# Patient Record
Sex: Female | Born: 1955 | Race: Black or African American | Hispanic: No | Marital: Married | Smoking: Current every day smoker
Health system: Southern US, Community
[De-identification: ages and names within clinical notes are randomized; demographics above are authoritative.]

## PROBLEM LIST (undated history)

## (undated) DIAGNOSIS — R112 Nausea with vomiting, unspecified: Secondary | ICD-10-CM

## (undated) DIAGNOSIS — T7840XA Allergy, unspecified, initial encounter: Secondary | ICD-10-CM

## (undated) DIAGNOSIS — E785 Hyperlipidemia, unspecified: Secondary | ICD-10-CM

## (undated) DIAGNOSIS — Z9889 Other specified postprocedural states: Secondary | ICD-10-CM

## (undated) DIAGNOSIS — M1990 Unspecified osteoarthritis, unspecified site: Secondary | ICD-10-CM

## (undated) DIAGNOSIS — I10 Essential (primary) hypertension: Secondary | ICD-10-CM

## (undated) HISTORY — DX: Essential (primary) hypertension: I10

## (undated) HISTORY — PX: TUBAL LIGATION: SHX77

## (undated) HISTORY — PX: DILATION AND CURETTAGE OF UTERUS: SHX78

## (undated) HISTORY — PX: JOINT REPLACEMENT: SHX530

## (undated) HISTORY — PX: REPLACEMENT TOTAL KNEE: SUR1224

## (undated) HISTORY — DX: Allergy, unspecified, initial encounter: T78.40XA

## (undated) HISTORY — DX: Hyperlipidemia, unspecified: E78.5

---

## 1989-02-16 HISTORY — PX: TOTAL THYROIDECTOMY: SHX2547

## 1997-11-10 DIAGNOSIS — D269 Other benign neoplasm of uterus, unspecified: Secondary | ICD-10-CM

## 2002-05-28 DIAGNOSIS — E7800 Pure hypercholesterolemia, unspecified: Secondary | ICD-10-CM

## 2004-04-24 ENCOUNTER — Ambulatory Visit: Payer: Self-pay

## 2004-06-13 ENCOUNTER — Ambulatory Visit: Payer: Self-pay | Admitting: Gastroenterology

## 2008-02-17 DIAGNOSIS — E669 Obesity, unspecified: Secondary | ICD-10-CM

## 2008-02-17 DIAGNOSIS — Z720 Tobacco use: Secondary | ICD-10-CM

## 2009-05-09 ENCOUNTER — Ambulatory Visit: Payer: Self-pay | Admitting: Orthopedic Surgery

## 2009-05-19 ENCOUNTER — Inpatient Hospital Stay: Payer: Self-pay | Admitting: Orthopedic Surgery

## 2009-11-09 DIAGNOSIS — E559 Vitamin D deficiency, unspecified: Secondary | ICD-10-CM | POA: Insufficient documentation

## 2011-02-07 ENCOUNTER — Emergency Department: Payer: Self-pay | Admitting: *Deleted

## 2011-02-22 ENCOUNTER — Ambulatory Visit: Payer: Self-pay | Admitting: Family Medicine

## 2012-03-13 ENCOUNTER — Ambulatory Visit: Payer: Self-pay | Admitting: Family Medicine

## 2012-03-13 LAB — HM PAP SMEAR: HM PAP: NEGATIVE

## 2012-04-02 ENCOUNTER — Ambulatory Visit: Payer: Self-pay | Admitting: Orthopedic Surgery

## 2012-04-29 ENCOUNTER — Encounter (HOSPITAL_COMMUNITY): Payer: Self-pay | Admitting: Pharmacy Technician

## 2012-04-29 ENCOUNTER — Other Ambulatory Visit: Payer: Self-pay | Admitting: Orthopedic Surgery

## 2012-05-01 ENCOUNTER — Encounter (HOSPITAL_COMMUNITY): Payer: Self-pay

## 2012-05-01 ENCOUNTER — Encounter (HOSPITAL_COMMUNITY)
Admission: RE | Admit: 2012-05-01 | Discharge: 2012-05-01 | Disposition: A | Discharge status: Home / Self Care | Payer: BC Managed Care – PPO | Source: Ambulatory Visit | Attending: Orthopedic Surgery | Admitting: Orthopedic Surgery

## 2012-05-01 HISTORY — DX: Nausea with vomiting, unspecified: R11.2

## 2012-05-01 HISTORY — DX: Unspecified osteoarthritis, unspecified site: M19.90

## 2012-05-01 HISTORY — DX: Other specified postprocedural states: Z98.890

## 2012-05-01 LAB — CBC WITH DIFFERENTIAL/PLATELET
Basophils Relative: 0 % (ref 0–1)
Hemoglobin: 14.1 g/dL (ref 12.0–15.0)
Lymphocytes Relative: 42 % (ref 12–46)
Lymphs Abs: 1.9 10*3/uL (ref 0.7–4.0)
MCHC: 34.1 g/dL (ref 30.0–36.0)
Monocytes Relative: 12 % (ref 3–12)
Neutro Abs: 1.9 10*3/uL (ref 1.7–7.7)
Neutrophils Relative %: 43 % (ref 43–77)
RBC: 4.44 MIL/uL (ref 3.87–5.11)
WBC: 4.5 10*3/uL (ref 4.0–10.5)

## 2012-05-01 LAB — URINALYSIS, ROUTINE W REFLEX MICROSCOPIC
Glucose, UA: NEGATIVE mg/dL
Hgb urine dipstick: NEGATIVE
Ketones, ur: NEGATIVE mg/dL
Leukocytes, UA: NEGATIVE
Protein, ur: NEGATIVE mg/dL

## 2012-05-01 LAB — BASIC METABOLIC PANEL
GFR calc Af Amer: >90 mL/min (ref >90)
GFR calc non Af Amer: >90 mL/min (ref >90)
Potassium: 3.9 mEq/L (ref 3.5–5.1)
Sodium: 139 mEq/L (ref 135–145)

## 2012-05-01 LAB — TYPE AND SCREEN: ABO/RH(D): B POS

## 2012-05-01 LAB — PROTIME-INR: INR: 1.03 (ref 0.00–1.49)

## 2012-05-01 LAB — SURGICAL PCR SCREEN
MRSA, PCR: NEGATIVE
Staphylococcus aureus: NEGATIVE

## 2012-05-01 LAB — APTT: aPTT: 38 seconds — ABNORMAL HIGH (ref 24–37)

## 2012-05-01 NOTE — Pre-Procedure Instructions (Signed)
20 Cathy Townsend  05/01/2012   Your procedure is scheduled on:  Monday, November 18th  Report to [facility] at 0800 AM.  Call this number if you have problems the morning of surgery: [phone]   Remember:   Do not eat food or drink:After Midnight.   Take these medicines the morning of surgery with A SIP OF WATER: vicodin if needed   Do not wear jewelry, make-up or nail polish.  Do not wear lotions, powders, or perfumes.  Do not shave 48 hours prior to surgery.   Do not bring valuables to the hospital.  Contacts, dentures or bridgework may not be worn into surgery.  Leave suitcase in the car. After surgery it may be brought to your room.  For patients admitted to the hospital, checkout time is 11:00 AM the day of discharge.   Patients discharged the day of surgery will not be allowed to drive home.   Special Instructions: Shower using CHG 2 nights before surgery and the night before surgery.  If you shower the day of surgery use CHG.  Use special wash - you have one bottle of CHG for all showers.  You should use approximately 1/3 of the bottle for each shower.   Please read over the following fact sheets that you were given: Pain Booklet, Coughing and Deep Breathing, Blood Transfusion Information, MRSA Information and Surgical Site Infection Prevention

## 2012-05-01 NOTE — H&P (Signed)
TOTAL KNEE REVISION ADMISSION H&P  Patient is being admitted for right revision total knee arthroplasty.  Subjective:  Chief Complaint:right knee pain.  HPI: Cathy Townsend, 56 y.o. female, has a history of pain and functional disability in the right knee(s) due to failed previous arthroplasty and patient has failed non-surgical conservative treatments for greater than 12 weeks to include NSAID's and/or analgesics, use of assistive devices and activity modification. The indications for the revision of the total knee arthroplasty are loosening of one or more components. Onset of symptoms was gradual starting several months ago with gradually worsening course since that time.  Prior procedures on the right knee(s) include arthroplasty.  Patient currently rates pain in the right knee(s) at 7 out of 10 with activity. There is worsening of pain with activity and weight bearing.  Patient has evidence of joint subluxation and prosthetic loosening by imaging studies. This condition presents safety issues increasing the risk of falls.  There is no current active infection.  There are no active problems to display for this patient.  No past medical history on file.  No past surgical history on file.  No prescriptions prior to admission   No Known Allergies  History  Substance Use Topics  . Smoking status: Not on file  . Smokeless tobacco: Not on file  . Alcohol Use: Not on file    No family history on file.    Review of Systems  Constitutional: Negative.   HENT: Negative.   Eyes: Negative.   Respiratory: Negative.   Cardiovascular: Negative.   Gastrointestinal: Negative.   Musculoskeletal: Positive for joint pain.  Skin: Negative.   Neurological: Negative.   Endo/Heme/Allergies: Negative.   Psychiatric/Behavioral: Negative.      Objective:  Physical Exam  Constitutional: She is oriented to person, place, and time. She appears well-developed and well-nourished.  HENT:  Head:  Normocephalic.  Eyes: Pupils are equal, round, and reactive to light.  Cardiovascular: Normal heart sounds.   Respiratory: Breath sounds normal.  Musculoskeletal:       Right knee: She exhibits decreased range of motion, effusion and abnormal alignment.  Neurological: She is alert and oriented to person, place, and time.  Psychiatric: She has a normal mood and affect.    Vital signs in last 24 hours:    Labs:  There is no height or weight on file to calculate BMI.  Imaging Review Plain radiographs demonstrate the overall alignment of the right knee is mild varus.There is evidence of loosening of the tibial components. The bone quality appears to be adequate for age and reported activity level.  Assessment/Plan:  End stage arthritis, right knee(s) with failed previous arthroplasty.   The patient history, physical examination, clinical judgment of the provider and imaging studies are consistent with end stage degenerative joint disease of the  Right knee(s), previous total knee arthroplasty. Revision total knee arthroplasty is deemed medically necessary. The treatment options including medical management, injection therapy, arthroscopy and revision arthroplasty were discussed at length. The risks and benefits of revision total knee arthroplasty were presented and reviewed. The risks due to aseptic loosening, infection, stiffness, patella tracking problems, thromboembolic complications and other imponderables were discussed. The patient acknowledged the explanation, agreed to proceed with the plan and consent was signed. Patient is being admitted for inpatient treatment for surgery, pain control, PT, OT, prophylactic antibiotics, VTE prophylaxis, progressive ambulation and ADL's and discharge planning.The patient is planning to be discharged home with home health services

## 2012-05-01 NOTE — Progress Notes (Signed)
Primary Physician - Dr. Huston Foley [facility] Does not have a cardiologist - No prior cardiac testing

## 2012-05-02 ENCOUNTER — Encounter (HOSPITAL_COMMUNITY): Payer: Self-pay | Admitting: Vascular Surgery

## 2012-05-02 NOTE — Consult Note (Signed)
Anesthesia chart review: Patient is a 56 year old female scheduled for right total knee revision by Dr. Turner Daniels on 05/05/2012. History includes smoking, obesity, postoperative nausea/vomiting, prior right total knee replacement '10, arthritis, prior thyroid surgery (unspecified).  She does take HCTZ PRN for LE edema.  Her PCP is Liane Comber, FNP at [facility], last visit 03/13/12 for CPE.  Preoperative labs noted.  Chest x-ray on 05/01/2012 showed no active disease with degenerative changes of the thoracic spine.  EKG on 05/01/2012 showed normal sinus rhythm, possible left atrial enlargement, possible anterior infarct, age undetermined. Her R. wave is lower in V3 otherwise her EKG does not appear significantly changed from 03/13/2012 (PCP).  No CV symptoms were documented at her PAT visit.  She has no known history of HTN, CAD, or DM. She'll be evaluated by her anesthesiologist on the day of surgery, but if she remains asymptomatic from a CV standpoint then  anticipate she can proceed as planned.  Shonna Chock, PA-C

## 2012-05-04 MED ORDER — CHLORHEXIDINE GLUCONATE 4 % EX LIQD: 60.0000 mL | Freq: Once | CUTANEOUS | Status: DC

## 2012-05-04 MED ORDER — DEXTROSE-NACL 5-0.45 % IV SOLN: INTRAVENOUS | Status: DC

## 2012-05-04 MED ORDER — CEFAZOLIN SODIUM-DEXTROSE 2-3 GM-% IV SOLR
2.0000 g | INTRAVENOUS | Status: AC
  Administered 2012-05-05: 2 g via INTRAVENOUS

## 2012-05-05 ENCOUNTER — Encounter (HOSPITAL_COMMUNITY): Payer: Self-pay | Admitting: Vascular Surgery

## 2012-05-05 ENCOUNTER — Inpatient Hospital Stay (HOSPITAL_COMMUNITY): Payer: BC Managed Care – PPO

## 2012-05-05 ENCOUNTER — Inpatient Hospital Stay (HOSPITAL_COMMUNITY)
Admission: RE | Admit: 2012-05-05 | Discharge: 2012-05-07 | DRG: 471 | Disposition: A | Discharge status: Home Health | Payer: BC Managed Care – PPO | Source: Ambulatory Visit | Attending: Orthopedic Surgery | Admitting: Orthopedic Surgery

## 2012-05-05 ENCOUNTER — Inpatient Hospital Stay (HOSPITAL_COMMUNITY): Payer: BC Managed Care – PPO | Admitting: Vascular Surgery

## 2012-05-05 ENCOUNTER — Encounter (HOSPITAL_COMMUNITY)
Admission: RE | Disposition: A | Discharge status: Home / Self Care | Payer: Self-pay | Source: Ambulatory Visit | Attending: Orthopedic Surgery

## 2012-05-05 DIAGNOSIS — M129 Arthropathy, unspecified: Secondary | ICD-10-CM | POA: Diagnosis present

## 2012-05-05 DIAGNOSIS — F17200 Nicotine dependence, unspecified, uncomplicated: Secondary | ICD-10-CM | POA: Diagnosis present

## 2012-05-05 DIAGNOSIS — T84039A Mechanical loosening of unspecified internal prosthetic joint, initial encounter: Principal | ICD-10-CM | POA: Diagnosis present

## 2012-05-05 DIAGNOSIS — Z01812 Encounter for preprocedural laboratory examination: Secondary | ICD-10-CM

## 2012-05-05 DIAGNOSIS — Z6982 Encounter for mental health services for perpetrator of other abuse: Secondary | ICD-10-CM

## 2012-05-05 DIAGNOSIS — Z96659 Presence of unspecified artificial knee joint: Secondary | ICD-10-CM

## 2012-05-05 DIAGNOSIS — T84012A Broken internal right knee prosthesis, initial encounter: Secondary | ICD-10-CM

## 2012-05-05 DIAGNOSIS — Y831 Surgical operation with implant of artificial internal device as the cause of abnormal reaction of the patient, or of later complication, without mention of misadventure at the time of the procedure: Secondary | ICD-10-CM | POA: Diagnosis present

## 2012-05-05 HISTORY — PX: TOTAL KNEE REVISION: SHX996

## 2012-05-05 SURGERY — TOTAL KNEE REVISION
Anesthesia: General | Site: Knee | Laterality: Right | Wound class: Clean

## 2012-05-05 MED ORDER — MAGNESIUM HYDROXIDE 400 MG/5ML PO SUSP: 30.0000 mL | Freq: Every day | ORAL | Status: DC | PRN

## 2012-05-05 MED ORDER — METOCLOPRAMIDE HCL 10 MG PO TABS: 5.0000 mg | ORAL_TABLET | Freq: Three times a day (TID) | ORAL | Status: DC | PRN

## 2012-05-05 MED ORDER — ACETAMINOPHEN 10 MG/ML IV SOLN
1000.0000 mg | Freq: Four times a day (QID) | INTRAVENOUS | Status: AC
  Administered 2012-05-05 – 2012-05-06 (×4): 1000 mg via INTRAVENOUS
  Filled 2012-05-05 (×4): qty 100

## 2012-05-05 MED ORDER — ONDANSETRON HCL 4 MG/2ML IJ SOLN
INTRAMUSCULAR | Status: DC | PRN
  Administered 2012-05-05 (×2): 4 mg via INTRAVENOUS

## 2012-05-05 MED ORDER — MIDAZOLAM HCL 2 MG/2ML IJ SOLN
INTRAMUSCULAR | Status: AC
  Filled 2012-05-05: qty 2

## 2012-05-05 MED ORDER — ONDANSETRON HCL 4 MG PO TABS: 4.0000 mg | ORAL_TABLET | Freq: Four times a day (QID) | ORAL | Status: DC | PRN

## 2012-05-05 MED ORDER — HYDROMORPHONE HCL PF 1 MG/ML IJ SOLN
INTRAMUSCULAR | Status: AC
  Filled 2012-05-05: qty 1

## 2012-05-05 MED ORDER — MEPERIDINE HCL 25 MG/ML IJ SOLN: 6.2500 mg | INTRAMUSCULAR | Status: DC | PRN

## 2012-05-05 MED ORDER — LACTATED RINGERS IV SOLN
INTRAVENOUS | Status: DC
  Administered 2012-05-05: 09:00:00 via INTRAVENOUS

## 2012-05-05 MED ORDER — ACETAMINOPHEN 325 MG PO TABS: 650.0000 mg | ORAL_TABLET | Freq: Four times a day (QID) | ORAL | Status: DC | PRN

## 2012-05-05 MED ORDER — LACTATED RINGERS IV SOLN
INTRAVENOUS | Status: DC | PRN
  Administered 2012-05-05 (×3): via INTRAVENOUS

## 2012-05-05 MED ORDER — MIDAZOLAM HCL 2 MG/2ML IJ SOLN: 0.5000 mg | Freq: Once | INTRAMUSCULAR | Status: DC | PRN

## 2012-05-05 MED ORDER — BISACODYL 5 MG PO TBEC: 5.0000 mg | DELAYED_RELEASE_TABLET | Freq: Every day | ORAL | Status: DC | PRN

## 2012-05-05 MED ORDER — ZOLPIDEM TARTRATE 5 MG PO TABS: 5.0000 mg | ORAL_TABLET | Freq: Every evening | ORAL | Status: DC | PRN

## 2012-05-05 MED ORDER — ACETAMINOPHEN 650 MG RE SUPP: 650.0000 mg | Freq: Four times a day (QID) | RECTAL | Status: DC | PRN

## 2012-05-05 MED ORDER — CELECOXIB 200 MG PO CAPS
200.0000 mg | ORAL_CAPSULE | Freq: Two times a day (BID) | ORAL | Status: DC
  Administered 2012-05-05 – 2012-05-07 (×4): 200 mg via ORAL
  Filled 2012-05-05 (×6): qty 1

## 2012-05-05 MED ORDER — MIDAZOLAM HCL 2 MG/2ML IJ SOLN
1.0000 mg | INTRAMUSCULAR | Status: DC | PRN
  Administered 2012-05-05: 1 mg via INTRAVENOUS

## 2012-05-05 MED ORDER — CEFUROXIME SODIUM 1.5 G IJ SOLR
INTRAMUSCULAR | Status: DC | PRN
  Administered 2012-05-05: 1.5 g

## 2012-05-05 MED ORDER — PHENYLEPHRINE HCL 10 MG/ML IJ SOLN
INTRAMUSCULAR | Status: DC | PRN
  Administered 2012-05-05 (×2): 80 ug via INTRAVENOUS

## 2012-05-05 MED ORDER — NEOSTIGMINE METHYLSULFATE 1 MG/ML IJ SOLN
INTRAMUSCULAR | Status: DC | PRN
  Administered 2012-05-05: 4 mg via INTRAVENOUS

## 2012-05-05 MED ORDER — METOCLOPRAMIDE HCL 5 MG/ML IJ SOLN: 5.0000 mg | Freq: Three times a day (TID) | INTRAMUSCULAR | Status: DC | PRN

## 2012-05-05 MED ORDER — ROCURONIUM BROMIDE 100 MG/10ML IV SOLN
INTRAVENOUS | Status: DC | PRN
  Administered 2012-05-05: 50 mg via INTRAVENOUS

## 2012-05-05 MED ORDER — HYDROMORPHONE HCL PF 1 MG/ML IJ SOLN
0.2500 mg | INTRAMUSCULAR | Status: DC | PRN
  Administered 2012-05-05 (×4): 0.5 mg via INTRAVENOUS

## 2012-05-05 MED ORDER — MENTHOL 3 MG MT LOZG: 1.0000 | LOZENGE | OROMUCOSAL | Status: DC | PRN

## 2012-05-05 MED ORDER — FLEET ENEMA 7-19 GM/118ML RE ENEM: 1.0000 | ENEMA | Freq: Once | RECTAL | Status: AC | PRN

## 2012-05-05 MED ORDER — FENTANYL CITRATE 0.05 MG/ML IJ SOLN
INTRAMUSCULAR | Status: AC
  Filled 2012-05-05: qty 2

## 2012-05-05 MED ORDER — METHOCARBAMOL 100 MG/ML IJ SOLN
500.0000 mg | Freq: Four times a day (QID) | INTRAVENOUS | Status: DC | PRN
  Filled 2012-05-05: qty 5

## 2012-05-05 MED ORDER — ASPIRIN EC 325 MG PO TBEC
325.0000 mg | DELAYED_RELEASE_TABLET | Freq: Two times a day (BID) | ORAL | Status: DC
  Administered 2012-05-05 – 2012-05-07 (×4): 325 mg via ORAL
  Filled 2012-05-05 (×5): qty 1

## 2012-05-05 MED ORDER — GLYCOPYRROLATE 0.2 MG/ML IJ SOLN
INTRAMUSCULAR | Status: DC | PRN
  Administered 2012-05-05: 0.6 mg via INTRAVENOUS

## 2012-05-05 MED ORDER — BUPIVACAINE-EPINEPHRINE PF 0.5-1:200000 % IJ SOLN
INTRAMUSCULAR | Status: DC | PRN
  Administered 2012-05-05: 30 mL

## 2012-05-05 MED ORDER — DIPHENHYDRAMINE HCL 12.5 MG/5ML PO ELIX
12.5000 mg | ORAL_SOLUTION | ORAL | Status: DC | PRN
  Administered 2012-05-05: 25 mg via ORAL
  Administered 2012-05-06: 12.5 mg via ORAL
  Administered 2012-05-06 – 2012-05-07 (×3): 25 mg via ORAL
  Filled 2012-05-05 (×5): qty 5

## 2012-05-05 MED ORDER — OXYCODONE HCL 5 MG PO TABS
5.0000 mg | ORAL_TABLET | ORAL | Status: DC | PRN
  Administered 2012-05-05 – 2012-05-07 (×8): 10 mg via ORAL
  Filled 2012-05-05 (×8): qty 2

## 2012-05-05 MED ORDER — ARTIFICIAL TEARS OP OINT
TOPICAL_OINTMENT | OPHTHALMIC | Status: DC | PRN
  Administered 2012-05-05: 1 via OPHTHALMIC

## 2012-05-05 MED ORDER — OXYCODONE HCL 5 MG/5ML PO SOLN: 5.0000 mg | Freq: Once | ORAL | Status: DC | PRN

## 2012-05-05 MED ORDER — FENTANYL CITRATE 0.05 MG/ML IJ SOLN
50.0000 ug | INTRAMUSCULAR | Status: DC | PRN
  Administered 2012-05-05: 100 ug via INTRAVENOUS

## 2012-05-05 MED ORDER — PROPOFOL 10 MG/ML IV BOLUS
INTRAVENOUS | Status: DC | PRN
  Administered 2012-05-05: 200 mg via INTRAVENOUS

## 2012-05-05 MED ORDER — CEFUROXIME SODIUM 1.5 G IJ SOLR
INTRAMUSCULAR | Status: AC
  Filled 2012-05-05: qty 1.5

## 2012-05-05 MED ORDER — ALUM & MAG HYDROXIDE-SIMETH 200-200-20 MG/5ML PO SUSP: 30.0000 mL | ORAL | Status: DC | PRN

## 2012-05-05 MED ORDER — FENTANYL CITRATE 0.05 MG/ML IJ SOLN
INTRAMUSCULAR | Status: DC | PRN
  Administered 2012-05-05 (×3): 50 ug via INTRAVENOUS
  Administered 2012-05-05: 150 ug via INTRAVENOUS
  Administered 2012-05-05: 50 ug via INTRAVENOUS
  Administered 2012-05-05: 100 ug via INTRAVENOUS
  Administered 2012-05-05: 50 ug via INTRAVENOUS

## 2012-05-05 MED ORDER — LIDOCAINE HCL (CARDIAC) 20 MG/ML IV SOLN: INTRAVENOUS | Status: DC | PRN

## 2012-05-05 MED ORDER — ERGOCALCIFEROL 1.25 MG (50000 UT) PO CAPS: 50000.0000 [IU] | ORAL_CAPSULE | ORAL | Status: DC

## 2012-05-05 MED ORDER — VITAMIN D (ERGOCALCIFEROL) 1.25 MG (50000 UNIT) PO CAPS: 50000.0000 [IU] | ORAL_CAPSULE | ORAL | Status: DC

## 2012-05-05 MED ORDER — PROMETHAZINE HCL 25 MG/ML IJ SOLN: 6.2500 mg | INTRAMUSCULAR | Status: DC | PRN

## 2012-05-05 MED ORDER — ONDANSETRON HCL 4 MG/2ML IJ SOLN: 4.0000 mg | Freq: Four times a day (QID) | INTRAMUSCULAR | Status: DC | PRN

## 2012-05-05 MED ORDER — METHOCARBAMOL 500 MG PO TABS
500.0000 mg | ORAL_TABLET | Freq: Four times a day (QID) | ORAL | Status: DC | PRN
  Administered 2012-05-05 – 2012-05-07 (×5): 500 mg via ORAL
  Filled 2012-05-05 (×5): qty 1

## 2012-05-05 MED ORDER — HYDROMORPHONE HCL PF 1 MG/ML IJ SOLN
1.0000 mg | INTRAMUSCULAR | Status: DC | PRN
  Administered 2012-05-05 – 2012-05-06 (×3): 1 mg via INTRAVENOUS
  Filled 2012-05-05 (×3): qty 1

## 2012-05-05 MED ORDER — HYDROCHLOROTHIAZIDE 12.5 MG PO CAPS
12.5000 mg | ORAL_CAPSULE | Freq: Every day | ORAL | Status: DC | PRN
  Filled 2012-05-05: qty 1

## 2012-05-05 MED ORDER — KCL IN DEXTROSE-NACL 20-5-0.45 MEQ/L-%-% IV SOLN
INTRAVENOUS | Status: DC
  Administered 2012-05-06: 1000 mL via INTRAVENOUS
  Filled 2012-05-05 (×8): qty 1000

## 2012-05-05 MED ORDER — SODIUM CHLORIDE 0.9 % IR SOLN
Status: DC | PRN
  Administered 2012-05-05: 3000 mL

## 2012-05-05 MED ORDER — LIDOCAINE HCL (CARDIAC) 20 MG/ML IV SOLN
INTRAVENOUS | Status: DC | PRN
  Administered 2012-05-05: 30 mg via INTRAVENOUS

## 2012-05-05 MED ORDER — OXYCODONE HCL 5 MG PO TABS: 5.0000 mg | ORAL_TABLET | Freq: Once | ORAL | Status: DC | PRN

## 2012-05-05 MED ORDER — DEXAMETHASONE SODIUM PHOSPHATE 4 MG/ML IJ SOLN
INTRAMUSCULAR | Status: DC | PRN
  Administered 2012-05-05: 4 mg via INTRAVENOUS

## 2012-05-05 MED ORDER — PHENOL 1.4 % MT LIQD: 1.0000 | OROMUCOSAL | Status: DC | PRN

## 2012-05-05 SURGICAL SUPPLY — 62 items
BANDAGE ELASTIC 4 VELCRO ST LF (GAUZE/BANDAGES/DRESSINGS) ×2 IMPLANT
BANDAGE ELASTIC 6 VELCRO ST LF (GAUZE/BANDAGES/DRESSINGS) ×2 IMPLANT
BANDAGE ESMARK 6X9 LF (GAUZE/BANDAGES/DRESSINGS) ×1 IMPLANT
BLADE SAG 18X100X1.27 (BLADE) ×2 IMPLANT
BLADE SAW SAG 90X13X1.27 (BLADE) ×2 IMPLANT
BLADE SURG ROTATE 9660 (MISCELLANEOUS) IMPLANT
BNDG ESMARK 6X9 LF (GAUZE/BANDAGES/DRESSINGS) ×2
BOWL SMART MIX CTS (DISPOSABLE) ×2 IMPLANT
CEMENT HV SMART SET (Cement) ×2 IMPLANT
CLOTH BEACON ORANGE TIMEOUT ST (SAFETY) ×2 IMPLANT
COVER BACK TABLE 24X17X13 BIG (DRAPES) IMPLANT
COVER SURGICAL LIGHT HANDLE (MISCELLANEOUS) ×2 IMPLANT
CUFF TOURNIQUET SINGLE 34IN LL (TOURNIQUET CUFF) ×2 IMPLANT
CUFF TOURNIQUET SINGLE 44IN (TOURNIQUET CUFF) IMPLANT
DISC DIAMOND MED (BURR) IMPLANT
DRAPE EXTREMITY T 121X128X90 (DRAPE) ×2 IMPLANT
DRAPE U-SHAPE 47X51 STRL (DRAPES) ×2 IMPLANT
DURAPREP 26ML APPLICATOR (WOUND CARE) ×2 IMPLANT
ELECT REM PT RETURN 9FT ADLT (ELECTROSURGICAL) ×2
ELECTRODE REM PT RTRN 9FT ADLT (ELECTROSURGICAL) ×1 IMPLANT
EVACUATOR 1/8 PVC DRAIN (DRAIN) ×2 IMPLANT
GAUZE XEROFORM 1X8 LF (GAUZE/BANDAGES/DRESSINGS) ×2 IMPLANT
GLOVE BIO SURGEON STRL SZ7 (GLOVE) ×2 IMPLANT
GLOVE BIO SURGEON STRL SZ7.5 (GLOVE) ×2 IMPLANT
GLOVE BIOGEL PI IND STRL 7.0 (GLOVE) ×1 IMPLANT
GLOVE BIOGEL PI IND STRL 8 (GLOVE) ×1 IMPLANT
GLOVE BIOGEL PI INDICATOR 7.0 (GLOVE) ×1
GLOVE BIOGEL PI INDICATOR 8 (GLOVE) ×1
GOWN PREVENTION PLUS XLARGE (GOWN DISPOSABLE) ×4 IMPLANT
GOWN STRL NON-REIN LRG LVL3 (GOWN DISPOSABLE) ×2 IMPLANT
HANDPIECE INTERPULSE COAX TIP (DISPOSABLE) ×1
HOOD PEEL AWAY FACE SHEILD DIS (HOOD) ×6 IMPLANT
INSERT LCL COMP RP STD 17.5MM (Knees) ×2 IMPLANT
KIT BASIN OR (CUSTOM PROCEDURE TRAY) ×2 IMPLANT
KIT ROOM TURNOVER OR (KITS) ×2 IMPLANT
MANIFOLD NEPTUNE II (INSTRUMENTS) ×2 IMPLANT
NS IRRIG 1000ML POUR BTL (IV SOLUTION) ×2 IMPLANT
PACK TOTAL JOINT (CUSTOM PROCEDURE TRAY) ×2 IMPLANT
PAD ARMBOARD 7.5X6 YLW CONV (MISCELLANEOUS) ×4 IMPLANT
PAD CAST 4YDX4 CTTN HI CHSV (CAST SUPPLIES) ×1 IMPLANT
PADDING CAST COTTON 4X4 STRL (CAST SUPPLIES) ×1
PADDING CAST COTTON 6X4 STRL (CAST SUPPLIES) ×2 IMPLANT
PEG LCS COM 3 MB STD (Knees) ×2 IMPLANT
RASP HELIOCORDIAL MED (MISCELLANEOUS) IMPLANT
SET HNDPC FAN SPRY TIP SCT (DISPOSABLE) ×1 IMPLANT
SLEEVE MBT PROUS ML 29MM (Knees) ×2 IMPLANT
SPONGE GAUZE 4X4 12PLY (GAUZE/BANDAGES/DRESSINGS) ×2 IMPLANT
STAPLER VISISTAT 35W (STAPLE) ×2 IMPLANT
STEM UNIVERSAL 150X12MM (Stem) ×2 IMPLANT
SUCTION FRAZIER TIP 10 FR DISP (SUCTIONS) ×2 IMPLANT
SUT VIC AB 0 CT1 27 (SUTURE) ×1
SUT VIC AB 0 CT1 27XBRD ANBCTR (SUTURE) ×1 IMPLANT
SUT VIC AB 1 CTX 36 (SUTURE) ×2
SUT VIC AB 1 CTX36XBRD ANBCTR (SUTURE) ×2 IMPLANT
SUT VIC AB 2-0 CT1 27 (SUTURE) ×2
SUT VIC AB 2-0 CT1 TAPERPNT 27 (SUTURE) ×2 IMPLANT
TOWEL OR 17X24 6PK STRL BLUE (TOWEL DISPOSABLE) ×2 IMPLANT
TOWEL OR 17X26 10 PK STRL BLUE (TOWEL DISPOSABLE) ×2 IMPLANT
TRAY FOLEY CATH 14FR (SET/KITS/TRAYS/PACK) ×2 IMPLANT
TRAY REVISION SZ 3 (Knees) ×2 IMPLANT
TUBE ANAEROBIC SPECIMEN COL (MISCELLANEOUS) IMPLANT
WATER STERILE IRR 1000ML POUR (IV SOLUTION) ×6 IMPLANT

## 2012-05-05 NOTE — Transfer of Care (Signed)
Immediate Anesthesia Transfer of Care Note  [name]  Procedure(s) Performed: Procedure(s) (LRB) with comments: TOTAL KNEE REVISION (Right)  Patient Location: PACU  Anesthesia Type:General  Level of Consciousness: awake, alert , oriented and sedated  Airway & Oxygen Therapy: Patient Spontanous Breathing and Patient connected to nasal cannula oxygen  Post-op Assessment: Report given to PACU RN, Post -op Vital signs reviewed and stable and Patient moving all extremities  Post vital signs: Reviewed and stable  Complications: No apparent anesthesia complications

## 2012-05-05 NOTE — Interval H&P Note (Signed)
History and Physical Interval Note:  05/05/2012 9:36 AM  Cathy Townsend  has presented today for surgery, with the diagnosis of LOOSE RIGHT TKA  The various methods of treatment have been discussed with the patient and family. After consideration of risks, benefits and other options for treatment, the patient has consented to  Procedure(s) (LRB) with comments: TOTAL KNEE REVISION (Right) as a surgical intervention .  The patient's history has been reviewed, patient examined, no change in status, stable for surgery.  I have reviewed the patient's chart and labs.  Questions were answered to the patient's satisfaction.     Nestor Lewandowsky

## 2012-05-05 NOTE — Op Note (Signed)
PATIENT ID:      Cathy Townsend  MRN:     [id number] DOB/AGE:    1955/09/12 / 56 y.o.       OPERATIVE REPORT    DATE OF PROCEDURE:  05/05/2012       PREOPERATIVE DIAGNOSIS:   LOOSE TIBIAL IMPLANT RIGHT TKA      There is no height or weight on file to calculate BMI.                                                        POSTOPERATIVE DIAGNOSIS:   LOOSE TIBIAL IMPLANT AND WARNED PATELLAR COMPONENT RIGHT TKA                                                                      PROCEDURE:  Procedure(s): TOTAL KNEE REVISION Using Depuy Sigma RP implants #3 MBT Tibia, 29 mm proximal tibial sleeve, 12 x 1 50 stem, 17.5 mm LCS bearing, new #3 patella     SURGEON: WUJWJ,XBJYN J    ASSISTANT:   Shirl Harris PA-C   (Present and scrubbed throughout the case, critical for assistance with exposure, retraction, instrumentation, and closure.)         ANESTHESIA: GET with Femoral Nerve Block  DRAINS: foley, 2 medium hemovac in knee   TOURNIQUET TIME:   COMPLICATIONS:  None     SPECIMENS: None   INDICATIONS FOR PROCEDURE: The patient has  LOOSE TIBIAL COMPONENT RIGHT DEPUY LCS TKA placed by another surgeon 3 years ago in [facility], extreme valgus deformities, XR shows gross loosening and subsidence of the tibial component into valgus and posterior slope. Patient has severe unremitting pain and increasing instability of the right knee. To prevent collapse of the knee and diminished pain revision has been discussed with the patient..  Risks and benefits of surgery have been discussed, questions answered.   DESCRIPTION OF PROCEDURE: The patient identified by armband, received  right femoral nerve block and IV antibiotics, in the holding area at [facility] Patient taken to the operating room, appropriate anesthetic  monitors were attached General endotracheal anesthesia induced with  the patient in supine position, Foley catheter was inserted. Tourniquet  applied high  to the operative thigh. Lateral post and foot positioner  applied to the table, the lower extremity was then prepped and draped  in usual sterile fashion from the ankle to the tourniquet. Time-out procedure was performed. The limb was wrapped with an Esmarch bandage and the tourniquet inflated to 350 mmHg. We began the operation by making the anterior midline incision starting at handbreadth above the patella going over the patella 1 cm medial to and  4 cm distal to the tibial tubercle. We excised the previous scar as well. Small bleeders in the skin and the  subcutaneous tissue identified and cauterized. Transverse retinaculum was incised and reflected medially and a medial parapatellar arthrotomy was accomplished. the patella was everted and theprepatellar fat pad resected. The superficial medial collateral  ligament was then elevated from anterior to posterior along the proximal  flare  of the tibia. The knee was hyperflexed exposing the intact femoral component and grossly loose tibial component.. Peripheral and notch osteophytes were then resected. We continued to  work our way around posteriorly along the proximal tibia, and externally  rotated the tibia subluxing it out from underneath the femur. The tibial bearing was removed without difficulty and the tibial implant itself which had subsided into valgus and posterior slope was also removed without difficulty. He spent a good deal of time removing loose bits of cement and outlining the bony defect posteriorly and laterally that would require a proximal metaphyseal sleeve. A McHale  retractor was placed through the notch and a lateral Hohmann retractor  placed, and we then entered the canal of the tibia and reamed up to a 12 mm reamer by hand to full depth for 150 mm stem. With a 12 mm reamer in place we resected to 3 mm of bone anteriorly leaving a 15 mm defect posterior laterally. We then size or #3 MBT tray and through the trial tray which was  pinned into place placed the conical reamer. We then broached up to a 29 mm broach with a 150 x 12 mm trial stem and assembled a trial implant with a 3 MBT 29 sleeve and a 12 x 150 stem.the trial was hammered into place and we size with 12.5, 15 and 17.5 mm LCS bearing obtaining the best stability with the 17.5 millimeter bearing. The knee was through range of motion from 0-130 degrees. No thumb pressure was required for patellar  tracking. At this point, all trial components were removed, as well as was the worn patellar bearing. At the back table we assembled the tibial implant with a #3 MBT tray, a 29 mm sleeve, and a 150 mm x 12 mm stem. A double batch of DePuy HV cement with 1500 mg of Zinacef was mixed and applied to the metaphyseal sleeve, the tibial base plate and the proximal tibia. The tibial component was then hammered into place and excess cement removed. A new patellar button #3 was then snapped into place on the metal backing. While the cement cured the wound was irrigated out with normal saline solution pulse lavage, and medium Hemovac drains were placed from an anterolateral  approach. Ligament stability and patellar tracking were checked and found to be excellent. The parapatellar arthrotomy was closed with  running #1 Vicryl suture. The subcutaneous tissue with 0 and 2-0 undyed  Vicryl suture, and the skin with skin staples. A dressing of Xeroform,  4 x 4, dressing sponges, Webril, and Ace wrap applied. The patient  awakened, extubated, and taken to recovery room without difficulty.   Gean Birchwood J 05/05/2012, 12:54 PM

## 2012-05-05 NOTE — Evaluation (Signed)
Physical Therapy Evaluation Patient Details Name: Cathy Townsend MRN: [id number] DOB: 1955-09-22 Today's Date: 05/05/2012 Time: 3086-5784 PT Time Calculation (min): 26 min  PT Assessment / Plan / Recommendation Clinical Impression  Patient is a 56 yo female s/p Rt. TKA.  Did very well with mobility on first day.  Patient will benefit from acute PT to maximize independence prior to discharging to mother's home.    PT Assessment  Patient needs continued PT services    Follow Up Recommendations  Home health PT;Supervision/Assistance - 24 hour    Does the patient have the potential to tolerate intense rehabilitation      Barriers to Discharge None      Equipment Recommendations  None recommended by PT    Recommendations for Other Services     Frequency 7X/week    Precautions / Restrictions Precautions Precautions: Knee Precaution Booklet Issued: Yes (comment) Precaution Comments: Reviewed precautions with patient. Restrictions Weight Bearing Restrictions: Yes RLE Weight Bearing: Weight bearing as tolerated   Pertinent Vitals/Pain       Mobility  Bed Mobility Bed Mobility: Supine to Sit;Sit to Supine Supine to Sit: 4: Min assist;With rails;HOB elevated Sit to Supine: 4: Min assist;With rail;HOB elevated Details for Bed Mobility Assistance: Verbal cues for technique.  Assist to move RLE off of and onto bed. Transfers Transfers: Sit to Stand;Stand to Sit Sit to Stand: 4: Min assist;With upper extremity assist;From bed Stand to Sit: 4: Min guard;With upper extremity assist;To bed Details for Transfer Assistance: Verbal cues for hand placement.  Educated patient on WBAT status. Ambulation/Gait Ambulation/Gait Assistance: 4: Min assist Ambulation Distance (Feet): 22 Feet Assistive device: Rolling walker Ambulation/Gait Assistance Details: Verbal cues for safe use of RW, gait sequence, and to look forward during gait.  Verbal cues to keep RW out in front of her (stepping  too far into RW).   Gait Pattern: Step-to pattern;Decreased stance time - right;Decreased step length - left;Antalgic;Trunk flexed Gait velocity: Slow gait speed       Exercises Total Joint Exercises Ankle Circles/Pumps: AROM;Both;10 reps;Supine Quad Sets: AROM;Right;5 reps;Supine   PT Diagnosis: Difficulty walking;Abnormality of gait;Acute pain  PT Problem List: Decreased strength;Decreased range of motion;Decreased activity tolerance;Decreased mobility;Decreased knowledge of use of DME;Decreased knowledge of precautions;Pain PT Treatment Interventions: DME instruction;Gait training;Stair training;Functional mobility training;Therapeutic exercise;Patient/family education   PT Goals Acute Rehab PT Goals PT Goal Formulation: With patient/family Time For Goal Achievement: 05/12/12 Potential to Achieve Goals: Good Pt will go Supine/Side to Sit: Independently;with HOB 0 degrees PT Goal: Supine/Side to Sit - Progress: Goal set today Pt will go Sit to Supine/Side: Independently;with HOB 0 degrees PT Goal: Sit to Supine/Side - Progress: Goal set today Pt will go Sit to Stand: with supervision;with upper extremity assist PT Goal: Sit to Stand - Progress: Goal set today Pt will go Stand to Sit: with supervision;with upper extremity assist PT Goal: Stand to Sit - Progress: Goal set today Pt will Ambulate: 51 - 150 feet;with supervision;with rolling walker PT Goal: Ambulate - Progress: Goal set today Pt will Go Up / Down Stairs: 1-2 stairs;with min assist;with rail(s);with least restrictive assistive device PT Goal: Up/Down Stairs - Progress: Goal set today Pt will Perform Home Exercise Program: with supervision, verbal cues required/provided PT Goal: Perform Home Exercise Program - Progress: Goal set today  Visit Information  Last PT Received On: 05/05/12 Assistance Needed: +1    Subjective Data  Subjective: "I want to do whatever it takes to get home" Patient Stated Goal: To  walk and go  home.   Prior Functioning  Home Living Lives With: Spouse Available Help at Discharge: Family;Available 24 hours/day (Will stay with mother at discharge) Type of Home: House Home Access: Stairs to enter [employer] of Steps: 2 Entrance Stairs-Rails: Right;Left Home Layout: One level Bathroom Shower/Tub: Engineer, manufacturing systems: Standard Bathroom Accessibility: Yes How Accessible: Accessible via walker Home Adaptive Equipment: Bedside commode/3-in-1;Walker - rolling Prior Function Level of Independence: Independent Able to Take Stairs?: Yes Driving: Yes Vocation: Full time employment Communication Communication: No difficulties    Cognition  Overall Cognitive Status: Appears within functional limits for tasks assessed/performed Arousal/Alertness: Awake/alert Orientation Level: Oriented X4 / Intact Behavior During Session: [facility] for tasks performed    Extremity/Trunk Assessment Right Upper Extremity Assessment RUE ROM/Strength/Tone: WFL for tasks assessed RUE Sensation: WFL - Light Touch Left Upper Extremity Assessment LUE ROM/Strength/Tone: WFL for tasks assessed LUE Sensation: WFL - Light Touch Right Lower Extremity Assessment RLE ROM/Strength/Tone: Deficits;Unable to fully assess;Due to pain RLE ROM/Strength/Tone Deficits: Able to move RLE against gravity. Left Lower Extremity Assessment LLE ROM/Strength/Tone: WFL for tasks assessed LLE Sensation: WFL - Light Touch Trunk Assessment Trunk Assessment: Normal   Balance    End of Session PT - End of Session Equipment Utilized During Treatment: Gait belt Activity Tolerance: Patient tolerated treatment well;Patient limited by pain Patient left: in bed;with call bell/phone within reach;with family/visitor present Nurse Communication: Mobility status  GP     Vena Austria 05/05/2012, 7:45 PM Durenda Hurt. Renaldo Fiddler, [facility] Pager [phone]

## 2012-05-05 NOTE — Anesthesia Postprocedure Evaluation (Signed)
  Anesthesia Post-op Note  [name]  Procedure(s) Performed: Procedure(s) (LRB) with comments: TOTAL KNEE REVISION (Right)  Patient Location: PACU  Anesthesia Type:GA combined with regional for post-op pain  Level of Consciousness: awake, alert  and oriented  Airway and Oxygen Therapy: Patient Spontanous Breathing and Patient connected to nasal cannula oxygen  Post-op Pain: mild  Post-op Assessment: Post-op Vital signs reviewed, Patient's Cardiovascular Status Stable, Respiratory Function Stable, Patent Airway, No signs of Nausea or vomiting and Pain level controlled  Post-op Vital Signs: Reviewed and stable  Complications: No apparent anesthesia complications

## 2012-05-05 NOTE — Anesthesia Procedure Notes (Addendum)
Anesthesia Regional Block:  Femoral nerve block  Pre-Anesthetic Checklist: ,, timeout performed, Correct Patient, Correct Site, Correct Laterality, Correct Procedure, Correct Position, site marked, Risks and benefits discussed,  Surgical consent,  Pre-op evaluation,  At surgeon's request and post-op pain management  Laterality: Right  Prep: chloraprep       Needles:  Injection technique: Single-shot  Needle Type: Stimulator Needle - 40     Needle Length: 4cm  Needle Gauge: 22 and 22 G    Additional Needles:  Procedures: nerve stimulator Femoral nerve block  Nerve Stimulator or Paresthesia:  Response: patellar twitch, 0.45 mA, 0.1 ms,   Additional Responses:   Narrative:  Start time: 05/05/2012 9:48 AM End time: 05/05/2012 9:53 AM Injection made incrementally with aspirations every 5 mL.  Performed by: Personally  Anesthesiologist: Sandford Craze, MD  Additional Notes: Pt identified in Holding room.  Monitors applied. Working IV access confirmed. Sterile prep R groin.  #22ga PNS to patellar twitch at 0.50mA threshold.  30cc 0.5% Bupivacaine with 1:200k epi injected incrementally after negative test dose.  Patient asymptomatic, VSS, no heme aspirated, tolerated well.   Sandford Craze, MD  Femoral nerve block Procedure Name: Intubation Date/Time: 05/05/2012 10:50 AM Performed by: Fransisca Kaufmann Pre-anesthesia Checklist: Patient identified, Timeout performed, Emergency Drugs available, Suction available and Patient being monitored Patient Re-evaluated:Patient Re-evaluated prior to inductionOxygen Delivery Method: Circle system utilized Preoxygenation: Pre-oxygenation with 100% oxygen Intubation Type: IV induction Ventilation: Mask ventilation without difficulty Laryngoscope Size: Mac and 3 Grade View: Grade I Tube type: Oral Tube size: 7.5 mm Number of attempts: 1 Airway Equipment and Method: Stylet Placement Confirmation: positive ETCO2 and CO2 detector Secured at: 22  cm Tube secured with: Tape Dental Injury: Teeth and Oropharynx as per pre-operative assessment  Comments: Intubated by America Brown EMT student with Dr Edison Pace assistance.

## 2012-05-05 NOTE — Anesthesia Preprocedure Evaluation (Signed)
Anesthesia Evaluation  Patient identified by MRN, date of birth, ID band Patient awake    Reviewed: Allergy & Precautions, H&P , NPO status , Patient's Chart, lab work & pertinent test results  History of Anesthesia Complications (+) PONV  Airway Mallampati: II TM Distance: >3 FB Neck ROM: Full    Dental  (+) Dental Advisory Given, Partial Lower, Partial Upper and Missing   Pulmonary Current Smoker,  breath sounds clear to auscultation  Pulmonary exam normal       Cardiovascular negative cardio ROS  Rhythm:Regular Rate:Normal     Neuro/Psych negative neurological ROS  negative psych ROS   GI/Hepatic negative GI ROS, Neg liver ROS,   Endo/Other  Morbid obesity  Renal/GU negative Renal ROS     Musculoskeletal  (+) Arthritis -,   Abdominal (+) + obese,   Peds  Hematology   Anesthesia Other Findings   Reproductive/Obstetrics                           Anesthesia Physical Anesthesia Plan  ASA: II  Anesthesia Plan: General   Post-op Pain Management:    Induction: Intravenous  Airway Management Planned: Oral ETT  Additional Equipment:   Intra-op Plan:   Post-operative Plan:   Informed Consent: I have reviewed the patients History and Physical, chart, labs and discussed the procedure including the risks, benefits and alternatives for the proposed anesthesia with the patient or authorized representative who has indicated his/her understanding and acceptance.   Dental advisory given  Plan Discussed with: CRNA and Surgeon  Anesthesia Plan Comments: (Plan routine monitors, GETA with femoral nerve block for post op analgesia)        Anesthesia Quick Evaluation

## 2012-05-05 NOTE — Preoperative (Signed)
Beta Blockers   Reason not to administer Beta Blockers:Not Applicable 

## 2012-05-06 ENCOUNTER — Encounter (HOSPITAL_COMMUNITY): Payer: Self-pay | Admitting: Orthopedic Surgery

## 2012-05-06 LAB — CBC
HCT: 34.6 % — ABNORMAL LOW (ref 36.0–46.0)
MCV: 93.5 fL (ref 78.0–100.0)
RBC: 3.7 MIL/uL — ABNORMAL LOW (ref 3.87–5.11)
WBC: 6.8 10*3/uL (ref 4.0–10.5)

## 2012-05-06 LAB — BASIC METABOLIC PANEL
BUN: 8 mg/dL (ref 6–23)
CO2: 29 mEq/L (ref 19–32)
Chloride: 101 mEq/L (ref 96–112)
Creatinine, Ser: 0.39 mg/dL — ABNORMAL LOW (ref 0.50–1.10)
Potassium: 3.5 mEq/L (ref 3.5–5.1)

## 2012-05-06 NOTE — Progress Notes (Signed)
Patient ID: Marena Chancy, female   DOB: 1955-07-17, 56 y.o.   MRN: [id number] PATIENT ID: TEEGHAN INGMIRE  MRN: [id number]  DOB/AGE:  02/27/1956 / 56 y.o.  1 Day Post-Op Procedure(s) (LRB): TOTAL KNEE REVISION (Right)    PROGRESS NOTE Subjective: Patient is alert, oriented, no Nausea, no Vomiting, yes passing gas, no Bowel Movement. Taking PO well. Denies SOB, Chest or Calf Pain. Using Incentive Spirometer, PAS in place. Ambulate WBAT, CPM none complex revision Patient reports pain as 6 on 0-10 scale  .    Objective: Vital signs in last 24 hours: Filed Vitals:   05/05/12 1600 05/05/12 2000 05/05/12 2321 05/06/12 0317  BP:   134/65 128/62  Pulse:   82 78  Temp:   98.3 F (36.8 C) 98.7 F (37.1 C)  TempSrc:   Oral Oral  Resp: 13 18 16 16   SpO2: 99% 100% 99% 98%      Intake/Output from previous day: I/O last 3 completed shifts: In: 2700 [I.V.:2700] Out: 675 [Urine:585; Drains:90]   Intake/Output this shift:     LABORATORY DATA:  Basename 05/06/12 0455  WBC 6.8  HGB 11.3*  HCT 34.6*  PLT 312  NA 138  K 3.5  CL 101  CO2 29  BUN 8  CREATININE 0.39*  GLUCOSE 116*  GLUCAP --  INR --  CALCIUM 9.1    Examination: Neurologically intact ABD soft Neurovascular intact Sensation intact distally Intact pulses distally Dorsiflexion/Plantar flexion intact Incision: scant drainage No cellulitis present Compartment soft} Blood and plasma separated in drain indicating minimal recent drainage, drain pulled without difficulty.  Assessment:   1 Day Post-Op Procedure(s) (LRB): TOTAL KNEE REVISION (Right) ADDITIONAL DIAGNOSIS:    Plan: PT/OT WBAT, CPM 5/hrs day until ROM 0-90 degrees, then D/C CPM DVT Prophylaxis:  SCDx72hrs, ASA 325 mg BID x 2 weeks DISCHARGE PLAN: Home DISCHARGE NEEDS: HHPT, HHRN, CPM, Walker and 3-in-1 comode seat     Davin Muramoto J 05/06/2012, 7:15 AM

## 2012-05-06 NOTE — Progress Notes (Signed)
Physical Therapy Treatment Patient Details Name: Cathy Townsend MRN: [id number] DOB: 1955-09-08 Today's Date: 05/06/2012 Time: 4098-1191 PT Time Calculation (min): 27 min  PT Assessment / Plan / Recommendation Comments on Treatment Session  Patient progressing well. On target for discharge on POD #1 according to goals set by evaluating therapist. Patient able to tolerate increase ambulation and stair training without difficulty.     Follow Up Recommendations  Home health PT;Supervision/Assistance - 24 hour     Does the patient have the potential to tolerate intense rehabilitation     Barriers to Discharge        Equipment Recommendations  None recommended by PT    Recommendations for Other Services    Frequency 7X/week   Plan Discharge plan remains appropriate;Frequency remains appropriate    Precautions / Restrictions Restrictions RLE Weight Bearing: Weight bearing as tolerated   Pertinent Vitals/Pain     Mobility  Bed Mobility Bed Mobility: Not assessed Transfers Sit to Stand: 4: Min guard;With upper extremity assist;From chair/3-in-1 Stand to Sit: 4: Min guard;With upper extremity assist;To chair/3-in-1 Details for Transfer Assistance: Patient stood x3 with MinGuard for safety. No cues required Ambulation/Gait Ambulation/Gait Assistance: 4: Min guard Ambulation Distance (Feet): 250 Feet Assistive device: Rolling walker Ambulation/Gait Assistance Details: Cues for step length and posture.  Gait Pattern: Step-through pattern;Decreased stride length;Trunk flexed Stairs: Yes Stairs Assistance: 4: Min guard Stair Management Technique: Two rails;Step to pattern;Forwards Number of Stairs: 2     Exercises Total Joint Exercises Quad Sets: AROM;Right;15 reps Heel Slides: AAROM;Right;15 reps Hip ABduction/ADduction: AAROM;Right;15 reps Straight Leg Raises: AAROM;Right;10 reps   PT Diagnosis:    PT Problem List:   PT Treatment Interventions:     PT Goals Acute  Rehab PT Goals PT Goal: Sit to Stand - Progress: Progressing toward goal PT Goal: Stand to Sit - Progress: Progressing toward goal PT Goal: Ambulate - Progress: Progressing toward goal PT Goal: Up/Down Stairs - Progress: Met PT Goal: Perform Home Exercise Program - Progress: Progressing toward goal  Visit Information  Last PT Received On: 05/06/12 Assistance Needed: +1    Subjective Data      Cognition  Overall Cognitive Status: Appears within functional limits for tasks assessed/performed Arousal/Alertness: Awake/alert Orientation Level: Appears intact for tasks assessed Behavior During Session: [place] for tasks performed    Balance     End of Session PT - End of Session Equipment Utilized During Treatment: Gait belt Activity Tolerance: Patient tolerated treatment well Patient left: in chair;with call bell/phone within reach   GP     Fredrich Birks 05/06/2012, 8:29 AM  05/06/2012 Fredrich Birks PTA [phone] pager [phone] office

## 2012-05-06 NOTE — Progress Notes (Signed)
Physical Therapy Treatment Patient Details Name: Cathy Townsend MRN: [id number] DOB: [birth year] Today's Date: 05/06/2012 Time: 1308-6578 PT Time Calculation (min): 24 min  PT Assessment / Plan / Recommendation Comments on Treatment Session  Patient continuing to progress well. May need to practive steps prior to DC    Follow Up Recommendations  Home health PT;Supervision/Assistance - 24 hour     Does the patient have the potential to tolerate intense rehabilitation     Barriers to Discharge        Equipment Recommendations  None recommended by OT;None recommended by PT    Recommendations for Other Services    Frequency 7X/week   Plan Discharge plan remains appropriate;Frequency remains appropriate    Precautions / Restrictions Precautions Precautions: Knee   Pertinent Vitals/Pain     Mobility  Bed Mobility Supine to Sit: 6: Modified independent (Device/Increase time) Sit to Supine: 6: Modified independent (Device/Increase time) Transfers Sit to Stand: 6: Modified independent (Device/Increase time) Stand to Sit: 6: Modified independent (Device/Increase time) Ambulation/Gait Ambulation/Gait Assistance: 5: Supervision Ambulation Distance (Feet): 200 Feet Assistive device: Rolling walker Ambulation/Gait Assistance Details: Cues for posture Gait Pattern: Step-through pattern;Decreased stride length    Exercises Total Joint Exercises Ankle Circles/Pumps: AROM;Both;10 reps Quad Sets: AROM;Right;15 reps Heel Slides: AAROM;Right;15 reps Hip ABduction/ADduction: AAROM;Right;15 reps Straight Leg Raises: AAROM;Right;15 reps   PT Diagnosis:    PT Problem List:   PT Treatment Interventions:     PT Goals Acute Rehab PT Goals PT Goal: Sit to Stand - Progress: Progressing toward goal PT Goal: Stand to Sit - Progress: Progressing toward goal PT Goal: Ambulate - Progress: Progressing toward goal PT Goal: Perform Home Exercise Program - Progress: Progressing toward  goal  Visit Information  Last PT Received On: 05/06/12 Assistance Needed: +1    Subjective Data      Cognition  Overall Cognitive Status: Appears within functional limits for tasks assessed/performed Arousal/Alertness: Awake/alert Orientation Level: Appears intact for tasks assessed Behavior During Session: [facility] for tasks performed    Balance     End of Session PT - End of Session Equipment Utilized During Treatment: Gait belt Activity Tolerance: Patient tolerated treatment well Patient left: in chair;with call bell/phone within reach Nurse Communication: Mobility status   GP     Fredrich Birks 05/06/2012, 2:35 PM 05/06/2012 Fredrich Birks PTA [phone] pager [phone] office

## 2012-05-06 NOTE — Progress Notes (Signed)
CARE MANAGEMENT NOTE 05/06/2012  Patient:  Cathy Townsend, Cathy Townsend   Account Number:  [id number]  Date Initiated:  05/05/2012  Documentation initiated by:  Vance Peper  Subjective/Objective Assessment:   56 yr old female s/p right total knee revision.     Action/Plan:   CM spoke with patient concerning home health and DME needs at discharge. Choice offered. Patient has rolling walker and 3in1.   Anticipated DC Date:  05/07/2012   Anticipated DC Plan:  HOME W HOME HEALTH SERVICES      DC Planning Services  CM consult      [facility] Choice  HOME HEALTH   Choice offered to / List presented to:  C-1 Patient        HH arranged  HH-2 PT      [facility] agency  [facility]   Status of service:  Completed, signed off Medicare Important Message given?   (If response is "NO", the following Medicare IM given date fields will be blank) Date Medicare IM given:   Date Additional Medicare IM given:    Discharge Disposition:  HOME W HOME HEALTH SERVICES  Per UR Regulation:    If discussed at Long Length of Stay Meetings, dates discussed:    Comments:

## 2012-05-06 NOTE — Evaluation (Signed)
Occupational Therapy Evaluation Patient Details Name: Cathy Townsend MRN: [id number] DOB: [birth year] Today's Date: 05/06/2012 Time: 7829-5621 OT Time Calculation (min): 16 min  OT Assessment / Plan / Recommendation Clinical Impression  56 yo female s/p Rt TKA that does not require skilled OT. OT to sign off. ALl equipment needs met at this time    OT Assessment  Patient does not need any further OT services    Follow Up Recommendations  No OT follow up    Barriers to Discharge      Equipment Recommendations  None recommended by PT;None recommended by OT    Recommendations for Other Services    Frequency       Precautions / Restrictions Precautions Precautions: Knee Precaution Booklet Issued: Yes (comment) Restrictions RLE Weight Bearing: Weight bearing as tolerated   Pertinent Vitals/Pain Minimal pain reported Pt progressing well at this time    ADL  Toilet Transfer: Supervision/safety Toilet Transfer Method: Sit to Barista: Regular height toilet Tub/Shower Transfer: Supervision/safety;Minimal assistance Tub/Shower Transfer Method: Science writer: Counsellor Used: Rolling walker;Gait belt Transfers/Ambulation Related to ADLs: Pt ambulated to tub completed positioning with RW Supervision level ADL Comments: pt completed tub transfer sequence 100% correct. Pt required (A) to  lift Rt operated leg up into tub and back out of tub. pt could complete tub transfer at supervision level once able to do single leg lift with Rt LE. pt has mother to (A) with dressing. Pt with no further questions and previous knee operation. ALl education completed    OT Diagnosis:    OT Problem List:   OT Treatment Interventions:     OT Goals    Visit Information  Last OT Received On: 05/06/12 Assistance Needed: +1    Subjective Data  Subjective: "I had the other one done before"- pt with second knee surg Patient  Stated Goal: to go home    Prior Functioning     Home Living Lives With: Spouse Available Help at Discharge: Family;Available 24 hours/day Type of Home: House Home Access: Stairs to enter [employer] of Steps: 2 Entrance Stairs-Rails: Right;Left Home Layout: One level Bathroom Shower/Tub: Engineer, manufacturing systems: Standard Bathroom Accessibility: Yes How Accessible: Accessible via walker Home Adaptive Equipment: Bedside commode/3-in-1;Walker - rolling Additional Comments: pt is borrowing equipment from mothers house (3n1 and shower bench) Prior Function Level of Independence: Independent Able to Take Stairs?: Yes Driving: Yes Vocation: Full time employment Communication Communication: No difficulties Dominant Hand: Right         Vision/Perception     Cognition  Overall Cognitive Status: Appears within functional limits for tasks assessed/performed Arousal/Alertness: Awake/alert Orientation Level: Appears intact for tasks assessed Behavior During Session: [facility] for tasks performed    Extremity/Trunk Assessment Right Upper Extremity Assessment RUE ROM/Strength/Tone: [facility] for tasks assessed Left Upper Extremity Assessment LUE ROM/Strength/Tone: WFL for tasks assessed Trunk Assessment Trunk Assessment: Normal     Mobility Bed Mobility Bed Mobility: Not assessed Transfers Transfers: Sit to Stand;Stand to Sit Sit to Stand: 4: Min guard Stand to Sit: 4: Min guard Details for Transfer Assistance: good hand placement for transfers     Shoulder Instructions        Balance     End of Session OT - End of Session Activity Tolerance: Patient tolerated treatment well Patient left:  (with PTA for stairs) Nurse Communication: Mobility status  GO     Lucile Shutters 05/06/2012, 8:56 AM Pager: [phone]

## 2012-05-07 DIAGNOSIS — T84012A Broken internal right knee prosthesis, initial encounter: Secondary | ICD-10-CM

## 2012-05-07 LAB — CBC
MCHC: 32.9 g/dL (ref 30.0–36.0)
Platelets: 282 10*3/uL (ref 150–400)
RDW: 13.4 % (ref 11.5–15.5)

## 2012-05-07 MED ORDER — OXYCODONE-ACETAMINOPHEN 5-325 MG PO TABS: 1.0000 | ORAL_TABLET | ORAL | Status: DC | PRN

## 2012-05-07 MED ORDER — ASPIRIN 325 MG PO TBEC: 325.0000 mg | DELAYED_RELEASE_TABLET | Freq: Two times a day (BID) | ORAL | Status: DC

## 2012-05-07 MED ORDER — METHOCARBAMOL 500 MG PO TABS: 500.0000 mg | ORAL_TABLET | Freq: Four times a day (QID) | ORAL | Status: DC | PRN

## 2012-05-07 NOTE — Progress Notes (Signed)
PATIENT ID: Cathy Townsend  MRN: [id number]  DOB/AGE:  1955/10/02 / 56 y.o.  2 Days Post-Op Procedure(s) (LRB): TOTAL KNEE REVISION (Right)    PROGRESS NOTE Subjective: Patient is alert, oriented, no Nausea, no Vomiting, yes passing gas, no Bowel Movement. Taking PO well. Denies SOB, Chest or Calf Pain. Using Incentive Spirometer, PAS in place. Ambulating well with PT., Patient reports pain as moderate  .    Objective: Vital signs in last 24 hours: Filed Vitals:   05/06/12 0317 05/06/12 1400 05/06/12 2317 05/07/12 0651  BP: 128/62 123/56 123/60 124/71  Pulse: 78 75 80 85  Temp: 98.7 F (37.1 C) 99.5 F (37.5 C) 98.9 F (37.2 C) 98.6 F (37 C)  TempSrc: Oral     Resp: 16 16 16 16   SpO2: 98% 99% 100% 100%      Intake/Output from previous day: I/O last 3 completed shifts: In: 1440 [P.O.:1440] Out: 650 [Urine:600; Drains:50]   Intake/Output this shift:     LABORATORY DATA:  Basename 05/07/12 0505 05/06/12 0455  WBC 7.4 6.8  HGB 10.5* 11.3*  HCT 31.9* 34.6*  PLT 282 312  NA -- 138  K -- 3.5  CL -- 101  CO2 -- 29  BUN -- 8  CREATININE -- 0.39*  GLUCOSE -- 116*  GLUCAP -- --  INR -- --  CALCIUM -- 9.1    Examination: Neurologically intact ABD soft Neurovascular intact Sensation intact distally Intact pulses distally Dorsiflexion/Plantar flexion intact Incision: dressing C/D/I}  Assessment:   2 Days Post-Op Procedure(s) (LRB): TOTAL KNEE REVISION (Right) ADDITIONAL DIAGNOSIS:  none  Plan: PT/OT WBAT,  DVT Prophylaxis:  SCDx72hrs, ASA 325 mg BID x 2 weeks DISCHARGE PLAN: Home today DISCHARGE NEEDS: HHPT, HHRN, Walker and 3-in-1 comode seat (No CPM)     Lakhia Gengler M. 05/07/2012, 7:52 AM

## 2012-05-07 NOTE — Progress Notes (Signed)
PT Progress Note   05/07/12 0900  PT Visit Information  Last PT Received On 05/07/12  Assistance Needed +1  PT Time Calculation  PT Start Time 0829  PT Stop Time 0859  PT Time Calculation (min) 30 min  Subjective Data  Subjective "I'm ready to get home"  Patient Stated Goal To walk and go home.  Precautions  Precautions Knee  Precaution Comments Reviewed precautions with patient.  Restrictions  Weight Bearing Restrictions Yes  RLE Weight Bearing WBAT  Cognition  Overall Cognitive Status Appears within functional limits for tasks assessed/performed  Arousal/Alertness Awake/alert  Orientation Level Appears intact for tasks assessed  Behavior During Session [facility] for tasks performed  Bed Mobility  Bed Mobility Not assessed  Details for Bed Mobility Assistance Pt. stated she is not have any difficulty with getting in/OOB  Transfers  Transfers Sit to Stand;Stand to Sit  Sit to Stand 6: Modified independent (Device/Increase time);With upper extremity assist;With armrests;From chair/3-in-1  Stand to Sit 6: Modified independent (Device/Increase time);With upper extremity assist;With armrests;To chair/3-in-1  Details for Transfer Assistance Pt. Mod I with getting in/out of chair stepping to the RW. Pt.   Ambulation/Gait  Ambulation/Gait Assistance 5: Supervision  Ambulation Distance (Feet) 300 Feet  Assistive device Rolling walker  Ambulation/Gait Assistance Details Pt. given VC for keeping her hands on the RW. Pt. with steady ambulation.  Gait Pattern Step-through pattern;Decreased stride length  Gait velocity Slow gait speed  Stairs Yes  Stairs Assistance 5: Supervision  Stairs Assistance Details (indicate cue type and reason) Pt. supervision with ascent/descent of stairs. Pt. used both rails to mimic the set up at her home. Pt given VC for technique and recited "up with good/down with bad" sequence as she performed that stairs.  Stair Management Technique Two rails;Step to  pattern;Forwards  Number of Stairs 3  (2 trials)  Wheelchair Mobility  Wheelchair Mobility No  Exercises  Exercises Total Joint  Total Joint Exercises  Ankle Circles/Pumps AROM;Both;10 reps;Seated  Quad Sets AROM;Right;Other reps (comment);Seated (12)  Heel Slides AAROM;Right;5 reps;Seated  Hip ABduction/ADduction AAROM;Right;Other reps (comment) (12)  Straight Leg Raises AAROM;Right;Other reps (comment);Seated (12)  PT - End of Session  Equipment Utilized During Treatment Gait belt  Activity Tolerance Patient tolerated treatment well  Patient left in chair;with call bell/phone within reach  Nurse Communication Mobility status;Other (comment) (Pt. concern with tingling/numb sensation in her Rt. LE)  PT - Assessment/Plan  Comments on Treatment Session Pt presents to be moving well with ambulation and on stairs. Pt stated that she was anticipating discharge today and ready to get home. Pt. demonstrated her HEP and total joint exercises with no VC for technique.   PT Plan Discharge plan remains appropriate;Frequency remains appropriate  PT Frequency 7X/week  Follow Up Recommendations Home health PT;Supervision/Assistance - 24 hour  Equipment Recommended None recommended by OT;None recommended by PT  Acute Rehab PT Goals  PT Goal Formulation With patient/family  Time For Goal Achievement 05/12/12  Potential to Achieve Goals Good  Pt will go Supine/Side to Sit Independently;with HOB 0 degrees  Pt will go Sit to Supine/Side Independently;with HOB 0 degrees  Pt will go Sit to Stand with supervision;with upper extremity assist  PT Goal: Sit to Stand - Progress Met  Pt will go Stand to Sit with supervision;with upper extremity assist  PT Goal: Stand to Sit - Progress Met  Pt will Ambulate 51 - 150 feet;with supervision;with rolling walker  PT Goal: Ambulate - Progress Met  Pt will Go Up /  Down Stairs 1-2 stairs;with min assist;with rail(s);with least restrictive assistive device  PT  Goal: Up/Down Stairs - Progress Met  Pt will Perform Home Exercise Program with supervision, verbal cues required/provided  PT Goal: Perform Home Exercise Program - Progress Progressing toward goal    Mertie Clause, SPTA

## 2012-05-07 NOTE — Discharge Summary (Signed)
Patient ID: Cathy Townsend MRN: [id number] DOB/AGE: [birth year] 56 y.o.  Admit date: 05/05/2012 Discharge date: 05/07/2012  Admission Diagnoses:  Principal Problem:  *Failed total knee, right   Discharge Diagnoses:  Same  Past Medical History  Diagnosis Date  . PONV (postoperative nausea and vomiting)   . Arthritis     "think I have it in both knees" (05-30-12)    Surgeries: Procedure(s): TOTAL KNEE REVISION on 05/05/2012   Consultants:    Discharged Condition: Improved  Hospital Course: Cathy Townsend is an 56 y.o. female who was admitted 05/05/2012 for operative treatment ofFailed total knee, right. Patient has severe unremitting pain that affects sleep, daily activities, and work/hobbies. After pre-op clearance the patient was taken to the operating room on 05/05/2012 and underwent  Procedure(s): TOTAL KNEE REVISION.    Patient was given perioperative antibiotics: Anti-infectives     Start     Dose/Rate Route Frequency Ordered Stop   05/05/12 1141   cefUROXime (ZINACEF) injection  Status:  Discontinued          As needed 05/05/12 1142 05/05/12 1320   05/04/12 1520   ceFAZolin (ANCEF) IVPB 2 g/50 mL premix        2 g 100 mL/hr over 30 Minutes Intravenous 60 min pre-op 05/04/12 1520 05/05/12 1052           Patient was given sequential compression devices, early ambulation, and chemoprophylaxis to prevent DVT.  Patient benefited maximally from hospital stay and there were no complications.    Recent vital signs: Patient Vitals for the past 24 hrs:  BP Temp Pulse Resp SpO2  05/07/12 0651 124/71 mmHg 98.6 F (37 C) 85  16  100 %  05-30-2012 2317 123/60 mmHg 98.9 F (37.2 C) 80  16  100 %  2012-05-30 1400 123/56 mmHg 99.5 F (37.5 C) 75  16  99 %     Recent laboratory studies:  Basename 05/07/12 0505 05/30/12 0455  WBC 7.4 6.8  HGB 10.5* 11.3*  HCT 31.9* 34.6*  PLT 282 312  NA -- 138  K -- 3.5  CL -- 101  CO2 -- 29  BUN -- 8  CREATININE -- 0.39*    GLUCOSE -- 116*  INR -- --  CALCIUM -- 9.1     Discharge Medications:     Medication List     As of 05/07/2012  7:55 AM    STOP taking these medications         HYDROcodone-acetaminophen 5-325 MG per tablet   Commonly known as: NORCO/VICODIN      TAKE these medications         aspirin 325 MG EC tablet   Take 1 tablet (325 mg total) by mouth 2 (two) times daily.      ergocalciferol 50000 UNITS capsule   Commonly known as: VITAMIN D2   Take 50,000 Units by mouth once a week.      hydrochlorothiazide 12.5 MG capsule   Commonly known as: MICROZIDE   Take 12.5 mg by mouth daily as needed. For fluid retention      meloxicam 15 MG tablet   Commonly known as: MOBIC   Take 15 mg by mouth daily as needed. For pain      methocarbamol 500 MG tablet   Commonly known as: ROBAXIN   Take 1 tablet (500 mg total) by mouth every 6 (six) hours as needed.      oxyCODONE-acetaminophen 5-325 MG per tablet   Commonly known as:  PERCOCET/ROXICET   Take 1-2 tablets by mouth every 4 (four) hours as needed for pain.        Diagnostic Studies: Dg Chest 2 View  05/01/2012  *RADIOLOGY REPORT*  Clinical Data: Preop for right knee surgery  CHEST - 2 VIEW  Comparison: None.  Findings: Cardiomediastinal silhouette is unremarkable.  No acute infiltrate or pleural effusion.  No pulmonary edema.  Degenerative changes of thoracic spine are noted.  IMPRESSION: No active disease.  Degenerative changes thoracic spine.   Original Report Authenticated By: Natasha Mead, M.D.    Dg Knee Right Port  05/05/2012  *RADIOLOGY REPORT*  Clinical Data: Right knee replacement  PORTABLE RIGHT KNEE - 1-2 VIEW  Comparison: None.  Findings: Right knee arthroplasty has been performed.  Anterior staples noted.  Surgical drains in place.  Soft tissue swelling and subcutaneous air noted.  Normal alignment .  No osseous abnormality or visualized fracture.  IMPRESSION: Immediate postop right knee arthroplasty.  No acute finding by  plain radiography.   Original Report Authenticated By: Judie Petit. Miles Costain, M.D.     Disposition: Final discharge disposition not confirmed      Discharge Orders    Future Orders Please Complete By Expires   Increase activity slowly      Walker       May shower / Bathe      Change dressing (specify)      Comments:   Dressing change as needed.   Call MD for:  temperature >100.4      Call MD for:  severe uncontrolled pain      Call MD for:  redness, tenderness, or signs of infection (pain, swelling, redness, odor or green/yellow discharge around incision site)      Discharge instructions      Comments:   F/U with Dr. Turner Daniels as scheduled.   Driving Restrictions      Comments:   No driving for 2 weeks.         SignedHazle Nordmann. 05/07/2012, 7:55 AM

## 2012-05-08 NOTE — Progress Notes (Signed)
Dorene Bruni, PTA [phone] 05/08/2012  

## 2012-05-27 ENCOUNTER — Encounter: Payer: Self-pay | Admitting: Orthopedic Surgery

## 2012-06-18 ENCOUNTER — Encounter: Payer: Self-pay | Admitting: Orthopedic Surgery

## 2013-03-17 ENCOUNTER — Ambulatory Visit: Payer: Self-pay | Admitting: Family Medicine

## 2014-06-03 ENCOUNTER — Ambulatory Visit: Payer: Self-pay | Admitting: Family Medicine

## 2014-07-12 ENCOUNTER — Ambulatory Visit: Payer: Self-pay | Admitting: Family Medicine

## 2014-11-18 ENCOUNTER — Other Ambulatory Visit: Payer: Self-pay | Admitting: Orthopedic Surgery

## 2014-11-18 DIAGNOSIS — M1712 Unilateral primary osteoarthritis, left knee: Secondary | ICD-10-CM

## 2014-11-25 ENCOUNTER — Ambulatory Visit: Payer: Self-pay

## 2014-11-25 ENCOUNTER — Ambulatory Visit
Admission: RE | Admit: 2014-11-25 | Discharge: 2014-11-25 | Disposition: A | Discharge status: Home / Self Care | Payer: BLUE CROSS/BLUE SHIELD | Source: Ambulatory Visit | Attending: Orthopedic Surgery | Admitting: Orthopedic Surgery

## 2014-11-25 DIAGNOSIS — M1712 Unilateral primary osteoarthritis, left knee: Secondary | ICD-10-CM | POA: Diagnosis present

## 2014-11-25 DIAGNOSIS — Z01818 Encounter for other preprocedural examination: Secondary | ICD-10-CM | POA: Diagnosis not present

## 2015-01-10 ENCOUNTER — Other Ambulatory Visit: Payer: Self-pay | Admitting: Family Medicine

## 2015-01-11 NOTE — Telephone Encounter (Signed)
Last ov was on 10/07/2014

## 2015-01-24 ENCOUNTER — Other Ambulatory Visit: Payer: Self-pay

## 2015-01-24 DIAGNOSIS — R011 Cardiac murmur, unspecified: Secondary | ICD-10-CM | POA: Insufficient documentation

## 2015-01-27 ENCOUNTER — Encounter
Admission: RE | Admit: 2015-01-27 | Discharge: 2015-01-27 | Disposition: A | Discharge status: Home / Self Care | Payer: BLUE CROSS/BLUE SHIELD | Source: Ambulatory Visit | Attending: Orthopedic Surgery | Admitting: Orthopedic Surgery

## 2015-01-27 DIAGNOSIS — Z01810 Encounter for preprocedural cardiovascular examination: Secondary | ICD-10-CM | POA: Diagnosis present

## 2015-01-27 DIAGNOSIS — Z01812 Encounter for preprocedural laboratory examination: Secondary | ICD-10-CM | POA: Insufficient documentation

## 2015-01-27 LAB — BASIC METABOLIC PANEL
Anion gap: 9 (ref 5–15)
BUN: 13 mg/dL (ref 6–20)
CALCIUM: 9.4 mg/dL (ref 8.9–10.3)
CO2: 27 mmol/L (ref 22–32)
Chloride: 102 mmol/L (ref 101–111)
Creatinine, Ser: 0.53 mg/dL (ref 0.44–1.00)
GFR calc Af Amer: >60 mL/min (ref >60)
GLUCOSE: 96 mg/dL (ref 65–99)
Potassium: 3.1 mmol/L — ABNORMAL LOW (ref 3.5–5.1)
SODIUM: 138 mmol/L (ref 135–145)

## 2015-01-27 LAB — URINALYSIS COMPLETE WITH MICROSCOPIC (ARMC ONLY)
BACTERIA UA: NONE SEEN
BILIRUBIN URINE: NEGATIVE
Glucose, UA: NEGATIVE mg/dL
HGB URINE DIPSTICK: NEGATIVE
Ketones, ur: NEGATIVE mg/dL
LEUKOCYTES UA: NEGATIVE
NITRITE: NEGATIVE
Protein, ur: NEGATIVE mg/dL
Specific Gravity, Urine: 1.015 (ref 1.005–1.030)
pH: 6 (ref 5.0–8.0)

## 2015-01-27 LAB — CBC
HEMATOCRIT: 38.9 % (ref 35.0–47.0)
HEMOGLOBIN: 13.1 g/dL (ref 12.0–16.0)
MCH: 31.7 pg (ref 26.0–34.0)
MCHC: 33.7 g/dL (ref 32.0–36.0)
MCV: 94.2 fL (ref 80.0–100.0)
Platelets: 290 10*3/uL (ref 150–440)
RBC: 4.12 MIL/uL (ref 3.80–5.20)
RDW: 13.4 % (ref 11.5–14.5)
WBC: 4.3 10*3/uL (ref 3.6–11.0)

## 2015-01-27 LAB — ABO/RH: ABO/RH(D): B POS

## 2015-01-27 LAB — TYPE AND SCREEN
ABO/RH(D): B POS
Antibody Screen: NEGATIVE

## 2015-01-27 LAB — SURGICAL PCR SCREEN
MRSA, PCR: NEGATIVE
Staphylococcus aureus: NEGATIVE

## 2015-01-27 LAB — APTT: APTT: 31 s (ref 24–36)

## 2015-01-27 LAB — PROTIME-INR
INR: 0.95
PROTHROMBIN TIME: 12.9 s (ref 11.4–15.0)

## 2015-01-27 LAB — SEDIMENTATION RATE: SED RATE: 16 mm/h (ref 0–30)

## 2015-01-27 NOTE — Pre-Procedure Instructions (Signed)
K+ 3.1, results called to [facility], [facility], ortho)  and instructed that patient will need a potassium supplement prior to surgery.

## 2015-01-27 NOTE — Patient Instructions (Signed)
  Your procedure is scheduled on: February 10, 2015 (Thursday  Report to Day Surgery. To find out your arrival time please call [phone] between 1PM - 3PM on February 09, 2015 (Wednesday)  Remember: Instructions that are not followed completely may result in serious medical risk, up to and including death, or upon the discretion of your surgeon and anesthesiologist your surgery may need to be rescheduled.    x____ 1. Do not eat food or drink liquids after midnight. No gum chewing or hard candies.     x__ 2. No Alcohol for 24 hours before or after surgery.   ____ 3. Bring all medications with you on the day of surgery if instructed.    x___ 4. Notify your doctor if there is any change in your medical condition     (cold, fever, infections).     Do not wear jewelry, make-up, hairpins, clips or nail polish.  Do not wear lotions, powders, or perfumes. You may wear deodorant.  Do not shave 48 hours prior to surgery. Men may shave face and neck.  Do not bring valuables to the hospital.    [facility] is not responsible for any belongings or valuables.               Contacts, dentures or bridgework may not be worn into surgery.  Leave your suitcase in the car. After surgery it may be brought to your room.  For patients admitted to the hospital, discharge time is determined by your                treatment team.   Patients discharged the day of surgery will not be allowed to drive home.   Please read over the following fact sheets that you were given:   MRSA Information and Surgical Site Infection Prevention   ____ Take these medicines the morning of surgery with A SIP OF WATER:    1. Singulair  2.   3.   4.  5.  6.  ____ Fleet Enema (as directed)   _x___ Use CHG Soap as directed  ____ Use inhalers on the day of surgery  ____ Stop metformin 2 days prior to surgery    ____ Take 1/2 of usual insulin dose the night before surgery and none on the morning of surgery.   _x_ Stop  Coumadin/Plavix/aspirin on (STOP ASPIRIN SEVEN DAYS PRIOR TO SURGERY) _x___ Stop Anti-inflammatories on (STOP IBUPROFEN SEVEN DAYS PRIOR TO SURGERY)   ____ Stop supplements until after surgery.    ____ Bring C-Pap to the hospital.

## 2015-01-29 LAB — URINE CULTURE

## 2015-01-31 ENCOUNTER — Encounter: Payer: Self-pay | Admitting: Physician Assistant

## 2015-01-31 ENCOUNTER — Ambulatory Visit (INDEPENDENT_AMBULATORY_CARE_PROVIDER_SITE_OTHER): Payer: BLUE CROSS/BLUE SHIELD | Admitting: Physician Assistant

## 2015-01-31 VITALS — BP 118/66 | HR 70 | Temp 98.3°F | Resp 16 | Ht 62.0 in | Wt 221.0 lb

## 2015-01-31 DIAGNOSIS — R059 Cough, unspecified: Secondary | ICD-10-CM

## 2015-01-31 DIAGNOSIS — Z01818 Encounter for other preprocedural examination: Secondary | ICD-10-CM | POA: Diagnosis not present

## 2015-01-31 DIAGNOSIS — R05 Cough: Secondary | ICD-10-CM | POA: Diagnosis not present

## 2015-01-31 DIAGNOSIS — J40 Bronchitis, not specified as acute or chronic: Secondary | ICD-10-CM

## 2015-01-31 MED ORDER — HYDROCODONE-HOMATROPINE 5-1.5 MG/5ML PO SYRP: 5.0000 mL | ORAL_SOLUTION | Freq: Three times a day (TID) | ORAL | Status: DC | PRN

## 2015-01-31 MED ORDER — AZITHROMYCIN 250 MG PO TABS: ORAL_TABLET | ORAL | Status: DC

## 2015-01-31 NOTE — Progress Notes (Signed)
Patient ID: Cathy Townsend, female   DOB: 1955/08/18, 59 y.o.   MRN: [id number]       [name] Female    DOB: 04/01/1956   59 y.o.   MRN: [id number] Visit Date: 01/31/2015  Today's Provider: Mar Daring, PA-C   Chief Complaint  Patient presents with  . Pre-op Exam   Subjective:    HPI  Surgical Clearance:  The procedure scheduled is left knee replacement on 02/10/2015. The surgeon for the procedure will be Dr. Rudene Christians. The chief complaint is knee pain and swelling. Recent symptoms do not include fever, chest pain, or shortness of breath. The patient's last health maintenance visit was on 04/06/2014. Patient's medical history does not include prior anesthesia, diabetes, pulmonary disease, clotting disorder or bleeding disorder. Pertinent family history does not include anesthesia reaction, MI, stroke, or sudden death. Pertinent social history does  include aspirin use, tobacco use, and alcohol use. After surgery the patient plans to recover at home with her mother. Patient reports she had a EKG done at [facility] on 01/27/2015.  Patient reports she has cough for about one week. Patient reports that her cough is about the same during the day and night.   Allergies  Allergen Reactions  . Oxycodone-Acetaminophen Itching  . Meloxicam Rash    Patient Active Problem List   Diagnosis Date Noted  . Cardiac murmur 01/24/2015  . 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 Avitaminosis D 11/09/2009  . Adiposity 02/17/2008  . Tobacco use 02/17/2008  . Hypercholesterolemia without hypertriglyceridemia 05/28/2002  . 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 PONV (postoperative nausea and vomiting)   . Arthritis     "think I have it in both knees" (05/06/2012)  . Allergy   . Hypertension    Current Outpatient Prescriptions on File Prior to Visit  Medication Sig  . aspirin EC 325 MG EC tablet Take 1 tablet (325 mg total) by mouth 2 (two) times  daily. (Patient taking differently: Take 325 mg by mouth daily. )  . hydrochlorothiazide (MICROZIDE) 12.5 MG capsule Take 12.5 mg by mouth daily. For fluid retention  . ibuprofen (ADVIL,MOTRIN) 800 MG tablet TAKE 1 TABLET BY MOUTH EVERY EVENING  . loratadine (CLARITIN) 10 MG tablet Take 10 mg by mouth daily.  . montelukast (SINGULAIR) 10 MG tablet Take 10 mg by mouth daily.   No current facility-administered medications on file prior to visit.    Past Surgical History  Procedure Laterality Date  . Joint replacement      right knee  . Total thyroidectomy  1990's  . Total knee revision  05/05/2012    Procedure: TOTAL KNEE REVISION;  Surgeon: Kerin Salen, MD;  Location: [place];  Service: Orthopedics;  Laterality: Right;  . Replacement total knee  ~ 2008    right  . Dilation and curettage of uterus    . Tubal ligation     Social History   Social History  . Marital Status: Married    Spouse Name: N/A  . Number of Children: N/A  . Years of Education: N/A   Occupational History  . Not on file.   Social History Main Topics  . Smoking status: Current Every Day Smoker -- 0.50 packs/day for 40 years    Types: Cigarettes  . Smokeless tobacco: Never Used  . Alcohol Use: 7.2 oz/week    12 Cans of beer per week     Comment: 05/06/2012 "  3 cans of beer @ a time; more than that sometimes on weekends when I'm not working"  . Drug Use: No  . Sexual Activity: No   Other Topics Concern  . Not on file   Social History Narrative   Family History  Problem Relation Age of Onset  . CAD Mother   . Heart attack Brother      Review of Systems  Constitutional: Negative.   HENT: Negative.   Eyes: Negative.   Respiratory: Positive for cough.   Cardiovascular: Negative.   Gastrointestinal: Negative.   Endocrine: Negative.   Genitourinary: Negative.   Musculoskeletal: Negative.   Skin: Negative.   Allergic/Immunologic: Negative.   Neurological: Negative.   Hematological: Negative.     Psychiatric/Behavioral: Negative.     Social History  Substance Use Topics  . Smoking status: Current Every Day Smoker -- 0.50 packs/day for 40 years    Types: Cigarettes  . Smokeless tobacco: Never Used  . Alcohol Use: 7.2 oz/week    12 Cans of beer per week     Comment: 05/06/2012 "3 cans of beer @ a time; more than that sometimes on weekends when I'm not working"   Objective:   BP 118/66 mmHg  Pulse 70  Temp(Src) 98.3 F (36.8 C) (Oral)  Resp 16  Ht 5\' 2"  (1.575 m)  Wt 221 lb (100.245 kg)  BMI 40.41 kg/m2  SpO2 98%  Physical Exam  Constitutional: She is oriented to person, place, and time. She appears well-developed and well-nourished. No distress.  HENT:  Head: Normocephalic and atraumatic.  Right Ear: External ear normal.  Left Ear: External ear normal.  Nose: Nose normal.  Mouth/Throat: Oropharynx is clear and moist. No oropharyngeal exudate.  Eyes: Conjunctivae and EOM are normal. Pupils are equal, round, and reactive to light. Right eye exhibits no discharge. Left eye exhibits no discharge. No scleral icterus.  Neck: Normal range of motion. Neck supple. No JVD present. No tracheal deviation present. No thyromegaly present.  Cardiovascular: Normal rate, regular rhythm and intact distal pulses.  Exam reveals no gallop and no friction rub.   Murmur heard.  Systolic murmur is present with a grade of 2/6  Pulmonary/Chest: Effort normal and breath sounds normal. No respiratory distress. She has no wheezes. She has no rales. She exhibits no tenderness.  Abdominal: Soft. Bowel sounds are normal. She exhibits no distension and no mass. There is no tenderness. There is no rebound and no guarding.  Musculoskeletal: Normal range of motion. She exhibits no edema or tenderness.  Lymphadenopathy:    She has no cervical adenopathy.  Neurological: She is alert and oriented to person, place, and time.  Skin: Skin is warm and dry. No rash noted. She is not diaphoretic.  Psychiatric:  She has a normal mood and affect. Her behavior is normal. Judgment and thought content normal.  Vitals reviewed.       Assessment & Plan:     1. Preoperative evaluation to rule out surgical contraindication EKG from 01/27/15 reviewed.  She has no complaints of chest pain or SOB.  She has no history of CAD or COPD.  She does complain of a cough today in the office so I will treat with a z-pak preemptively so that she does not get worse prior to her scheduled L TKR on 02/10/15.  She is to call the office if the cough worsens prior to surgery.  I feel she carries the standard intermediate risk for surgery.  2. Cough Will treat  as below preemptively to prevent worsening as she has upcoming surgery scheduled for 02/10/15.  She is to call the office if symptoms worsen or persist. - HYDROcodone-homatropine (HYCODAN) 5-1.5 MG/5ML syrup; Take 5 mLs by mouth every 8 (eight) hours as needed for cough.  Dispense: 120 mL; Refill: 0 - azithromycin (ZITHROMAX) 250 MG tablet; Take 2 tablets PO on day one, and one tablet PO daily thereafter until completed.  Dispense: 6 tablet; Refill: 0  3. Bronchitis See above. - azithromycin (ZITHROMAX) 250 MG tablet; Take 2 tablets PO on day one, and one tablet PO daily thereafter until completed.  Dispense: 6 tablet; Refill: 0       Mar Daring, PA-C  [facility]

## 2015-01-31 NOTE — Patient Instructions (Signed)
Cough, Adult  A cough is a reflex that helps clear your throat and airways. It can help heal the body or may be a reaction to an irritated airway. A cough may only last 2 or 3 weeks (acute) or may last more than 8 weeks (chronic).  CAUSES Acute cough:  Viral or bacterial infections. Chronic cough:  Infections.  Allergies.  Asthma.  Post-nasal drip.  Smoking.  Heartburn or acid reflux.  Some medicines.  Chronic lung problems (COPD).  Cancer. SYMPTOMS   Cough.  Fever.  Chest pain.  Increased breathing rate.  High-pitched whistling sound when breathing (wheezing).  Colored mucus that you cough up (sputum). TREATMENT   A bacterial cough may be treated with antibiotic medicine.  A viral cough must run its course and will not respond to antibiotics.  Your caregiver may recommend other treatments if you have a chronic cough. HOME CARE INSTRUCTIONS   Only take over-the-counter or prescription medicines for pain, discomfort, or fever as directed by your caregiver. Use cough suppressants only as directed by your caregiver.  Use a cold steam vaporizer or humidifier in your bedroom or home to help loosen secretions.  Sleep in a semi-upright position if your cough is worse at night.  Rest as needed.  Stop smoking if you smoke. SEEK IMMEDIATE MEDICAL CARE IF:   You have pus in your sputum.  Your cough starts to worsen.  You cannot control your cough with suppressants and are losing sleep.  You begin coughing up blood.  You have difficulty breathing.  You develop pain which is getting worse or is uncontrolled with medicine.  You have a fever. MAKE SURE YOU:   Understand these instructions.  Will watch your condition.  Will get help right away if you are not doing well or get worse. Document Released: 12/01/2010 Document Revised: 08/27/2011 Document Reviewed: 12/01/2010 ExitCare Patient Information 2015 [employer]. This information is not intended  to replace advice given to you by your health care provider. Make sure you discuss any questions you have with your health care provider.  

## 2015-02-07 ENCOUNTER — Other Ambulatory Visit: Payer: Self-pay | Admitting: Family Medicine

## 2015-02-08 ENCOUNTER — Other Ambulatory Visit: Payer: Self-pay

## 2015-02-08 DIAGNOSIS — J309 Allergic rhinitis, unspecified: Secondary | ICD-10-CM | POA: Insufficient documentation

## 2015-02-08 DIAGNOSIS — R609 Edema, unspecified: Secondary | ICD-10-CM | POA: Insufficient documentation

## 2015-02-08 DIAGNOSIS — I10 Essential (primary) hypertension: Secondary | ICD-10-CM

## 2015-02-08 MED ORDER — MONTELUKAST SODIUM 10 MG PO TABS: 10.0000 mg | ORAL_TABLET | Freq: Every day | ORAL | Status: DC

## 2015-02-08 MED ORDER — HYDROCHLOROTHIAZIDE 12.5 MG PO CAPS: ORAL_CAPSULE | ORAL | Status: DC

## 2015-02-10 ENCOUNTER — Inpatient Hospital Stay: Payer: BLUE CROSS/BLUE SHIELD | Admitting: Anesthesiology

## 2015-02-10 ENCOUNTER — Inpatient Hospital Stay: Payer: BLUE CROSS/BLUE SHIELD

## 2015-02-10 ENCOUNTER — Inpatient Hospital Stay
Admission: RE | Admit: 2015-02-10 | Discharge: 2015-02-13 | DRG: 470 | Disposition: A | Discharge status: Home Health | Payer: BLUE CROSS/BLUE SHIELD | Source: Ambulatory Visit | Attending: Orthopedic Surgery | Admitting: Orthopedic Surgery

## 2015-02-10 ENCOUNTER — Encounter
Admission: RE | Disposition: A | Discharge status: Home Health | Payer: Self-pay | Source: Ambulatory Visit | Attending: Orthopedic Surgery

## 2015-02-10 ENCOUNTER — Encounter: Payer: Self-pay | Admitting: Anesthesiology

## 2015-02-10 DIAGNOSIS — N898 Other specified noninflammatory disorders of vagina: Secondary | ICD-10-CM | POA: Diagnosis present

## 2015-02-10 DIAGNOSIS — I252 Old myocardial infarction: Secondary | ICD-10-CM | POA: Diagnosis not present

## 2015-02-10 DIAGNOSIS — Z96651 Presence of right artificial knee joint: Secondary | ICD-10-CM | POA: Diagnosis present

## 2015-02-10 DIAGNOSIS — Z6841 Body Mass Index (BMI) 40.0 and over, adult: Secondary | ICD-10-CM

## 2015-02-10 DIAGNOSIS — Z7982 Long term (current) use of aspirin: Secondary | ICD-10-CM | POA: Diagnosis not present

## 2015-02-10 DIAGNOSIS — Z951 Presence of aortocoronary bypass graft: Secondary | ICD-10-CM

## 2015-02-10 DIAGNOSIS — F17210 Nicotine dependence, cigarettes, uncomplicated: Secondary | ICD-10-CM | POA: Diagnosis present

## 2015-02-10 DIAGNOSIS — M1710 Unilateral primary osteoarthritis, unspecified knee: Secondary | ICD-10-CM | POA: Diagnosis present

## 2015-02-10 DIAGNOSIS — G8918 Other acute postprocedural pain: Secondary | ICD-10-CM

## 2015-02-10 DIAGNOSIS — I10 Essential (primary) hypertension: Secondary | ICD-10-CM | POA: Diagnosis present

## 2015-02-10 DIAGNOSIS — Z79899 Other long term (current) drug therapy: Secondary | ICD-10-CM | POA: Diagnosis not present

## 2015-02-10 DIAGNOSIS — M1712 Unilateral primary osteoarthritis, left knee: Secondary | ICD-10-CM | POA: Diagnosis present

## 2015-02-10 DIAGNOSIS — Z9861 Coronary angioplasty status: Secondary | ICD-10-CM

## 2015-02-10 HISTORY — PX: TOTAL KNEE ARTHROPLASTY: SHX125

## 2015-02-10 LAB — CREATININE, SERUM
CREATININE: 0.57 mg/dL (ref 0.44–1.00)
GFR calc Af Amer: >60 mL/min (ref >60)
GFR calc non Af Amer: >60 mL/min (ref >60)

## 2015-02-10 LAB — CBC
HCT: 36 % (ref 35.0–47.0)
Hemoglobin: 12.1 g/dL (ref 12.0–16.0)
MCH: 31.6 pg (ref 26.0–34.0)
MCHC: 33.6 g/dL (ref 32.0–36.0)
MCV: 94 fL (ref 80.0–100.0)
PLATELETS: 260 10*3/uL (ref 150–440)
RBC: 3.83 MIL/uL (ref 3.80–5.20)
RDW: 13.3 % (ref 11.5–14.5)
WBC: 13 10*3/uL — ABNORMAL HIGH (ref 3.6–11.0)

## 2015-02-10 LAB — I-STAT 4 (NA, K, GLUC, HGB, HCT): Potassium: 3.9 mmol/L

## 2015-02-10 LAB — POTASSIUM: Potassium: 5 mmol/L (ref 3.5–5.1)

## 2015-02-10 SURGERY — ARTHROPLASTY, KNEE, TOTAL
Anesthesia: General | Laterality: Left

## 2015-02-10 MED ORDER — TAPENTADOL HCL 50 MG PO TABS
100.0000 mg | ORAL_TABLET | ORAL | Status: DC | PRN
  Administered 2015-02-10 – 2015-02-13 (×9): 100 mg via ORAL
  Filled 2015-02-10 (×9): qty 2

## 2015-02-10 MED ORDER — BISACODYL 10 MG RE SUPP: 10.0000 mg | Freq: Every day | RECTAL | Status: DC | PRN

## 2015-02-10 MED ORDER — MORPHINE SULFATE (PF) 4 MG/ML IV SOLN
INTRAVENOUS | Status: DC | PRN
  Administered 2015-02-10: 10 mg via SUBCUTANEOUS

## 2015-02-10 MED ORDER — BUPIVACAINE HCL (PF) 0.5 % IJ SOLN
INTRAMUSCULAR | Status: DC | PRN
  Administered 2015-02-10: 3 mL

## 2015-02-10 MED ORDER — ACETAMINOPHEN 325 MG PO TABS: 650.0000 mg | ORAL_TABLET | Freq: Four times a day (QID) | ORAL | Status: DC | PRN

## 2015-02-10 MED ORDER — ONDANSETRON HCL 4 MG PO TABS: 4.0000 mg | ORAL_TABLET | Freq: Four times a day (QID) | ORAL | Status: DC | PRN

## 2015-02-10 MED ORDER — ACETAMINOPHEN 650 MG RE SUPP: 650.0000 mg | Freq: Four times a day (QID) | RECTAL | Status: DC | PRN

## 2015-02-10 MED ORDER — KETAMINE HCL 50 MG/ML IJ SOLN
INTRAMUSCULAR | Status: DC | PRN
Start: 2015-02-10 — End: 2015-02-10
  Administered 2015-02-10: 50 mg via INTRAMUSCULAR
  Administered 2015-02-10: 25 mg via INTRAMUSCULAR

## 2015-02-10 MED ORDER — ONDANSETRON HCL 4 MG/2ML IJ SOLN: 4.0000 mg | Freq: Four times a day (QID) | INTRAMUSCULAR | Status: DC | PRN

## 2015-02-10 MED ORDER — EPHEDRINE SULFATE 50 MG/ML IJ SOLN
INTRAMUSCULAR | Status: DC | PRN
Start: 2015-02-10 — End: 2015-02-10
  Administered 2015-02-10: 5 mg via INTRAVENOUS

## 2015-02-10 MED ORDER — CEFAZOLIN SODIUM-DEXTROSE 2-3 GM-% IV SOLR
2.0000 g | Freq: Four times a day (QID) | INTRAVENOUS | Status: AC
  Administered 2015-02-10 (×2): 2 g via INTRAVENOUS
  Filled 2015-02-10 (×2): qty 50

## 2015-02-10 MED ORDER — HYDROCHLOROTHIAZIDE 12.5 MG PO CAPS
12.5000 mg | ORAL_CAPSULE | Freq: Every day | ORAL | Status: DC
  Administered 2015-02-11 – 2015-02-13 (×3): 12.5 mg via ORAL
  Filled 2015-02-10 (×3): qty 1

## 2015-02-10 MED ORDER — LACTATED RINGERS IV SOLN
INTRAVENOUS | Status: DC
  Administered 2015-02-10 (×3): via INTRAVENOUS

## 2015-02-10 MED ORDER — MIDAZOLAM HCL 5 MG/5ML IJ SOLN
INTRAMUSCULAR | Status: DC | PRN
  Administered 2015-02-10: 2 mg via INTRAVENOUS
  Administered 2015-02-10: 1 mg via INTRAVENOUS

## 2015-02-10 MED ORDER — PHENYLEPHRINE HCL 10 MG/ML IJ SOLN
INTRAMUSCULAR | Status: DC | PRN
  Administered 2015-02-10 (×3): 100 ug via INTRAVENOUS

## 2015-02-10 MED ORDER — ENOXAPARIN SODIUM 30 MG/0.3ML ~~LOC~~ SOLN: 30.0000 mg | Freq: Two times a day (BID) | SUBCUTANEOUS | Status: DC

## 2015-02-10 MED ORDER — MONTELUKAST SODIUM 10 MG PO TABS
10.0000 mg | ORAL_TABLET | Freq: Every day | ORAL | Status: DC
Start: 2015-02-10 — End: 2015-02-13
  Administered 2015-02-10 – 2015-02-13 (×4): 10 mg via ORAL
  Filled 2015-02-10 (×4): qty 1

## 2015-02-10 MED ORDER — PHENOL 1.4 % MT LIQD: 1.0000 | OROMUCOSAL | Status: DC | PRN

## 2015-02-10 MED ORDER — TRANEXAMIC ACID 1000 MG/10ML IV SOLN
1000.0000 mg | INTRAVENOUS | Status: AC
  Administered 2015-02-10: 1000 mg via INTRAVENOUS
  Filled 2015-02-10: qty 10

## 2015-02-10 MED ORDER — ONDANSETRON HCL 4 MG/2ML IJ SOLN
INTRAMUSCULAR | Status: DC | PRN
  Administered 2015-02-10: 4 mg via INTRAVENOUS

## 2015-02-10 MED ORDER — METOCLOPRAMIDE HCL 5 MG PO TABS: 5.0000 mg | ORAL_TABLET | Freq: Three times a day (TID) | ORAL | Status: DC | PRN

## 2015-02-10 MED ORDER — MAGNESIUM HYDROXIDE 400 MG/5ML PO SUSP
30.0000 mL | Freq: Every day | ORAL | Status: DC | PRN
  Administered 2015-02-11: 30 mL via ORAL
  Filled 2015-02-10: qty 30

## 2015-02-10 MED ORDER — MORPHINE SULFATE (PF) 10 MG/ML IV SOLN
INTRAVENOUS | Status: AC
  Filled 2015-02-10: qty 1

## 2015-02-10 MED ORDER — BUPIVACAINE-EPINEPHRINE 0.25% -1:200000 IJ SOLN
INTRAMUSCULAR | Status: DC | PRN
  Administered 2015-02-10: 30 mL

## 2015-02-10 MED ORDER — MORPHINE SULFATE (PF) 2 MG/ML IV SOLN
2.0000 mg | INTRAVENOUS | Status: DC | PRN
  Administered 2015-02-10 – 2015-02-11 (×6): 2 mg via INTRAVENOUS
  Filled 2015-02-10 (×6): qty 1

## 2015-02-10 MED ORDER — MAGNESIUM CITRATE PO SOLN: 1.0000 | Freq: Once | ORAL | Status: DC | PRN

## 2015-02-10 MED ORDER — PROPOFOL INFUSION 10 MG/ML OPTIME
INTRAVENOUS | Status: DC | PRN
  Administered 2015-02-10: 50 ug/kg/min via INTRAVENOUS

## 2015-02-10 MED ORDER — ASPIRIN EC 325 MG PO TBEC
325.0000 mg | DELAYED_RELEASE_TABLET | Freq: Every day | ORAL | Status: DC
  Administered 2015-02-10 – 2015-02-13 (×4): 325 mg via ORAL
  Filled 2015-02-10 (×4): qty 1

## 2015-02-10 MED ORDER — SODIUM CHLORIDE 0.9 % IV SOLN
INTRAVENOUS | Status: DC | PRN
  Administered 2015-02-10: 60 mL

## 2015-02-10 MED ORDER — CEFAZOLIN SODIUM-DEXTROSE 2-3 GM-% IV SOLR
2.0000 g | Freq: Once | INTRAVENOUS | Status: AC
  Administered 2015-02-10: 2 g via INTRAVENOUS

## 2015-02-10 MED ORDER — METOCLOPRAMIDE HCL 5 MG/ML IJ SOLN: 5.0000 mg | Freq: Three times a day (TID) | INTRAMUSCULAR | Status: DC | PRN

## 2015-02-10 MED ORDER — FAMOTIDINE 20 MG PO TABS
20.0000 mg | ORAL_TABLET | Freq: Once | ORAL | Status: AC
  Administered 2015-02-10: 20 mg via ORAL

## 2015-02-10 MED ORDER — SODIUM CHLORIDE 0.9 % IV SOLN
INTRAVENOUS | Status: DC
  Administered 2015-02-10 – 2015-02-11 (×3): via INTRAVENOUS

## 2015-02-10 MED ORDER — LORATADINE 10 MG PO TABS
10.0000 mg | ORAL_TABLET | Freq: Every day | ORAL | Status: DC
  Administered 2015-02-10 – 2015-02-13 (×4): 10 mg via ORAL
  Filled 2015-02-10 (×4): qty 1

## 2015-02-10 MED ORDER — ENOXAPARIN SODIUM 40 MG/0.4ML ~~LOC~~ SOLN
40.0000 mg | Freq: Two times a day (BID) | SUBCUTANEOUS | Status: DC
  Administered 2015-02-11 – 2015-02-13 (×5): 40 mg via SUBCUTANEOUS
  Filled 2015-02-10 (×5): qty 0.4

## 2015-02-10 MED ORDER — MENTHOL 3 MG MT LOZG
1.0000 | LOZENGE | OROMUCOSAL | Status: DC | PRN
  Filled 2015-02-10: qty 9

## 2015-02-10 MED ORDER — DOCUSATE SODIUM 100 MG PO CAPS
100.0000 mg | ORAL_CAPSULE | Freq: Two times a day (BID) | ORAL | Status: DC
Start: 2015-02-10 — End: 2015-02-13
  Administered 2015-02-10 – 2015-02-13 (×7): 100 mg via ORAL
  Filled 2015-02-10 (×7): qty 1

## 2015-02-10 MED ORDER — PROMETHAZINE HCL 25 MG/ML IJ SOLN: 6.2500 mg | INTRAMUSCULAR | Status: DC | PRN

## 2015-02-10 MED ORDER — FENTANYL CITRATE (PF) 100 MCG/2ML IJ SOLN: 25.0000 ug | INTRAMUSCULAR | Status: DC | PRN

## 2015-02-10 MED ORDER — DIPHENHYDRAMINE HCL 25 MG PO CAPS
50.0000 mg | ORAL_CAPSULE | Freq: Four times a day (QID) | ORAL | Status: DC | PRN
  Administered 2015-02-10 – 2015-02-13 (×4): 50 mg via ORAL
  Filled 2015-02-10 (×4): qty 2

## 2015-02-10 MED ORDER — GLYCOPYRROLATE 0.2 MG/ML IJ SOLN
INTRAMUSCULAR | Status: DC | PRN
  Administered 2015-02-10: 0.2 mg via INTRAVENOUS

## 2015-02-10 SURGICAL SUPPLY — 51 items
BANDAGE ELASTIC 6 CLIP ST LF (GAUZE/BANDAGES/DRESSINGS) ×3 IMPLANT
BLADE SAW 1 (BLADE) ×3 IMPLANT
BLOCK CUTTING FEMUR 2 LT MED (MISCELLANEOUS) ×3 IMPLANT
BLOCK CUTTING TIBIAL 3 LT (MISCELLANEOUS) ×3 IMPLANT
CANISTER SUCT 1200ML W/VALVE (MISCELLANEOUS) ×3 IMPLANT
CANISTER SUCT 3000ML (MISCELLANEOUS) ×6 IMPLANT
CAPT KNEE TOTAL 3 ×3 IMPLANT
CATH FOL LEG HOLDER (MISCELLANEOUS) ×3 IMPLANT
CATH TRAY METER 16FR LF (MISCELLANEOUS) ×3 IMPLANT
CEMENT HV SMART SET (Cement) ×6 IMPLANT
CHLORAPREP W/TINT 26ML (MISCELLANEOUS) ×6 IMPLANT
COOLER POLAR GLACIER W/PUMP (MISCELLANEOUS) ×3 IMPLANT
DRAPE INCISE IOBAN 66X45 STRL (DRAPES) ×6 IMPLANT
DRAPE SHEET LG 3/4 BI-LAMINATE (DRAPES) ×6 IMPLANT
ELECT CAUTERY BLADE 6.4 (BLADE) ×3 IMPLANT
GAUZE PETRO XEROFOAM 1X8 (MISCELLANEOUS) ×3 IMPLANT
GAUZE SPONGE 4X4 12PLY STRL (GAUZE/BANDAGES/DRESSINGS) ×3 IMPLANT
GLOVE BIOGEL PI IND STRL 9 (GLOVE) ×3 IMPLANT
GLOVE BIOGEL PI INDICATOR 9 (GLOVE) ×6
GLOVE SURG ORTHO 9.0 STRL STRW (GLOVE) ×9 IMPLANT
GOWN SPECIALTY ULTRA XL (MISCELLANEOUS) ×3 IMPLANT
GOWN STRL REUS W/ TWL LRG LVL3 (GOWN DISPOSABLE) ×2 IMPLANT
GOWN STRL REUS W/TWL LRG LVL3 (GOWN DISPOSABLE) ×4
HANDPIECE SUCTION TUBG SURGILV (MISCELLANEOUS) ×3 IMPLANT
HOOD PEEL AWAY FACE SHEILD DIS (HOOD) ×6 IMPLANT
IMMBOLIZER KNEE 19 BLUE UNIV (SOFTGOODS) ×3 IMPLANT
IV SET EXTENSION 6 LL TADAPT (SET/KITS/TRAYS/PACK) ×3 IMPLANT
KNEE MEDACTA TIBIAL/FEMORAL BL (Knees) ×3 IMPLANT
KNIFE SCULPS 14X20 (INSTRUMENTS) ×3 IMPLANT
NDL SAFETY 18GX1.5 (NEEDLE) ×3 IMPLANT
NEEDLE SPNL 18GX3.5 QUINCKE PK (NEEDLE) ×3 IMPLANT
NEEDLE SPNL 20GX3.5 QUINCKE YW (NEEDLE) ×3 IMPLANT
NS IRRIG 1000ML POUR BTL (IV SOLUTION) ×3 IMPLANT
PACK TOTAL KNEE (MISCELLANEOUS) ×3 IMPLANT
PAD GROUND ADULT SPLIT (MISCELLANEOUS) ×3 IMPLANT
PAD WRAPON POLAR KNEE (MISCELLANEOUS) ×1 IMPLANT
SOL .9 NS 3000ML IRR  AL (IV SOLUTION) ×2
SOL .9 NS 3000ML IRR UROMATIC (IV SOLUTION) ×1 IMPLANT
STAPLER SKIN PROX 35W (STAPLE) ×3 IMPLANT
STEM EXTENSION 11MMX30MM (Stem) ×3 IMPLANT
STRAP SAFETY BODY (MISCELLANEOUS) ×3 IMPLANT
SUCTION FRAZIER TIP 10 FR DISP (SUCTIONS) ×3 IMPLANT
SUT DVC 2 QUILL PDO  T11 36X36 (SUTURE) ×2
SUT DVC 2 QUILL PDO T11 36X36 (SUTURE) ×1 IMPLANT
SUT DVC QUILL MONODERM 30X30 (SUTURE) ×3 IMPLANT
SUT ETHIBOND NAB CT1 #1 30IN (SUTURE) ×3 IMPLANT
SYR 20CC LL (SYRINGE) ×3 IMPLANT
SYR 50ML LL SCALE MARK (SYRINGE) ×3 IMPLANT
TOWER CARTRIDGE SMART MIX (DISPOSABLE) ×3 IMPLANT
WATER STERILE IRR 1000ML POUR (IV SOLUTION) IMPLANT
WRAPON POLAR PAD KNEE (MISCELLANEOUS) ×3

## 2015-02-10 NOTE — H&P (Signed)
Reviewed paper H+P, will be scanned into chart. No changes noted.  

## 2015-02-10 NOTE — Anesthesia Procedure Notes (Signed)
Spinal Patient location during procedure: OR Start time: 02/10/2015 10:05 AM End time: 02/10/2015 10:25 AM Staffing Anesthesiologist: Martha Clan Resident/CRNA: Nelda Marseille Performed by: anesthesiologist and resident/CRNA  Preanesthetic Checklist Completed: patient identified, site marked, surgical consent, pre-op evaluation, timeout performed, IV checked, risks and benefits discussed and monitors and equipment checked Spinal Block Patient position: sitting Prep: Betadine Patient monitoring: heart rate, continuous pulse ox, blood pressure and cardiac monitor Approach: midline Location: L4-5 Injection technique: single-shot Needle Needle type: Whitacre and Introducer  Needle gauge: 25 G Needle length: 15 cm Additional Notes Negative paresthesia. Negative blood return. Positive free-flowing CSF. Expiration date of kit checked and confirmed. Patient tolerated procedure well, without complications.

## 2015-02-10 NOTE — Progress Notes (Signed)
Enoxaparin Dosing for patient with BMI >32  Per policy, will change Enoxaparin from 30 mg SQ BID to 40 mg SQ BID per policy in this patient with a BMI of 40 or greater.

## 2015-02-10 NOTE — Progress Notes (Signed)
oob to chair  At the change  Of shift tolerated well neuro checks wnl ./ pain relief with present regime

## 2015-02-10 NOTE — Progress Notes (Signed)
Pt having some itching, spoke with Dr. Rudene Christians. Benadryl 50mg  po q6h prn for itching.

## 2015-02-10 NOTE — Anesthesia Postprocedure Evaluation (Signed)
  Anesthesia Post-op Note  [name]  Procedure(s) Performed: Procedure(s): TOTAL KNEE ARTHROPLASTY (Left)  Anesthesia type:General  Patient location: PACU  Post pain: Pain level controlled  Post assessment: Post-op Vital signs reviewed, Patient's Cardiovascular Status Stable, Respiratory Function Stable, Patent Airway and No signs of Nausea or vomiting  Post vital signs: Reviewed and stable  Last Vitals:  Filed Vitals:   02/10/15 1536  BP: 117/64  Pulse: 67  Temp: 35.8 C  Resp: 17    Level of consciousness: awake, alert  and patient cooperative  Complications: No apparent anesthesia complications

## 2015-02-10 NOTE — Anesthesia Preprocedure Evaluation (Addendum)
Anesthesia Evaluation  Patient identified by MRN, date of birth, ID band Patient awake    Reviewed: Allergy & Precautions, H&P , NPO status , Patient's Chart, lab work & pertinent test results, reviewed documented beta blocker date and time   History of Anesthesia Complications (+) PONV and history of anesthetic complications  Airway Mallampati: III  TM Distance: >3 FB Neck ROM: full    Dental no notable dental hx. (+) Poor Dentition, Missing   Pulmonary neg shortness of breath, neg sleep apnea, neg COPDneg recent URI, Current Smoker,  breath sounds clear to auscultation  Pulmonary exam normal       Cardiovascular Exercise Tolerance: Good hypertension, - angina- CAD, - Past MI, - Cardiac Stents and - CABG Normal cardiovascular exam- dysrhythmias - Valvular Problems/MurmursRhythm:regular Rate:Normal     Neuro/Psych negative neurological ROS  negative psych ROS   GI/Hepatic negative GI ROS, Neg liver ROS,   Endo/Other  neg diabetesMorbid obesity  Renal/GU negative Renal ROS  negative genitourinary   Musculoskeletal   Abdominal   Peds  Hematology negative hematology ROS (+)   Anesthesia Other Findings Past Medical History:   PONV (postoperative nausea and vomiting)                     Arthritis                                                      Comment:"think I have it in both knees" (05/06/2012)   Allergy                                                      Hypertension                                                 Reproductive/Obstetrics negative OB ROS                            Anesthesia Physical Anesthesia Plan  ASA: III  Anesthesia Plan: Spinal and MAC   Post-op Pain Management:    Induction:   Airway Management Planned:   Additional Equipment:   Intra-op Plan:   Post-operative Plan:   Informed Consent: I have reviewed the patients History and Physical, chart, labs  and discussed the procedure including the risks, benefits and alternatives for the proposed anesthesia with the patient or authorized representative who has indicated his/her understanding and acceptance.   Dental Advisory Given  Plan Discussed with: Anesthesiologist, CRNA and Surgeon  Anesthesia Plan Comments:        Anesthesia Quick Evaluation

## 2015-02-10 NOTE — Transfer of Care (Signed)
Immediate Anesthesia Transfer of Care Note  [name]  Procedure(s) Performed: Procedure(s): TOTAL KNEE ARTHROPLASTY (Left)  Patient Location: PACU  Anesthesia Type:Spinal  Level of Consciousness: awake, oriented and sedated  Airway & Oxygen Therapy: Patient Spontanous Breathing and Patient connected to face mask oxygen  Post-op Assessment: Report given to RN and Post -op Vital signs reviewed and stable  Post vital signs: Reviewed and stable  Last Vitals:  Filed Vitals:   02/10/15 0809  BP: 112/98  Pulse: 67  Temp: 36.4 C  Resp: 16    Complications: No apparent anesthesia complications

## 2015-02-10 NOTE — Progress Notes (Signed)
PT Hold Note  Patient Details Name: Cathy Townsend MRN: [id number] DOB: June 12, 1956   Cancelled Treatment:    Reason Eval/Treat Not Completed: Medical issues which prohibited therapy. Order received. RN consulted who reports that pt arrived to the floor late and has not regained full sensation/motor function from spinal. Per RN not currently appropriate for POD#0 evaluation. Will perform evaluation on POD#1.  Lyndel Safe Lakresha Stifter PT, DPT   Tarra Pence 02/10/2015, 3:20 PM

## 2015-02-10 NOTE — Op Note (Signed)
Clinton Quant  PRE-OPERATIVE DIAGNOSIS: OA  POST-OPERATIVE DIAGNOSIS: osteoarthritis  PROCEDURE: Procedure(s): TOTAL KNEE ARTHROPLASTY (Left)  SURGEON: Laurene Footman, MD  ASSISTANTS: Rachelle Hora [facility]  ANESTHESIA: spinal  EBL: Total I/O In: 1100 [I.V.:1100] Out: 550 [Urine:500; Blood:50]  BLOOD ADMINISTERED:none  DRAINS: none   LOCAL MEDICATIONS USED: MARCAINE and OTHER Toradol morphine and Exparel  SPECIMEN: Source of Specimen: Cut ends of bone left knee  DISPOSITION OF SPECIMEN: PATHOLOGY  COUNTS: YES  TOURNIQUET:  93 minutes at 300 mmHg  IMPLANTS: Medacta GMK sphere left 2 femur, 3 left tibia with 02 mm insert and 2 patella all components cemented  DICTATION: .Dragon Dictation patient was brought to the operating room and after adequate anesthesia was obtained left leg was prepped draped in sterile fashion with a tourniquet by the upper thigh. After patient identification and timeout procedures were completed, tourniquet was raised to 300 mmHg. Midline skin incision was made followed by medial parapatellar arthrotomy. There is extensive wear of the tibial condyles bilaterally but extremely eburnated bone in the medial compartment extensive wear of the medial femoral condyle as well and patellofemoral joint. The anterior cruciate ligament and fat pad were excised and the Medacta cutting blocks were applied after removing some residual cartilage. Proximal tibia and distal femur cuts were made and adequate extension gap was present. The 4-in-1 cutting guide was applied the distal femur anterior posterior chamfer cuts made followed by the trochlear groove cut of the proximal tibia cut was completed and the residual menisci excised at this point. The tibia template was applied and proximal drill hole made and trials placed 12 mm insert gave good stability. Next the trials were removed and and the patella was cut using the patellar cutting guide drill holes made and it  sized to a size 2. This point local asthenic with half percent Sensorcaine and morphine were infiltrated around the joint along with a dilute Exparel. The third bony surfaces were thoroughly irrigated and dried. The components were then cemented into place to the tibia first after all implants were placed and excess cement and removed. Further cement set. Next the knee was thoroughly irrigated joint was checked for loose cement and after thorough irrigation the patella was noted to track very well with no touch technique and lateral release not required. The medial arthrotomy was then repaired using a heavy Quill, 2-0Quill substantially and skin staples patient center comes stable condition after application of sterile dressings of Xeroform 4 x 4 ABDs and web roll Polar Care and Ace wrap  PLAN OF CARE: Admit to inpatient   PATIENT DISPOSITION: PACU - hemodynamically stable.

## 2015-02-11 LAB — CBC
HEMATOCRIT: 35.9 % (ref 35.0–47.0)
Hemoglobin: 12.1 g/dL (ref 12.0–16.0)
MCH: 31.9 pg (ref 26.0–34.0)
MCHC: 33.5 g/dL (ref 32.0–36.0)
MCV: 95.2 fL (ref 80.0–100.0)
PLATELETS: 261 10*3/uL (ref 150–440)
RBC: 3.77 MIL/uL — AB (ref 3.80–5.20)
RDW: 13.2 % (ref 11.5–14.5)
WBC: 6 10*3/uL (ref 3.6–11.0)

## 2015-02-11 LAB — BASIC METABOLIC PANEL
ANION GAP: 8 (ref 5–15)
BUN: 9 mg/dL (ref 6–20)
CALCIUM: 9 mg/dL (ref 8.9–10.3)
CO2: 26 mmol/L (ref 22–32)
Chloride: 105 mmol/L (ref 101–111)
Creatinine, Ser: 0.51 mg/dL (ref 0.44–1.00)
Glucose, Bld: 113 mg/dL — ABNORMAL HIGH (ref 65–99)
POTASSIUM: 3.7 mmol/L (ref 3.5–5.1)
Sodium: 139 mmol/L (ref 135–145)

## 2015-02-11 NOTE — Care Management Note (Addendum)
Case Management Note  Patient Details  Name: Cathy Townsend MRN: [id number] Date of Birth: 12-26-1955  Subjective/Objective:                  Met with patient's husband. Patient was receiving patient care during my rounds. I met patient yesterday when she was in room 133 and she stated she wants to go home at discharge. Her husband said she uses [facility] [phone] for Rx. She has a bedside commode and front-wheeled walker per her husband.   Action/Plan: List of home health agencies left with her husband. Lovenox 30m #14 called in to [facility]. RNCM to continue to follow.   Expected Discharge Date:                  Expected Discharge Plan:     In-House Referral:     Discharge planning Services  CM Consult  Post Acute Care Choice:  Home Health Choice offered to:  Patient, Spouse  DME Arranged:    DME Agency:     HH Arranged:    HH Agency:     Status of Service:  In process, will continue to follow  Medicare Important Message Given:    Date Medicare IM Given:    Medicare IM give by:    Date Additional Medicare IM Given:    Additional Medicare Important Message give by:     If discussed at [place] Meetings, dates discussed:    Additional Comments: Followed up with patient and her husband- they would like to use [facility] I have notified Barb RN with [facility] referral.  Lovenox $15.  02/11/2015, 11:02 AM

## 2015-02-11 NOTE — Evaluation (Signed)
Occupational Therapy Evaluation Patient Details Name: Cathy Townsend MRN: [id number] DOB: 09/01/1955 Today's Date: 02/11/2015    History of Present Illness This patient is a 59 year old female who came to [facility] for a Left TKR.    Clinical Impression   This patient is a 59 year old female who came to [facility] for a L total knee replacement.  Patient lives in a 1 story home with 1 step to enter.  She had been independent with ADL and functional mobility and working full time. She now requires  assistance and would benefit from Occupational Therapy for ADL/functioal mobility training.      Follow Up Recommendations       Equipment Recommendations       Recommendations for Other Services       Precautions / Restrictions Precautions Precautions: Knee;Fall Restrictions Weight Bearing Restrictions: Yes LLE Weight Bearing: Weight bearing as tolerated      Mobility Bed Mobility                  Transfers                      Balance                                            ADL                                         General ADL Comments: Patient had been independent with ADL and mobility and works full time, She now needs assist and practiced techniques for lower body dressing using hip kit. She required hand over hand assist secondary to sleepiness with vocal cord for technique.      Vision     Perception     Praxis      Pertinent Vitals/Pain Pain Assessment: 0-10 Pain Score: 4  Pain Location: Left knee     Hand Dominance Right   Extremity/Trunk Assessment Upper Extremity Assessment Upper Extremity Assessment: Overall WFL for tasks assessed   Lower Extremity Assessment Lower Extremity Assessment: Defer to PT evaluation       Communication Communication Communication: No difficulties   Cognition Arousal/Alertness:  (sleepy, knodded off a few times) Behavior During Therapy:  WFL for tasks assessed/performed Overall Cognitive Status: Within Functional Limits for tasks assessed                     General Comments       Exercises       Shoulder Instructions      Home Living Family/patient expects to be discharged to:: Private residence Living Arrangements: Spouse/significant other Available Help at Discharge: Family Type of Home: House Home Access: Stairs to enter Technical brewer of Steps: 2 Entrance Stairs-Rails: Right;Left;Can reach both Home Layout: One level     Bathroom Shower/Tub: Teacher, early years/pre: Standard Bathroom Accessibility: Yes   Home Equipment: Bedside commode;Walker - 2 wheels          Prior Functioning/Environment Level of Independence: Independent        Comments: works full time    OT Diagnosis: Acute pain   OT Problem List:     OT Treatment/Interventions: Self-care/ADL training    OT  Goals(Current goals can be found in the care plan section) Acute Rehab OT Goals Time For Goal Achievement: 02/25/15 Potential to Achieve Goals: Good  OT Frequency: Min 1X/week   Barriers to D/C:            Co-evaluation              End of Session Equipment Utilized During Treatment:  (Hip kit)  Activity Tolerance: Patient tolerated treatment well (with some knodding off ) Patient left: in chair;with call bell/phone within reach;with chair alarm set   Time: 2707-8675 OT Time Calculation (min): 22 min Charges:  OT General Charges $OT Visit: 1 Procedure OT Evaluation $Initial OT Evaluation Tier I: 1 Procedure OT Treatments $Self Care/Home Management : 8-22 mins G-Codes:    Myrene Galas, MS/OTR/L  02/11/2015, 10:50 AM

## 2015-02-11 NOTE — Progress Notes (Signed)
  Subjective: 1 Day Post-Op Procedure(s) (LRB): TOTAL KNEE ARTHROPLASTY (Left) Patient reports pain as mild.   Patient seen in rounds with Dr. Rudene Christians. Patient is well, and has had no acute complaints or problems. The patient is up on the bedside commode this morning, comfortably. Plan is to go Home versus rehabilitation. after hospital stay. He will determine this based on physical therapy and care management. Negative for chest pain and shortness of breath Fever: no Gastrointestinal: Negative for nausea and vomiting  Objective: Vital signs in last 24 hours: Temp:  [96 F (35.6 C)-98.2 F (36.8 C)] 98.2 F (36.8 C) (08/26 0521) Pulse Rate:  [38-81] 81 (08/26 0521) Resp:  [16-22] 20 (08/26 0521) BP: (96-140)/(44-98) 140/60 mmHg (08/26 0521) SpO2:  [94 %-100 %] 94 % (08/26 0521) Weight:  [100.245 kg (221 lb)] 100.245 kg (221 lb) (08/25 0809)  Intake/Output from previous day:  Intake/Output Summary (Last 24 hours) at 02/11/15 0625 Last data filed at 02/11/15 0500  Gross per 24 hour  Intake   1580 ml  Output   1500 ml  Net     80 ml    Intake/Output this shift: Total I/O In: 1340 [I.V.:1340] Out: 1050 [Urine:1050]  Labs:  Recent Labs  02/10/15 1706  HGB 12.1    Recent Labs  02/10/15 1706  WBC 13.0*  RBC 3.83  HCT 36.0  PLT 260    Recent Labs  02/10/15 0850 02/10/15 1506 02/10/15 1706  K 3.9 5.0  --   CREATININE  --   --  0.57   No results for input(s): LABPT, INR in the last 72 hours.   EXAM General - Patient is Alert and Oriented Extremity - Neurovascular intact Dorsiflexion/Plantar flexion intact Compartment soft Dressing/Incision - clean, dry, no drainage Motor Function - intact, moving foot and toes well on exam. The patient is able to do a straight leg raise.  Past Medical History  Diagnosis Date  . PONV (postoperative nausea and vomiting)   . Arthritis     "think I have it in both knees" (05/06/2012)  . Allergy   . Hypertension      Assessment/Plan: 1 Day Post-Op Procedure(s) (LRB): TOTAL KNEE ARTHROPLASTY (Left) Active Problems:   Primary osteoarthritis of knee  Estimated body mass index is 40.41 kg/(m^2) as calculated from the following:   Height as of this encounter: 5\' 2"  (1.575 m).   Weight as of this encounter: 100.245 kg (221 lb). Advance diet Up with therapy  DVT Prophylaxis - Lovenox, Foot Pumps and TED hose Weight-Bearing as tolerated to left leg  Reche Dixon, PA-C Orthopaedic Surgery 02/11/2015, 6:25 AM

## 2015-02-11 NOTE — Progress Notes (Signed)
Pt is alert and oriented. Neuro checks wdl. Not able to tolerate bone foam. Medicated for pain during the night. Iv infusing without difficulty and foley patent and draining urine.

## 2015-02-11 NOTE — Plan of Care (Signed)
Problem: Phase I Progression Outcomes Goal: CMS/Neurovascular status WDL Outcome: Completed/Met Date Met:  02/11/15 Neuro checks wdl. Ambulated to chair at shift change. Goal: Dangle or out of bed evening of surgery Outcome: Completed/Met Date Met:  02/11/15 Dangled out of bed.

## 2015-02-11 NOTE — Evaluation (Signed)
Physical Therapy Evaluation Patient Details Name: Cathy Townsend MRN: [id number] DOB: January 06, 1956 Today's Date: 02/11/2015   History of Present Illness  R TKA 2 years ago, now here with L   Clinical Impression  Pt already up in recliner on arrival and generally was feeling well with minimal pain.  She shows ability to do 5 SLR w/o knee lag and walks well without KI.  She shows good effort with 10 minutes of LE exercises apart from PT exam.    Follow Up Recommendations Home health PT    Equipment Recommendations       Recommendations for Other Services       Precautions / Restrictions Precautions Precautions: Fall Restrictions Weight Bearing Restrictions: Yes LLE Weight Bearing: Weight bearing as tolerated      Mobility  Bed Mobility                  Transfers Overall transfer level: Needs assistance Equipment used: Rolling walker (2 wheeled) Transfers: Sit to/from Stand Sit to Stand: Min assist            Ambulation/Gait Ambulation/Gait assistance: Min assist Ambulation Distance (Feet): 35 Feet Assistive device: Rolling walker (2 wheeled)          Stairs            Wheelchair Mobility    Modified Rankin (Stroke Patients Only)       Balance                                             Pertinent Vitals/Pain Pain Assessment:  (moderate pain, increases with activity) Pain Score: 4  Pain Location: Left knee    Home Living Family/patient expects to be discharged to:: Private residence Living Arrangements: Spouse/significant other Available Help at Discharge: Family Type of Home: House Home Access: Stairs to enter Entrance Stairs-Rails: Right;Left;Can reach both Technical brewer of Steps: 2 Home Layout: One level Home Equipment: Bedside commode;Walker - 2 wheels      Prior Function Level of Independence: Independent         Comments: works full time (standing most of the time)     Hand Dominance    Dominant Hand: Right    Extremity/Trunk Assessment   Upper Extremity Assessment: Overall WFL for tasks assessed           Lower Extremity Assessment: LLE deficits/detail   LLE Deficits / Details: expected post op weakness, she was able to do SLR and showed good effort and control     Communication   Communication: No difficulties  Cognition Arousal/Alertness: Awake/alert Behavior During Therapy: WFL for tasks assessed/performed Overall Cognitive Status: Within Functional Limits for tasks assessed                      General Comments      Exercises Total Joint Exercises Ankle Circles/Pumps: AROM;10 reps Quad Sets: AROM;Strengthening;10 reps Heel Slides: AROM;10 reps Hip ABduction/ADduction: AROM;Strengthening;10 reps Straight Leg Raises: AAROM;AROM;10 reps Goniometric ROM: 4-62      Assessment/Plan    PT Assessment Patient needs continued PT services  PT Diagnosis Difficulty walking;Generalized weakness   PT Problem List Decreased strength;Decreased range of motion;Decreased activity tolerance;Decreased mobility;Decreased balance;Decreased safety awareness  PT Treatment Interventions Gait training;Stair training;Therapeutic activities;Functional mobility training;Therapeutic exercise;Neuromuscular re-education;Balance training;Patient/family education   PT Goals (Current goals can be found in the Care Plan  section) Acute Rehab PT Goals Patient Stated Goal: to get back home PT Goal Formulation: With patient Time For Goal Achievement: 02/25/15 Potential to Achieve Goals: Good    Frequency BID   Barriers to discharge   `    Co-evaluation               End of Session                 Time: 7972-8206 PT Time Calculation (min) (ACUTE ONLY): 36 min   Charges:   PT Evaluation $Initial PT Evaluation Tier I: 1 Procedure PT Treatments $Therapeutic Exercise: 8-22 mins   PT G Codes:       Wayne Both, PT, DPT [phone]  [name] 02/11/2015, 11:31 AM

## 2015-02-11 NOTE — Progress Notes (Signed)
Clinical Social Worker (CSW) received SNF consult. PT is recommending home health. RN Case Manager aware of above. Please reconsult if future social work needs arise. CSW signing off.   Tekisha Darcey Morgan, LCSWA [phone] 

## 2015-02-11 NOTE — Progress Notes (Signed)
Physical Therapy Treatment Patient Details Name: Cathy Townsend MRN: [id number] DOB: 1956/01/23 Today's Date: 02/11/2015    History of Present Illness R TKA 2 years ago, now here with L     PT Comments    Pt is able to ambulate well with walker this afternoon and actually is able to take many steps while maintaining slight forward momentum with wheels showing good confidence and strength no the L LE (no KI).  She has increased pain with exercises/ROM acts, but is not limited by this and generally shows great effort.   Follow Up Recommendations  Home health PT     Equipment Recommendations       Recommendations for Other Services       Precautions / Restrictions Restrictions Weight Bearing Restrictions: Yes LLE Weight Bearing: Weight bearing as tolerated    Mobility  Bed Mobility Overal bed mobility: Modified Independent             General bed mobility comments: Pt able to get L leg both off of and back onto bed with good effort and no direct physical assit  Transfers Overall transfer level: Modified independent Equipment used: Rolling walker (2 wheeled) Transfers: Sit to/from Stand Sit to Stand: Min guard            Ambulation/Gait Ambulation/Gait assistance: Min assist;Min guard Ambulation Distance (Feet): 60 Feet Assistive device: Rolling walker (2 wheeled)           Stairs            Wheelchair Mobility    Modified Rankin (Stroke Patients Only)       Balance                                    Cognition Arousal/Alertness: Awake/alert Behavior During Therapy: WFL for tasks assessed/performed Overall Cognitive Status: Within Functional Limits for tasks assessed                      Exercises Total Joint Exercises Ankle Circles/Pumps: AROM;10 reps Quad Sets: AROM;Strengthening;10 reps Short Arc Quad: AROM;AAROM;10 reps Heel Slides: AROM;10 reps Hip ABduction/ADduction: AROM;Strengthening;10 reps Straight  Leg Raises: AROM;10 reps Goniometric ROM: 73    General Comments        Pertinent Vitals/Pain Pain Assessment: 0-10 Pain Score: 6     Home Living                      Prior Function            PT Goals (current goals can now be found in the care plan section) Progress towards PT goals: Progressing toward goals    Frequency  BID    PT Plan Current plan remains appropriate    Co-evaluation             End of Session Equipment Utilized During Treatment: Gait belt Activity Tolerance: Patient tolerated treatment well Patient left: with bed alarm set     Time: 1400-1426 PT Time Calculation (min) (ACUTE ONLY): 26 min  Charges:  $Gait Training: 8-22 mins $Therapeutic Exercise: 8-22 mins                    G Codes:     Wayne Both, PT, DPT [phone]  [name] 02/11/2015, 3:21 PM

## 2015-02-12 MED ORDER — FLUCONAZOLE 100 MG PO TABS
200.0000 mg | ORAL_TABLET | Freq: Once | ORAL | Status: AC
  Administered 2015-02-12: 200 mg via ORAL
  Filled 2015-02-12: qty 2

## 2015-02-12 MED ORDER — FLUCONAZOLE 100 MG PO TABS
100.0000 mg | ORAL_TABLET | Freq: Once | ORAL | Status: AC
  Administered 2015-02-13: 100 mg via ORAL
  Filled 2015-02-12: qty 1

## 2015-02-12 MED ORDER — TAPENTADOL HCL 50 MG PO TABS: 100.0000 mg | ORAL_TABLET | ORAL | Status: DC | PRN

## 2015-02-12 MED ORDER — ENOXAPARIN SODIUM 40 MG/0.4ML ~~LOC~~ SOLN: 40.0000 mg | Freq: Two times a day (BID) | SUBCUTANEOUS | Status: DC

## 2015-02-12 NOTE — Progress Notes (Signed)
Physical Therapy Treatment Patient Details Name: Cathy Townsend MRN: [id number] DOB: 01-Aug-1955 Today's Date: 02/12/2015    History of Present Illness R TKA 2 years ago, now here with L     PT Comments    Pt continues to make consistent gains, shows improving confidence and is able to engage the quad quite well now.  She has expected pain, but it does not interfere with her function.  Pt does well with steps and has no safety issues t/o session.   Follow Up Recommendations        Equipment Recommendations       Recommendations for Other Services       Precautions / Restrictions Precautions Precautions: Fall Restrictions LLE Weight Bearing: Weight bearing as tolerated    Mobility  Bed Mobility Overal bed mobility: Modified Independent (pt in recliner on arrival)                Transfers Overall transfer level: Modified independent Equipment used: Rolling walker (2 wheeled) Transfers: Sit to/from Stand Sit to Stand: Supervision         General transfer comment: Pt rises with less reliance on UEs and shows good confidence  Ambulation/Gait Ambulation/Gait assistance: Supervision Ambulation Distance (Feet): 150 Feet Assistive device: Rolling walker (2 wheeled)       General Gait Details: Pt with even smoother and more consistent cadence with little issues with fatigue, increased pain or other issues.     Stairs   Stairs assistance: Min guard Stair Management: Two rails Number of Stairs: 4 General stair comments: PT does better with stairs this afternoon and essentially needs no cuing for proper strategy  Wheelchair Mobility    Modified Rankin (Stroke Patients Only)       Balance                                    Cognition Arousal/Alertness: Awake/alert Behavior During Therapy: WFL for tasks assessed/performed Overall Cognitive Status: Within Functional Limits for tasks assessed                      Exercises Total  Joint Exercises Ankle Circles/Pumps: AROM;10 reps Quad Sets: AROM;Strengthening;10 reps Heel Slides: AROM;10 reps Hip ABduction/ADduction: AROM;Strengthening;10 reps Long Arc Quad: AROM;10 reps Knee Flexion: PROM;5 reps    General Comments        Pertinent Vitals/Pain Pain Assessment: 0-10 Pain Score: 5     Home Living                      Prior Function            PT Goals (current goals can now be found in the care plan section) Progress towards PT goals: Progressing toward goals    Frequency  BID    PT Plan Current plan remains appropriate    Co-evaluation             End of Session Equipment Utilized During Treatment: Gait belt Activity Tolerance: Patient tolerated treatment well Patient left: with bed alarm set     Time: 0932-3557 PT Time Calculation (min) (ACUTE ONLY): 24 min  Charges:  $Gait Training: 8-22 mins $Therapeutic Exercise: 8-22 mins                    G Codes:     Wayne Both, PT, DPT [phone]  [name] 02/12/2015, 4:26  PM   

## 2015-02-12 NOTE — Progress Notes (Signed)
  Subjective: 2 Days Post-Op Procedure(s) (LRB): TOTAL KNEE ARTHROPLASTY (Left) Patient reports pain as moderate.   Patient seen in rounds with Dr. Rudene Christians. Patient is well, and has had no acute complaints or problems Plan is to go Home after hospital stay. Negative for chest pain and shortness of breath Fever: no Gastrointestinal: Negative for nausea and vomiting  Objective: Vital signs in last 24 hours: Temp:  [98.4 F (36.9 C)-99.5 F (37.5 C)] 99.5 F (37.5 C) (08/27 0515) Pulse Rate:  [79-87] 86 (08/27 0515) Resp:  [18] 18 (08/27 0515) BP: (128-157)/(62-72) 128/62 mmHg (08/27 0515) SpO2:  [99 %-100 %] 99 % (08/27 0515)  Intake/Output from previous day:  Intake/Output Summary (Last 24 hours) at 02/12/15 0627 Last data filed at 02/12/15 0043  Gross per 24 hour  Intake   2386 ml  Output   1900 ml  Net    486 ml    Intake/Output this shift: Total I/O In: 2146 [I.V.:2146] Out: 350 [Urine:350]  Labs:  Recent Labs  02/10/15 1706 02/11/15 0757  HGB 12.1 12.1    Recent Labs  02/10/15 1706 02/11/15 0757  WBC 13.0* 6.0  RBC 3.83 3.77*  HCT 36.0 35.9  PLT 260 261    Recent Labs  02/10/15 1506 02/10/15 1706 02/11/15 0757  NA  --   --  139  K 5.0  --  3.7  CL  --   --  105  CO2  --   --  26  BUN  --   --  9  CREATININE  --  0.57 0.51  GLUCOSE  --   --  113*  CALCIUM  --   --  9.0   No results for input(s): LABPT, INR in the last 72 hours.   EXAM General - Patient is Alert and Oriented Extremity - Sensation intact distally Dorsiflexion/Plantar flexion intact Incision: dressing C/D/I Dressing/Incision - clean, dry, no drainage, new dressing applied today using a honeycomb dressing. Motor Function - intact, moving foot and toes well on exam. The patient ambulate 60 feet with physical therapy on postop day 1  Past Medical History  Diagnosis Date  . PONV (postoperative nausea and vomiting)   . Arthritis     "think I have it in both knees"  (05/06/2012)  . Allergy   . Hypertension     Assessment/Plan: 2 Days Post-Op Procedure(s) (LRB): TOTAL KNEE ARTHROPLASTY (Left) Active Problems:   Primary osteoarthritis of knee  Estimated body mass index is 40.41 kg/(m^2) as calculated from the following:   Height as of this encounter: 5\' 2"  (1.575 m).   Weight as of this encounter: 100.245 kg (221 lb). Plan for discharge tomorrow  DVT Prophylaxis - Lovenox, Foot Pumps and TED hose Weight-Bearing as tolerated to left leg  Reche Dixon, PA-C Orthopaedic Surgery 02/12/2015, 6:27 AM

## 2015-02-12 NOTE — Progress Notes (Signed)
RN SPOKE WITH DR [facility] AFTER PT REPORTS VAGINAL DISCHARGE AND ITCHING . MD REPORTS ADM DIFLUCAN 200MG  PO TODAY AND 100MG  PO IN AM

## 2015-02-12 NOTE — Progress Notes (Signed)
Physical Therapy Treatment Patient Details Name: Cathy Townsend MRN: [id number] DOB: [birth year] Today's Date: 02/12/2015    History of Present Illness R TKA 2 years ago, now here with L     PT Comments    Pt continues to do well with PT showing some increased quad control, less overall pain even with PROM and today she was able to ambulate >100 ft with FWW and no real hesitancy with WBing on the L showing consistent forward momentum with the walker.  Pt negotiates up/down steps safely.   Follow Up Recommendations        Equipment Recommendations       Recommendations for Other Services       Precautions / Restrictions Precautions Precautions: Fall Restrictions Weight Bearing Restrictions: Yes LLE Weight Bearing: Weight bearing as tolerated    Mobility  Bed Mobility Overal bed mobility:  (pt in recliner on arrival)                Transfers Overall transfer level: Modified independent Equipment used: Rolling walker (2 wheeled) Transfers: Sit to/from Stand Sit to Stand: Min guard            Ambulation/Gait Ambulation/Gait assistance: Min guard Ambulation Distance (Feet): 125 Feet Assistive device: Rolling walker (2 wheeled)           Stairs Stairs: Yes Stairs assistance: Min guard Stair Management: Two rails Number of Stairs: 4 General stair comments: Pt needs brief reminders on step-to and which foot to lead with but ultiamtely is safe and confident with stairs  Wheelchair Mobility    Modified Rankin (Stroke Patients Only)       Balance                                    Cognition Arousal/Alertness: Awake/alert Behavior During Therapy: WFL for tasks assessed/performed Overall Cognitive Status: Within Functional Limits for tasks assessed                      Exercises Total Joint Exercises Ankle Circles/Pumps: AROM;10 reps Quad Sets: AROM;Strengthening;10 reps Heel Slides: AROM;10 reps Hip  ABduction/ADduction: AROM;Strengthening;10 reps Long Arc Quad: AROM;10 reps Knee Flexion: PROM;5 reps Goniometric ROM: 77    General Comments        Pertinent Vitals/Pain Pain Assessment: 0-10 Pain Score: 4     Home Living                      Prior Function            PT Goals (current goals can now be found in the care plan section) Progress towards PT goals: Progressing toward goals    Frequency  BID    PT Plan Current plan remains appropriate    Co-evaluation             End of Session Equipment Utilized During Treatment: Gait belt Activity Tolerance: Patient tolerated treatment well Patient left: with bed alarm set     Time: 7829-5621 PT Time Calculation (min) (ACUTE ONLY): 26 min  Charges:  $Gait Training: 8-22 mins $Therapeutic Exercise: 8-22 mins                    G Codes:     Wayne Both, PT, DPT [phone]  [name] 02/12/2015, 10:17 AM

## 2015-02-13 NOTE — Progress Notes (Signed)
  Subjective: 3 Days Post-Op Procedure(s) (LRB): TOTAL KNEE ARTHROPLASTY (Left) Patient reports pain as mild.   Patient seen in rounds with Dr. Rudene Christians. Patient is well, and has had no acute complaints or problems. patient slept well last night. Plan is to go Home after hospital stay. Negative for chest pain and shortness of breath Fever: no Gastrointestinal: Negative for nausea and vomiting  Objective: Vital signs in last 24 hours: Temp:  [98.4 F (36.9 C)-98.9 F (37.2 C)] 98.4 F (36.9 C) (08/28 0523) Pulse Rate:  [82-86] 82 (08/28 0523) Resp:  [16-20] 18 (08/28 0523) BP: (119-123)/(53-61) 119/61 mmHg (08/28 0523) SpO2:  [100 %] 100 % (08/28 0523)  Intake/Output from previous day:  Intake/Output Summary (Last 24 hours) at 02/13/15 6629 Last data filed at 02/12/15 1844  Gross per 24 hour  Intake    480 ml  Output      0 ml  Net    480 ml    Intake/Output this shift:    Labs:  Recent Labs  02/10/15 1706 02/11/15 0757  HGB 12.1 12.1    Recent Labs  02/10/15 1706 02/11/15 0757  WBC 13.0* 6.0  RBC 3.83 3.77*  HCT 36.0 35.9  PLT 260 261    Recent Labs  02/10/15 1506 02/10/15 1706 02/11/15 0757  NA  --   --  139  K 5.0  --  3.7  CL  --   --  105  CO2  --   --  26  BUN  --   --  9  CREATININE  --  0.57 0.51  GLUCOSE  --   --  113*  CALCIUM  --   --  9.0   No results for input(s): LABPT, INR in the last 72 hours.   EXAM General - Patient is Alert and Oriented Extremity - Neurovascular intact Dorsiflexion/Plantar flexion intact No cellulitis present Compartment soft Dressing/Incision - clean, dry, no drainage Motor Function - intact, moving foot and toes well on exam. The patient is ambulating 150 feet and doing stairs.  Past Medical History  Diagnosis Date  . PONV (postoperative nausea and vomiting)   . Arthritis     "think I have it in both knees" (05/06/2012)  . Allergy   . Hypertension     Assessment/Plan: 3 Days Post-Op Procedure(s)  (LRB): TOTAL KNEE ARTHROPLASTY (Left) Active Problems:   Primary osteoarthritis of knee  Estimated body mass index is 40.41 kg/(m^2) as calculated from the following:   Height as of this encounter: 5\' 2"  (1.575 m).   Weight as of this encounter: 100.245 kg (221 lb). Discharge home with home health  DVT Prophylaxis - Lovenox, Foot Pumps and TED hose Weight-Bearing as tolerated to left leg  Reche Dixon, PA-C Orthopaedic Surgery 02/13/2015, 6:08 AM

## 2015-02-13 NOTE — Discharge Instructions (Signed)
TOTAL KNEE REPLACEMENT POSTOPERATIVE DIRECTIONS ° °Knee Rehabilitation, Guidelines Following Surgery  °Results after knee surgery are often greatly improved when you follow the exercise, range of motion and muscle strengthening exercises prescribed by your doctor. Safety measures are also important to protect the knee from further injury. Any time any of these exercises cause you to have increased pain or swelling in your knee joint, decrease the amount until you are comfortable again and slowly increase them. If you have problems or questions, call your caregiver or physical therapist for advice.  ° °HOME CARE INSTRUCTIONS  °Remove items at home which could result in a fall. This includes throw rugs or furniture in walking pathways.  °· ICE using the Polar Care unit to the affected knee every three hours for 30 minutes at a time and then as needed for pain and swelling.  Place a dry towel or pillow case over the knee before applying the Polar Care Unit.  Continue to use ice on the knee for pain and swelling from surgery. You may notice swelling that will progress down to the foot and ankle.  This is normal after surgery.  Elevate the leg when you are not up walking on it.   °· Continue to use the breathing machine which will help keep your temperature down.  It is common for your temperature to cycle up and down following surgery, especially at night when you are not up moving around and exerting yourself.  The breathing machine keeps your lungs expanded and your temperature down. °· Do not place pillow under knee, focus on keeping the knee straight while resting ° °DIET °You may resume your previous home diet once your are discharged from the hospital. ° °DRESSING / WOUND CARE / SHOWERING °Keep the surgical dressing until follow up.  The dressing is water proof, so you can shower without any extra covering.  IF THE DRESSING FALLS OFF or the wound gets wet inside, change the dressing with sterile gauze.  Please use  good hand washing techniques before changing the dressing.  Do not use any lotions or creams on the incision until instructed by your surgeon.   °You need to keep your wound dry after being discharged home.  Just keep the incision dry and apply a dry gauze dressing on daily. °Change the surgical dressing only if needed and reapply a dry dressing each time. ° °ACTIVITY °Walk with your walker as instructed. °Use walker as long as suggested by your caregivers. °Avoid periods of inactivity such as sitting longer than an hour when not asleep. This helps prevent blood clots.  °You may resume a sexual relationship in one month or when given the OK by your doctor.  °You may return to work once you are cleared by your doctor.  °Do not drive a car for 6 weeks or until released by you surgeon.  °Do not drive while taking narcotics. ° °WEIGHT BEARING °Weight bearing as tolerated with assist device (walker, cane, etc) as directed, use it as long as suggested by your surgeon or therapist, typically at least 4-6 weeks. ° °POSTOPERATIVE CONSTIPATION PROTOCOL °Constipation - defined medically as fewer than three stools per week and severe constipation as less than one stool per week. ° °One of the most common issues patients have following surgery is constipation.  Even if you have a regular bowel pattern at home, your normal regimen is likely to be disrupted due to multiple reasons following surgery.  Combination of anesthesia, postoperative narcotics, change in   appetite and fluid intake all can affect your bowels.  In order to avoid complications following surgery, here are some recommendations in order to help you during your recovery period. ° °Colace (docusate) - Pick up an over-the-counter form of Colace or another stool softener and take twice a day as long as you are requiring postoperative pain medications.  Take with a full glass of water daily.  If you experience loose stools or diarrhea, hold the colace until you stool  forms back up.  If your symptoms do not get better within 1 week or if they get worse, check with your doctor. ° °Dulcolax (bisacodyl) - Pick up over-the-counter and take as directed by the product packaging as needed to assist with the movement of your bowels.  Take with a full glass of water.  Use this product as needed if not relieved by Colace only.  ° °MiraLax (polyethylene glycol) - Pick up over-the-counter to have on hand.  MiraLax is a solution that will increase the amount of water in your bowels to assist with bowel movements.  Take as directed and can mix with a glass of water, juice, soda, coffee, or tea.  Take if you go more than two days without a movement. °Do not use MiraLax more than once per day. Call your doctor if you are still constipated or irregular after using this medication for 7 days in a row. ° °If you continue to have problems with postoperative constipation, please contact the office for further assistance and recommendations.  If you experience "the worst abdominal pain ever" or develop nausea or vomiting, please contact the office immediatly for further recommendations for treatment. ° °ITCHING ° If you experience itching with your medications, try taking only a single pain pill, or even half a pain pill at a time.  You can also use Benadryl over the counter for itching or also to help with sleep.  ° °TED HOSE STOCKINGS °Wear the elastic stockings on both legs for six weeks following surgery during the day but you may remove then at night for sleeping. ° °MEDICATIONS °See your medication summary on the “After Visit Summary” that the nursing staff will review with you prior to discharge.  You may have some home medications which will be placed on hold until you complete the course of blood thinner medication.  It is important for you to complete the blood thinner medication as prescribed by your surgeon.  Continue your approved medications as instructed at time of  discharge. ° °PRECAUTIONS °If you experience chest pain or shortness of breath - call 911 immediately for transfer to the hospital emergency department.  °If you develop a fever greater that 101 F, purulent drainage from wound, increased redness or drainage from wound, foul odor from the wound/dressing, or calf pain - CONTACT YOUR SURGEON.   °                                                °FOLLOW-UP APPOINTMENTS °Make sure you keep all of your appointments after your operation with your surgeon and caregivers. You should call the office at the above phone number and make an appointment for approximately two weeks after the date of your surgery or on the date instructed by your surgeon outlined in the "After Visit Summary". ° ° °RANGE OF MOTION AND STRENGTHENING EXERCISES  °Rehabilitation   of the knee is important following a knee injury or an operation. After just a few days of immobilization, the muscles of the thigh which control the knee become weakened and shrink (atrophy). Knee exercises are designed to build up the tone and strength of the thigh muscles and to improve knee motion. Often times heat used for twenty to thirty minutes before working out will loosen up your tissues and help with improving the range of motion but do not use heat for the first two weeks following surgery. These exercises can be done on a training (exercise) mat, on the floor, on a table or on a bed. Use what ever works the best and is most comfortable for you Knee exercises include:  Leg Lifts - While your knee is still immobilized in a splint or cast, you can do straight leg raises. Lift the leg to 60 degrees, hold for 3 sec, and slowly lower the leg. Repeat 10-20 times 2-3 times daily. Perform this exercise against resistance later as your knee gets better.  Quad and Hamstring Sets - Tighten up the muscle on the front of the thigh (Quad) and hold for 5-10 sec. Repeat this 10-20 times hourly. Hamstring sets are done by pushing the  foot backward against an object and holding for 5-10 sec. Repeat as with quad sets.   Leg Slides: Lying on your back, slowly slide your foot toward your buttocks, bending your knee up off the floor (only go as far as is comfortable). Then slowly slide your foot back down until your leg is flat on the floor again.  Angel Wings: Lying on your back spread your legs to the side as far apart as you can without causing discomfort.  A rehabilitation program following serious knee injuries can speed recovery and prevent re-injury in the future due to weakened muscles. Contact your doctor or a physical therapist for more information on knee rehabilitation.   IF YOU ARE TRANSFERRED TO A SKILLED REHAB FACILITY If the patient is transferred to a skilled rehab facility following release from the hospital, a list of the current medications will be sent to the facility for the patient to continue.  When discharged from the skilled rehab facility, please have the facility set up the patient's [place] prior to being released. Also, the skilled facility will be responsible for providing the patient with their medications at time of release from the facility to include their pain medication, the muscle relaxants, and their blood thinner medication. If the patient is still at the rehab facility at time of the two week follow up appointment, the skilled rehab facility will also need to assist the patient in arranging follow up appointment in our office and any transportation needs.  MAKE SURE YOU:  Understand these instructions.  Get help right away if you are not doing well or get worse.    Pick up stool softner and laxative for home use following surgery while on pain medications. Do not submerge incision under water. Please use good hand washing techniques while changing dressing each day. May shower starting three days after surgery. Please use a clean towel to pat the incision dry following  showers. Continue to use ice for pain and swelling after surgery. Do not use any lotions or creams on the incision until instructed by your surgeon.  Start Lovenox 40mg  injection tomorrow 02/14/15 once a day.

## 2015-02-13 NOTE — Care Management Note (Signed)
Case Management Note  Patient Details  Name: Cathy Townsend MRN: [id number] Date of Birth: 1956-06-14  Subjective/Objective:             Cathy Townsend chose [facility] as her home health provider from a list of providers. A referral was faxed and called to [facility] for PT.        Action/Plan:   Expected Discharge Date:                  Expected Discharge Plan:     In-House Referral:     Discharge planning Services  CM Consult  Post Acute Care Choice:  Home Health Choice offered to:  Patient, Spouse  DME Arranged:    DME Agency:     HH Arranged:    HH Agency:     Status of Service:  In process, will continue to follow  Medicare Important Message Given:    Date Medicare IM Given:    Medicare IM give by:    Date Additional Medicare IM Given:    Additional Medicare Important Message give by:     If discussed at [place] of Stay Meetings, dates discussed:    Additional Comments:  Delayne Sanzo A, RN 02/13/2015, 11:21 AM

## 2015-02-13 NOTE — Progress Notes (Signed)
Discharge instructions give per MD order, home and new medications reviewed with patient, Rx.slip given for lovenox and nucynta. Waiting for spouse.

## 2015-02-13 NOTE — Discharge Summary (Signed)
Physician Discharge Summary  Subjective: 3 Days Post-Op Procedure(s) (LRB): TOTAL KNEE ARTHROPLASTY (Left) Patient reports pain as mild.   Patient seen in rounds with Dr. Rudene Christians. Patient is well, and has had no acute complaints or problems Patient is ready to go home with home health physical therapy today.  Physician Discharge Summary  Patient ID: Cathy Townsend MRN: [id number] DOB/AGE: Jun 13, 1956 59 y.o.  Admit date: 02/10/2015 Discharge date: 02/13/2015  Admission Diagnoses:  Discharge Diagnoses:  Active Problems:   Primary osteoarthritis of knee   Discharged Condition: good  Hospital Course: The patient is status post left total knee replacement done 3 days ago. She has done well at the hospital. The patient worked with   Physical therapy and ambulated up to 150 feet including 4 stairs. The patient had no specific complications, although she was complaining of some vaginal discharge and was given Diflucan. The patient did well and was ready to go home on postoperative day 3.   Treatments: surgery:  PROCEDURE: Procedure(s): TOTAL KNEE ARTHROPLASTY (Left)  SURGEON: Laurene Footman, MD  ASSISTANTS: Rachelle Hora [facility]  ANESTHESIA: spinal  EBL: Total I/O In: 1100 [I.V.:1100] Out: 550 [Urine:500; Blood:50]  BLOOD ADMINISTERED:none  DRAINS: none   LOCAL MEDICATIONS USED: MARCAINE and OTHER Toradol morphine and Exparel  SPECIMEN: Source of Specimen: Cut ends of bone left knee  DISPOSITION OF SPECIMEN: PATHOLOGY  COUNTS: YES  TOURNIQUET: 93 minutes at 300 mmHg  IMPLANTS: Medacta GMK sphere left 2 femur, 3 left tibia with 02 mm insert and 2 patella all components cemented  Discharge Exam: Blood pressure 119/61, pulse 82, temperature 98.4 F (36.9 C), temperature source Oral, resp. rate 18, height 5\' 2"  (1.575 m), weight 100.245 kg (221 lb), SpO2 100 %.   Disposition: 01-Home or Self Care     Medication List    STOP taking these medications         aspirin 325 MG EC tablet     ibuprofen 800 MG tablet  Commonly known as:  ADVIL,MOTRIN      TAKE these medications        azithromycin 250 MG tablet  Commonly known as:  ZITHROMAX  Take 2 tablets PO on day one, and one tablet PO daily thereafter until completed.     enoxaparin 40 MG/0.4ML injection  Commonly known as:  LOVENOX  Inject 0.4 mLs (40 mg total) into the skin every 12 (twelve) hours.     hydrochlorothiazide 12.5 MG capsule  Commonly known as:  MICROZIDE  Take 1-2 a day     HYDROcodone-homatropine 5-1.5 MG/5ML syrup  Commonly known as:  HYCODAN  Take 5 mLs by mouth every 8 (eight) hours as needed for cough.     loratadine 10 MG tablet  Commonly known as:  CLARITIN  Take 10 mg by mouth daily.     montelukast 10 MG tablet  Commonly known as:  SINGULAIR  Take 1 tablet (10 mg total) by mouth daily.     potassium chloride 10 MEQ tablet  Commonly known as:  K-DUR,KLOR-CON  Take 10 mEq by mouth 2 (two) times daily.     tapentadol 50 MG Tabs tablet  Commonly known as:  NUCYNTA  Take 2 tablets (100 mg total) by mouth every 4 (four) hours as needed for moderate pain.           Follow-up Information    Follow up with MENZ,MICHAEL, MD. Go in 2 weeks.   Specialty:  Orthopedic Surgery   Why:  For  staple removal   Contact information:   [place] [address] [phone]       Signed: Prescott Parma, Moris Ratchford 02/13/2015, 6:10 AM   Objective: Vital signs in last 24 hours: Temp:  [98.4 F (36.9 C)-98.9 F (37.2 C)] 98.4 F (36.9 C) (08/28 0523) Pulse Rate:  [82-86] 82 (08/28 0523) Resp:  [16-20] 18 (08/28 0523) BP: (119-123)/(53-61) 119/61 mmHg (08/28 0523) SpO2:  [100 %] 100 % (08/28 0523)  Intake/Output from previous day:  Intake/Output Summary (Last 24 hours) at 02/13/15 0610 Last data filed at 02/12/15 1844  Gross per 24 hour  Intake    480 ml  Output      0 ml  Net    480 ml    Intake/Output this shift:    Labs:  Recent  Labs  02/10/15 1706 02/11/15 0757  HGB 12.1 12.1    Recent Labs  02/10/15 1706 02/11/15 0757  WBC 13.0* 6.0  RBC 3.83 3.77*  HCT 36.0 35.9  PLT 260 261    Recent Labs  02/10/15 1506 02/10/15 1706 02/11/15 0757  NA  --   --  139  K 5.0  --  3.7  CL  --   --  105  CO2  --   --  26  BUN  --   --  9  CREATININE  --  0.57 0.51  GLUCOSE  --   --  113*  CALCIUM  --   --  9.0   No results for input(s): LABPT, INR in the last 72 hours.  EXAM: General - Patient is Alert and Oriented Extremity - Neurovascular intact Dorsiflexion/Plantar flexion intact No cellulitis present Compartment soft Incision - clean, dry, no drainage Motor Function -  the patient was able to do a straight leg raise. The patient was ambulating around the nurse's station.   Assessment/Plan: 3 Days Post-Op Procedure(s) (LRB): TOTAL KNEE ARTHROPLASTY (Left) Procedure(s) (LRB): TOTAL KNEE ARTHROPLASTY (Left) Past Medical History  Diagnosis Date  . PONV (postoperative nausea and vomiting)   . Arthritis     "think I have it in both knees" (05/06/2012)  . Allergy   . Hypertension    Active Problems:   Primary osteoarthritis of knee  Estimated body mass index is 40.41 kg/(m^2) as calculated from the following:   Height as of this encounter: 5\' 2"  (1.575 m).   Weight as of this encounter: 100.245 kg (221 lb). Discharge home with home health Diet - Regular diet Follow up - in 2 weeks Activity - WBAT Disposition - Home Condition Upon Discharge - Good DVT Prophylaxis - Lovenox and TED hose  Reche Dixon, PA-C Orthopaedic Surgery 02/13/2015, 6:10 AM

## 2015-02-15 LAB — SURGICAL PATHOLOGY

## 2015-04-04 ENCOUNTER — Other Ambulatory Visit: Payer: Self-pay

## 2015-04-04 ENCOUNTER — Ambulatory Visit (INDEPENDENT_AMBULATORY_CARE_PROVIDER_SITE_OTHER): Payer: BLUE CROSS/BLUE SHIELD | Admitting: Physician Assistant

## 2015-04-04 ENCOUNTER — Encounter: Payer: Self-pay | Admitting: Physician Assistant

## 2015-04-04 VITALS — BP 100/60 | HR 86 | Temp 98.5°F | Resp 16 | Wt 203.8 lb

## 2015-04-04 DIAGNOSIS — B379 Candidiasis, unspecified: Secondary | ICD-10-CM | POA: Diagnosis not present

## 2015-04-04 DIAGNOSIS — J0141 Acute recurrent pansinusitis: Secondary | ICD-10-CM | POA: Diagnosis not present

## 2015-04-04 DIAGNOSIS — R059 Cough, unspecified: Secondary | ICD-10-CM

## 2015-04-04 DIAGNOSIS — T3695XA Adverse effect of unspecified systemic antibiotic, initial encounter: Secondary | ICD-10-CM

## 2015-04-04 DIAGNOSIS — R05 Cough: Secondary | ICD-10-CM

## 2015-04-04 DIAGNOSIS — J309 Allergic rhinitis, unspecified: Secondary | ICD-10-CM | POA: Diagnosis not present

## 2015-04-04 MED ORDER — HYDROCODONE-HOMATROPINE 5-1.5 MG/5ML PO SYRP
5.0000 mL | ORAL_SOLUTION | ORAL | Status: DC | PRN
Start: 2015-04-04 — End: 2015-04-11

## 2015-04-04 MED ORDER — AZITHROMYCIN 250 MG PO TABS
ORAL_TABLET | ORAL | Status: DC
Start: 2015-04-04 — End: 2015-07-12

## 2015-04-04 MED ORDER — FLUCONAZOLE 150 MG PO TABS: ORAL_TABLET | ORAL | Status: DC

## 2015-04-04 NOTE — Patient Instructions (Signed)
Sinusitis, Adult Sinusitis is redness, soreness, and inflammation of the paranasal sinuses. Paranasal sinuses are air pockets within the bones of your face. They are located beneath your eyes, in the middle of your forehead, and above your eyes. In healthy paranasal sinuses, mucus is able to drain out, and air is able to circulate through them by way of your nose. However, when your paranasal sinuses are inflamed, mucus and air can become trapped. This can allow bacteria and other germs to grow and cause infection. Sinusitis can develop quickly and last only a short time (acute) or continue over a long period (chronic). Sinusitis that lasts for more than 12 weeks is considered chronic. CAUSES Causes of sinusitis include:  Allergies.  Structural abnormalities, such as displacement of the cartilage that separates your nostrils (deviated septum), which can decrease the air flow through your nose and sinuses and affect sinus drainage.  Functional abnormalities, such as when the small hairs (cilia) that line your sinuses and help remove mucus do not work properly or are not present. SIGNS AND SYMPTOMS Symptoms of acute and chronic sinusitis are the same. The primary symptoms are pain and pressure around the affected sinuses. Other symptoms include:  Upper toothache.  Earache.  Headache.  Bad breath.  Decreased sense of smell and taste.  A cough, which worsens when you are lying flat.  Fatigue.  Fever.  Thick drainage from your nose, which often is green and may contain pus (purulent).  Swelling and warmth over the affected sinuses. DIAGNOSIS Your health care provider will perform a physical exam. During your exam, your health care provider may perform any of the following to help determine if you have acute sinusitis or chronic sinusitis:  Look in your nose for signs of abnormal growths in your nostrils (nasal polyps).  Tap over the affected sinus to check for signs of  infection.  View the inside of your sinuses using an imaging device that has a light attached (endoscope). If your health care provider suspects that you have chronic sinusitis, one or more of the following tests may be recommended:  Allergy tests.  Nasal culture. A sample of mucus is taken from your nose, sent to a lab, and screened for bacteria.  Nasal cytology. A sample of mucus is taken from your nose and examined by your health care provider to determine if your sinusitis is related to an allergy. TREATMENT Most cases of acute sinusitis are related to a viral infection and will resolve on their own within 10 days. Sometimes, medicines are prescribed to help relieve symptoms of both acute and chronic sinusitis. These may include pain medicines, decongestants, nasal steroid sprays, or saline sprays. However, for sinusitis related to a bacterial infection, your health care provider will prescribe antibiotic medicines. These are medicines that will help kill the bacteria causing the infection. Rarely, sinusitis is caused by a fungal infection. In these cases, your health care provider will prescribe antifungal medicine. For some cases of chronic sinusitis, surgery is needed. Generally, these are cases in which sinusitis recurs more than 3 times per year, despite other treatments. HOME CARE INSTRUCTIONS  Drink plenty of water. Water helps thin the mucus so your sinuses can drain more easily.  Use a humidifier.  Inhale steam 3-4 times a day (for example, sit in the bathroom with the shower running).  Apply a warm, moist washcloth to your face 3-4 times a day, or as directed by your health care provider.  Use saline nasal sprays to help   moisten and clean your sinuses.  Take medicines only as directed by your health care provider.  If you were prescribed either an antibiotic or antifungal medicine, finish it all even if you start to feel better. SEEK IMMEDIATE MEDICAL CARE IF:  You have  increasing pain or severe headaches.  You have nausea, vomiting, or drowsiness.  You have swelling around your face.  You have vision problems.  You have a stiff neck.  You have difficulty breathing.   This information is not intended to replace advice given to you by your health care provider. Make sure you discuss any questions you have with your health care provider.   Document Released: 06/04/2005 Document Revised: 06/25/2014 Document Reviewed: 06/19/2011 Elsevier Interactive Patient Education 2016 [employer] Cough, Adult Coughing is a reflex that clears your throat and your airways. Coughing helps to heal and protect your lungs. It is normal to cough occasionally, but a cough that happens with other symptoms or lasts a long time may be a sign of a condition that needs treatment. A cough may last only 2-3 weeks (acute), or it may last longer than 8 weeks (chronic). CAUSES Coughing is commonly caused by:  Breathing in substances that irritate your lungs.  A viral or bacterial respiratory infection.  Allergies.  Asthma.  Postnasal drip.  Smoking.  Acid backing up from the stomach into the esophagus (gastroesophageal reflux).  Certain medicines.  Chronic lung problems, including COPD (or rarely, lung cancer).  Other medical conditions such as heart failure. HOME CARE INSTRUCTIONS  Pay attention to any changes in your symptoms. Take these actions to help with your discomfort:  Take medicines only as told by your health care provider.  If you were prescribed an antibiotic medicine, take it as told by your health care provider. Do not stop taking the antibiotic even if you start to feel better.  Talk with your health care provider before you take a cough suppressant medicine.  Drink enough fluid to keep your urine clear or pale yellow.  If the air is dry, use a cold steam vaporizer or humidifier in your bedroom or your home to help loosen secretions.  Avoid  anything that causes you to cough at work or at home.  If your cough is worse at night, try sleeping in a semi-upright position.  Avoid cigarette smoke. If you smoke, quit smoking. If you need help quitting, ask your health care provider.  Avoid caffeine.  Avoid alcohol.  Rest as needed. SEEK MEDICAL CARE IF:   You have new symptoms.  You cough up pus.  Your cough does not get better after 2-3 weeks, or your cough gets worse.  You cannot control your cough with suppressant medicines and you are losing sleep.  You develop pain that is getting worse or pain that is not controlled with pain medicines.  You have a fever.  You have unexplained weight loss.  You have night sweats. SEEK IMMEDIATE MEDICAL CARE IF:  You cough up blood.  You have difficulty breathing.  Your heartbeat is very fast.   This information is not intended to replace advice given to you by your health care provider. Make sure you discuss any questions you have with your health care provider.   Document Released: 12/01/2010 Document Revised: 02/23/2015 Document Reviewed: 08/11/2014 Elsevier Interactive Patient Education [employer].

## 2015-04-04 NOTE — Progress Notes (Signed)
[name] Female    DOB: 1956-06-05   59 y.o.   MRN: [id number] Visit Date: 04/04/2015  Today's Provider: Mar Daring, PA-C   Chief Complaint  Patient presents with  . URI   Subjective:    URI  This is a new problem. The current episode started yesterday. The problem has been gradually worsening. There has been no fever. Associated symptoms include congestion, coughing, rhinorrhea and sneezing. Pertinent negatives include no chest pain, ear pain, headaches, plugged ear sensation, sinus pain, sore throat or wheezing. She has tried nothing for the symptoms.  She does have recurrent sinusitis. She does take allergy medication to try to prevent these without relief. As also been allergy tested. She does quickly go from having sinusitis to bronchitis thus we normally treat her quickly when she develops symptoms.     Allergies  Allergen Reactions  . Oxycodone-Acetaminophen Itching  . Tramadol Itching  . Meloxicam Rash   Previous Medications   ASPIRIN EC 81 MG TABLET    Take 81 mg by mouth.   CETIRIZINE (ZYRTEC) 10 MG TABLET    TAKE 1 TABLET (10 MG TOTAL) BY MOUTH DAILY.   FLUTICASONE (FLONASE) 50 MCG/ACT NASAL SPRAY    2 sprays.   HYDROCHLOROTHIAZIDE (MICROZIDE) 12.5 MG CAPSULE    Take 1-2 a day   IBUPROFEN (ADVIL,MOTRIN) 800 MG TABLET       KLOR-CON 10 10 MEQ TABLET    TAKE 1 TABLET (10 MEQ TOTAL) BY MOUTH 2 (TWO) TIMES DAILY.   LORATADINE (CLARITIN) 10 MG TABLET    Take 10 mg by mouth daily.   MONTELUKAST (SINGULAIR) 10 MG TABLET    Take 1 tablet (10 mg total) by mouth daily.   TRIAMCINOLONE (KENALOG) 0.025 % CREAM        Review of Systems  Constitutional: Negative for fatigue.  HENT: Positive for congestion, postnasal drip, rhinorrhea and sneezing. Negative for ear discharge, ear pain and sore throat.   Eyes: Negative for discharge and itching.  Respiratory: Positive for cough. Negative for chest tightness and wheezing.   Cardiovascular: Negative  for chest pain and palpitations.  Neurological: Negative for headaches.    Social History  Substance Use Topics  . Smoking status: Current Every Day Smoker -- 0.50 packs/day for 40 years    Types: Cigarettes  . Smokeless tobacco: Never Used  . Alcohol Use: 7.2 oz/week    12 Cans of beer per week     Comment: 05/06/2012 "3 cans of beer @ a time; more than that sometimes on weekends when I'm not working"   Objective:   BP 100/60 mmHg  Pulse 86  Temp(Src) 98.5 F (36.9 C) (Oral)  Resp 16  Wt 203 lb 12.8 oz (92.443 kg)  SpO2 98%  Physical Exam  Constitutional: She appears well-developed and well-nourished. No distress.  HENT:  Head: Normocephalic and atraumatic.  Right Ear: Hearing, tympanic membrane, external ear and ear canal normal.  Left Ear: Hearing, tympanic membrane, external ear and ear canal normal.  Nose: Mucosal edema and rhinorrhea present. Right sinus exhibits maxillary sinus tenderness and frontal sinus tenderness. Left sinus exhibits maxillary sinus tenderness and frontal sinus tenderness.  Mouth/Throat: Uvula is midline, oropharynx is clear and moist and mucous membranes are normal. No oropharyngeal exudate.  Eyes: Conjunctivae and EOM are normal. Pupils are equal, round, and reactive to light. Right eye exhibits no discharge. Left eye exhibits no discharge.  Neck: Normal range of motion. Neck  supple. No tracheal deviation present. No Brudzinski's sign and no Kernig's sign noted.  Cardiovascular: Normal rate and regular rhythm.  Exam reveals no gallop and no friction rub.   Murmur heard.  Systolic (harsh) murmur is present with a grade of 2/6  Pulmonary/Chest: Effort normal. No accessory muscle usage or stridor. No respiratory distress. She has decreased breath sounds (throughout). She has no wheezes. She has no rales. She exhibits no tenderness.  Lymphadenopathy:    She has no cervical adenopathy.  Skin: Skin is warm and dry. She is not diaphoretic.  Vitals  reviewed.       Assessment & Plan:     1. Allergic rhinitis, unspecified allergic rhinitis type Worsening. She always gets flares in the spring and fall. She is currently still taking her Claritin, Flonase and montelukast. She is to continue these to prevent worsening of the sinus infection.  2. Acute recurrent pansinusitis Recurrent and worsening. She gets frequent sinus infections with her flares of her allergies. She does take her allergy medications daily as prescribed. She has previously done well with treatment with a Z-Pak. I will prescribe for her azithromycin 250 mg take 2 tablets on day 1 and 1 tablet daily thereafter until completed. She is to call the office if symptoms fail to improve or worsen.  3. Cough Most likely secondary to postnasal drainage. Lungs today were clear to auscultation but breath sounds were slightly decreased throughout. There were no adventitious lung sounds heard. She does have a tendency to go from sinusitis to bronchitis quickly thus I will treat her with the azithromycin as above and will also give her Hycodan cough syrup for nighttime cough. She is to call the office if symptoms fail to improve or worsen.  4. Antibiotic-induced yeast infection She often gets antibiotic-induced yeast infections when she takes azithromycin. I will go ahead and call in Diflucan 150 mg to take 1 tablet once at the beginnings of symptoms of a yeast infection with a second tablet to be taken 3 days later if necessary.       Mar Daring, PA-C  [facility]

## 2015-04-06 ENCOUNTER — Telehealth: Payer: Self-pay | Admitting: Physician Assistant

## 2015-04-06 NOTE — Telephone Encounter (Signed)
Pt states the Rx for HYDROcodone-homatropine (HYDROMET) 5-1.5 MG/5ML syrup is not helping with the cough.  Pt is requesting a different Rx to help with her cough.  Pt states what she has had before was yellow and think. [facility]. VK#[phone]

## 2015-04-06 NOTE — Telephone Encounter (Signed)
I dont know what has changed but that is the same medication I prescribed for her in August.

## 2015-04-07 NOTE — Telephone Encounter (Signed)
Called patient and advised as directed  Below. FYI:  Per patient doesn't think is the same one because the previous one was Yellow and thick and this one is red.  Per patient not to worry about it, she will just take what she has.  Called the pharmacy to verify the medicine patient was given. Per pharmacist patient receive the hydrocodone-homatropine as prescribed and it is like a reddish color with cherry flavor and is the same one patient got back in August. Per pharmacist patient also got Tussionex back in April and this one is thicker like a caramel color.  Let me know if I need to call the patient again.   Thanks,  -Joseline

## 2015-04-11 ENCOUNTER — Telehealth: Payer: Self-pay | Admitting: Physician Assistant

## 2015-04-11 ENCOUNTER — Other Ambulatory Visit: Payer: Self-pay | Admitting: Physician Assistant

## 2015-04-11 DIAGNOSIS — R059 Cough, unspecified: Secondary | ICD-10-CM

## 2015-04-11 DIAGNOSIS — R05 Cough: Secondary | ICD-10-CM

## 2015-04-11 MED ORDER — HYDROCOD POLST-CPM POLST ER 10-8 MG/5ML PO SUER: 5.0000 mL | Freq: Two times a day (BID) | ORAL | Status: DC | PRN

## 2015-04-11 NOTE — Telephone Encounter (Signed)
Please review-aa 

## 2015-04-11 NOTE — Telephone Encounter (Signed)
Pt advised-aa 

## 2015-04-11 NOTE — Telephone Encounter (Signed)
Medication filled and printed.  I will get once I get to the office to sign it.  Please inform patient Rx will be available upfront for pick up after 1:30 this afternoon when I arrive.  Thanks!

## 2015-04-11 NOTE — Telephone Encounter (Signed)
Pt states she seen you last week and still has a cough.  Pt is requesting a Rx to help with the cough.  [facility].  TO#[phone]

## 2015-05-20 ENCOUNTER — Other Ambulatory Visit: Payer: Self-pay | Admitting: Family Medicine

## 2015-05-20 DIAGNOSIS — M1710 Unilateral primary osteoarthritis, unspecified knee: Secondary | ICD-10-CM

## 2015-07-12 ENCOUNTER — Encounter: Payer: Self-pay | Admitting: Physician Assistant

## 2015-07-12 ENCOUNTER — Ambulatory Visit (INDEPENDENT_AMBULATORY_CARE_PROVIDER_SITE_OTHER): Payer: BLUE CROSS/BLUE SHIELD | Admitting: Physician Assistant

## 2015-07-12 VITALS — BP 114/70 | HR 85 | Temp 98.0°F | Resp 16 | Wt 206.8 lb

## 2015-07-12 DIAGNOSIS — J309 Allergic rhinitis, unspecified: Secondary | ICD-10-CM | POA: Diagnosis not present

## 2015-07-12 DIAGNOSIS — R05 Cough: Secondary | ICD-10-CM | POA: Diagnosis not present

## 2015-07-12 DIAGNOSIS — M25561 Pain in right knee: Secondary | ICD-10-CM | POA: Diagnosis not present

## 2015-07-12 DIAGNOSIS — J0140 Acute pansinusitis, unspecified: Secondary | ICD-10-CM | POA: Diagnosis not present

## 2015-07-12 DIAGNOSIS — B379 Candidiasis, unspecified: Secondary | ICD-10-CM

## 2015-07-12 DIAGNOSIS — L309 Dermatitis, unspecified: Secondary | ICD-10-CM | POA: Diagnosis not present

## 2015-07-12 DIAGNOSIS — T3695XA Adverse effect of unspecified systemic antibiotic, initial encounter: Secondary | ICD-10-CM

## 2015-07-12 DIAGNOSIS — R059 Cough, unspecified: Secondary | ICD-10-CM

## 2015-07-12 MED ORDER — TAPENTADOL HCL 50 MG PO TABS: 50.0000 mg | ORAL_TABLET | Freq: Four times a day (QID) | ORAL | Status: DC | PRN

## 2015-07-12 MED ORDER — HYDROCOD POLST-CPM POLST ER 10-8 MG/5ML PO SUER: 5.0000 mL | Freq: Two times a day (BID) | ORAL | Status: DC | PRN

## 2015-07-12 MED ORDER — AZITHROMYCIN 250 MG PO TABS: ORAL_TABLET | ORAL | Status: DC

## 2015-07-12 MED ORDER — FLUTICASONE PROPIONATE 50 MCG/ACT NA SUSP: 2.0000 | Freq: Every day | NASAL | Status: DC

## 2015-07-12 MED ORDER — FLUCONAZOLE 150 MG PO TABS: 150.0000 mg | ORAL_TABLET | Freq: Once | ORAL | Status: DC

## 2015-07-12 MED ORDER — TRIAMCINOLONE ACETONIDE 0.1 % EX CREA: 1.0000 "application " | TOPICAL_CREAM | Freq: Two times a day (BID) | CUTANEOUS | Status: DC

## 2015-07-12 NOTE — Progress Notes (Signed)
[name] Female    DOB: 1955/09/05   60 y.o.   MRN: [id number] Visit Date: 07/12/2015  Today's Provider: Mar Daring, PA-C   Chief Complaint  Patient presents with  . URI   Subjective:    URI  This is a new problem. The current episode started yesterday. The problem has been gradually worsening. There has been no fever. Associated symptoms include congestion, coughing, headaches, rhinorrhea, sinus pain and sneezing. Pertinent negatives include no abdominal pain, chest pain, nausea, vomiting or wheezing. She has tried decongestant for the symptoms. The treatment provided no relief.      Allergies  Allergen Reactions  . Oxycodone-Acetaminophen Itching  . Tramadol Itching  . Meloxicam Rash   Previous Medications   ASPIRIN EC 81 MG TABLET    Take 81 mg by mouth.   CETIRIZINE (ZYRTEC) 10 MG TABLET    TAKE 1 TABLET (10 MG TOTAL) BY MOUTH DAILY.   CHLORPHENIRAMINE-HYDROCODONE (TUSSIONEX PENNKINETIC ER) 10-8 MG/5ML SUER    Take 5 mLs by mouth every 12 (twelve) hours as needed for cough.   FLUTICASONE (FLONASE) 50 MCG/ACT NASAL SPRAY    2 sprays.   HYDROCHLOROTHIAZIDE (MICROZIDE) 12.5 MG CAPSULE    Take 1-2 a day   IBUPROFEN (ADVIL,MOTRIN) 800 MG TABLET    TAKE 1 TABLET BY MOUTH EVERY EVENING   KLOR-CON 10 10 MEQ TABLET    Reported on 07/12/2015   LORATADINE (CLARITIN) 10 MG TABLET    Take 10 mg by mouth daily.   MONTELUKAST (SINGULAIR) 10 MG TABLET    Take 1 tablet (10 mg total) by mouth daily.   TAPENTADOL (NUCYNTA) 50 MG TABS TABLET    Take by mouth.   TRIAMCINOLONE (KENALOG) 0.025 % CREAM        Review of Systems  Constitutional: Negative.   HENT: Positive for congestion, postnasal drip, rhinorrhea and sneezing.   Respiratory: Positive for cough. Negative for chest tightness, shortness of breath and wheezing.   Cardiovascular: Negative for chest pain.  Gastrointestinal: Negative for nausea, vomiting and abdominal pain.  Neurological: Positive for  headaches. Negative for dizziness.    Social History  Substance Use Topics  . Smoking status: Current Every Day Smoker -- 0.50 packs/day for 40 years    Types: Cigarettes  . Smokeless tobacco: Never Used  . Alcohol Use: 7.2 oz/week    12 Cans of beer per week     Comment: 05/06/2012 "3 cans of beer @ a time; more than that sometimes on weekends when I'm not working"   Objective:   BP 114/70 mmHg  Pulse 85  Temp(Src) 98 F (36.7 C) (Oral)  Resp 16  Wt 206 lb 12.8 oz (93.804 kg)  SpO2 98%  Physical Exam  Constitutional: She appears well-developed and well-nourished. No distress.  HENT:  Head: Normocephalic and atraumatic.  Right Ear: Hearing, external ear and ear canal normal. A middle ear effusion is present.  Left Ear: Hearing, external ear and ear canal normal. A middle ear effusion is present.  Nose: Mucosal edema and rhinorrhea present. Right sinus exhibits maxillary sinus tenderness and frontal sinus tenderness. Left sinus exhibits maxillary sinus tenderness and frontal sinus tenderness.  Mouth/Throat: Uvula is midline, oropharynx is clear and moist and mucous membranes are normal. No oropharyngeal exudate, posterior oropharyngeal edema or posterior oropharyngeal erythema.  Eyes: Conjunctivae are normal. Pupils are equal, round, and reactive to light. Right eye exhibits no discharge. Left eye exhibits no discharge. No  scleral icterus.  Neck: Normal range of motion. Neck supple. No tracheal deviation present. No thyromegaly present.  Cardiovascular: Normal rate, regular rhythm and normal heart sounds.  Exam reveals no gallop and no friction rub.   No murmur heard. Pulmonary/Chest: Effort normal and breath sounds normal. No stridor. No respiratory distress. She has no wheezes. She has no rales.  Lymphadenopathy:    She has no cervical adenopathy.  Skin: Skin is warm and dry. She is not diaphoretic.  Vitals reviewed.       Assessment & Plan:     1. Cough Worsening cough  that is affecting her sleep. I will give Tussionex as below. She is to make sure to stay well-hydrated and get plenty of rest. She is to call the office if symptoms don't improve. - chlorpheniramine-HYDROcodone (TUSSIONEX PENNKINETIC ER) 10-8 MG/5ML SUER; Take 5 mLs by mouth every 12 (twelve) hours as needed for cough.  Dispense: 240 mL; Refill: 0 - fluticasone (FLONASE) 50 MCG/ACT nasal spray; Place 2 sprays into both nostrils daily.  Dispense: 16 g; Refill: 11  2. Eczema Currently stable. Diagnosis was pulled to refill medication as below. Continue current medical treatment plan. - triamcinolone cream (KENALOG) 0.1 %; Apply 1 application topically 2 (two) times daily.  Dispense: 80 g; Refill: 3  3. Right knee pain Currently stable with Nucynta. Diagnosis was pulled to refill medication. She is to continue her current medical treatment plan. - tapentadol (NUCYNTA) 50 MG TABS tablet; Take 1 tablet (50 mg total) by mouth every 6 (six) hours as needed.  Dispense: 80 tablet; Refill: 0  4. Antibiotic-induced yeast infection She gets yeast infections with antibiotic use. I will give her Diflucan to have on hand in case she needs it with current antibiotic treatment for sinusitis. - fluconazole (DIFLUCAN) 150 MG tablet; Take 1 tablet (150 mg total) by mouth once.  Dispense: 1 tablet; Refill: 0  5. Acute pansinusitis, recurrence not specified Worsening symptoms that have failed over-the-counter treatment. I will give a Z-Pak as below. She may use Mucinex DM for congestion. She needs to make sure to stay well-hydrated and get plenty of rest. She is to call the office if symptoms fail to improve or worsen. - azithromycin (ZITHROMAX) 250 MG tablet; Two tablets day one, then one tablet daily next 4 days.  Dispense: 6 tablet; Refill: 0  6. Allergic rhinitis, unspecified allergic rhinitis type Stable. Diagnosis was pulled to refill Flonase as below. He is to continue current medical treatment plan. -  fluticasone (FLONASE) 50 MCG/ACT nasal spray; Place 2 sprays into both nostrils daily.  Dispense: 16 g; Refill: [place], PA-C  [facility]

## 2015-07-25 ENCOUNTER — Telehealth: Payer: Self-pay | Admitting: Physician Assistant

## 2015-07-25 DIAGNOSIS — J0140 Acute pansinusitis, unspecified: Secondary | ICD-10-CM

## 2015-07-25 DIAGNOSIS — R059 Cough, unspecified: Secondary | ICD-10-CM

## 2015-07-25 DIAGNOSIS — R05 Cough: Secondary | ICD-10-CM

## 2015-07-25 NOTE — Telephone Encounter (Signed)
Pt states she was seen last week and is requesting a Rx to help sinus congestion and cough.  [facility].  [id number]:[phone]

## 2015-07-26 ENCOUNTER — Telehealth: Payer: Self-pay | Admitting: Physician Assistant

## 2015-07-26 MED ORDER — PREDNISONE 10 MG PO TABS: ORAL_TABLET | ORAL | Status: DC

## 2015-07-26 MED ORDER — AZITHROMYCIN 250 MG PO TABS: ORAL_TABLET | ORAL | Status: DC

## 2015-07-26 NOTE — Telephone Encounter (Signed)
Sent in another zpak and prednisone. She needs to be seen if no improvement.  Thanks.

## 2015-07-26 NOTE — Telephone Encounter (Signed)
Patient informed that another round of Zpak and prednisone have been sent to the pharmacy and if no improvement patient will need to be seen.  Thanks,  -Deneice Wack

## 2015-07-26 NOTE — Telephone Encounter (Signed)
Not available.  Thanks,  -Navreet Bolda 

## 2015-08-31 ENCOUNTER — Encounter: Payer: Self-pay | Admitting: Physician Assistant

## 2015-08-31 ENCOUNTER — Ambulatory Visit (INDEPENDENT_AMBULATORY_CARE_PROVIDER_SITE_OTHER): Payer: BLUE CROSS/BLUE SHIELD | Admitting: Physician Assistant

## 2015-08-31 VITALS — BP 90/50 | HR 80 | Temp 97.8°F | Resp 16 | Wt 204.8 lb

## 2015-08-31 DIAGNOSIS — M1710 Unilateral primary osteoarthritis, unspecified knee: Secondary | ICD-10-CM

## 2015-08-31 DIAGNOSIS — R05 Cough: Secondary | ICD-10-CM | POA: Diagnosis not present

## 2015-08-31 DIAGNOSIS — R600 Localized edema: Secondary | ICD-10-CM | POA: Diagnosis not present

## 2015-08-31 DIAGNOSIS — J302 Other seasonal allergic rhinitis: Secondary | ICD-10-CM

## 2015-08-31 DIAGNOSIS — R059 Cough, unspecified: Secondary | ICD-10-CM

## 2015-08-31 MED ORDER — MONTELUKAST SODIUM 10 MG PO TABS: 10.0000 mg | ORAL_TABLET | Freq: Every day | ORAL | Status: DC

## 2015-08-31 MED ORDER — PREDNISONE 10 MG PO TABS: ORAL_TABLET | ORAL | Status: DC

## 2015-08-31 MED ORDER — LORATADINE 10 MG PO TABS: 10.0000 mg | ORAL_TABLET | Freq: Every day | ORAL | Status: DC

## 2015-08-31 MED ORDER — HYDROCOD POLST-CPM POLST ER 10-8 MG/5ML PO SUER: 5.0000 mL | Freq: Two times a day (BID) | ORAL | Status: DC | PRN

## 2015-08-31 MED ORDER — IBUPROFEN 800 MG PO TABS
800.0000 mg | ORAL_TABLET | Freq: Every evening | ORAL | Status: DC
Start: 2015-08-31 — End: 2016-01-29

## 2015-08-31 MED ORDER — HYDROCHLOROTHIAZIDE 12.5 MG PO CAPS: ORAL_CAPSULE | ORAL | Status: DC

## 2015-08-31 NOTE — Patient Instructions (Signed)

## 2015-08-31 NOTE — Progress Notes (Signed)
Subjective:     Patient ID: Cathy Townsend, female   DOB: 06/09/1956, 60 y.o.   MRN: [id number]  Cough This is a new problem. The current episode started today. The problem has been unchanged. The problem occurs constantly. The cough is productive of sputum. Associated symptoms include headaches, nasal congestion, postnasal drip and rhinorrhea. Pertinent negatives include no chest pain, chills, ear congestion, ear pain, fever, heartburn, hemoptysis, myalgias, rash, sore throat, shortness of breath, sweats, weight loss or wheezing. Nothing aggravates the symptoms. Risk factors for lung disease include smoking/tobacco exposure. She has tried nothing for the symptoms.     Review of Systems  Constitutional: Negative for fever, chills and weight loss.  HENT: Positive for congestion, postnasal drip, rhinorrhea and sinus pressure. Negative for ear pain and sore throat.   Respiratory: Positive for cough. Negative for hemoptysis, chest tightness, shortness of breath and wheezing.   Cardiovascular: Negative for chest pain, palpitations and leg swelling.  Gastrointestinal: Negative for heartburn, nausea, vomiting and abdominal pain.  Musculoskeletal: Negative for myalgias.  Skin: Negative for rash.  Neurological: Positive for headaches. Negative for dizziness.   Filed Vitals:   08/31/15 1502  BP: 90/50  Pulse: 80  Temp: 97.8 F (36.6 C)  Resp: 16       Objective:   Physical Exam  Constitutional: She appears well-developed and well-nourished. No distress.  HENT:  Head: Normocephalic and atraumatic.  Right Ear: Hearing, tympanic membrane, external ear and ear canal normal.  Left Ear: Hearing, tympanic membrane, external ear and ear canal normal.  Nose: Mucosal edema and rhinorrhea present. Right sinus exhibits maxillary sinus tenderness. Right sinus exhibits no frontal sinus tenderness. Left sinus exhibits maxillary sinus tenderness. Left sinus exhibits no frontal sinus tenderness.   Mouth/Throat: Uvula is midline, oropharynx is clear and moist and mucous membranes are normal. No oropharyngeal exudate, posterior oropharyngeal edema or posterior oropharyngeal erythema.  Eyes: Conjunctivae are normal. Pupils are equal, round, and reactive to light. Right eye exhibits no discharge. Left eye exhibits no discharge. No scleral icterus.  Neck: Normal range of motion. Neck supple. No tracheal deviation present. No thyromegaly present.  Cardiovascular: Normal rate and regular rhythm.  Exam reveals no gallop and no friction rub.   Murmur heard.  Systolic murmur is present with a grade of 2/6  Pulmonary/Chest: Effort normal and breath sounds normal. No stridor. No respiratory distress. She has no wheezes. She has no rales.  Lymphadenopathy:    She has no cervical adenopathy.  Skin: Skin is warm and dry. She is not diaphoretic.  Vitals reviewed.      Assessment:     1. Cough   2. Other seasonal allergic rhinitis   3. Primary osteoarthritis of knee, unspecified laterality   4. Bilateral edema of lower extremity        Plan:     1. Cough Most likely secondary to PND from allergic rhinitis.Will give cough syrup as below with prednisone as this has worked well for her in the past. Will also refill loratadine as she says she has not taken it for a while because she ran out.  She also needs a refill on her singulair, which was given as below. Also advised her to start using her flonase again. She is to call if symptoms fail to improve or worsen. - chlorpheniramine-HYDROcodone (TUSSIONEX PENNKINETIC ER) 10-8 MG/5ML SUER; Take 5 mLs by mouth every 12 (twelve) hours as needed for cough.  Dispense: 240 mL; Refill: 0 - predniSONE (DELTASONE) 10  MG tablet; Take 6 tabs PO on day 1&2, 5 tabs PO on day 3&4, 4 tabs PO on day 5&6, 3 tabs PO on day 7&8, 2 tabs PO on day 9&10, 1 tab PO on day 11&12.  Dispense: 42 tablet; Refill: 0  2. Other seasonal allergic rhinitis See above medical treatment  plan. - loratadine (CLARITIN) 10 MG tablet; Take 1 tablet (10 mg total) by mouth daily. Reported on 08/31/2015  Dispense: 30 tablet; Refill: 12 - montelukast (SINGULAIR) 10 MG tablet; Take 1 tablet (10 mg total) by mouth daily.  Dispense: 30 tablet; Refill: 11 - predniSONE (DELTASONE) 10 MG tablet; Take 6 tabs PO on day 1&2, 5 tabs PO on day 3&4, 4 tabs PO on day 5&6, 3 tabs PO on day 7&8, 2 tabs PO on day 9&10, 1 tab PO on day 11&12.  Dispense: 42 tablet; Refill: 0  3. Primary osteoarthritis of knee, unspecified laterality Stable. Diagnosis pulled to refill medication as below. Continue current medical treatment plan. - ibuprofen (ADVIL,MOTRIN) 800 MG tablet; Take 1 tablet (800 mg total) by mouth every evening.  Dispense: 30 tablet; Refill: 3  4. Bilateral edema of lower extremity Stable. Diagnosis pulled to refill medication as below. Continue current medical treatment plan. - hydrochlorothiazide (MICROZIDE) 12.5 MG capsule; Take 1-2 a day  Dispense: 60 capsule; Refill: 6

## 2015-09-05 ENCOUNTER — Other Ambulatory Visit: Payer: Self-pay | Admitting: Physician Assistant

## 2015-09-05 DIAGNOSIS — M1710 Unilateral primary osteoarthritis, unspecified knee: Secondary | ICD-10-CM

## 2015-09-05 MED ORDER — TAPENTADOL HCL 50 MG PO TABS: 50.0000 mg | ORAL_TABLET | Freq: Four times a day (QID) | ORAL | Status: DC | PRN

## 2015-09-05 NOTE — Telephone Encounter (Signed)
Patient is not available. Jennifer refilled the [employer] because she was able to see the reason the patient needed it, but she will not prescribed Tussionex. She can send a non-narcotic medication to help with cough if needed. I will call later   Thanks,  -Forney Kleinpeter

## 2015-09-05 NOTE — Telephone Encounter (Signed)
Please advise. Thanks.  

## 2015-09-05 NOTE — Telephone Encounter (Signed)
1) I gave her a 24 day supply of tussionex. If she has went through that in 5 days she is taking way too much and thus I cannot refill.  I can send in another non-narcotic medication to help with cough if needed.  2) I do not see her being on Nucynta nor do I remember [place] discussing this.  I have never prescribed her Nucynta.  Who has she gotten that from in the past?

## 2015-09-05 NOTE — Telephone Encounter (Signed)
Pt contacted office for refill request on the following medications: chlorpheniramine-HYDROcodone (TUSSIONEX PENNKINETIC ER) 10-8 MG/5ML SUER  Pt stated that  Nucynta 50 mg was supposed to be sent to [place] last week when she was in for an OV. Pt would like to pick up the cough RX and would like the other sent to [facility]. Please advise. Thanks TNP

## 2015-09-06 NOTE — Telephone Encounter (Signed)
Patient came by the office to pick up [place]. Patient advised as directed below. Patient states she still has some medication left at home, but she is still coughing.

## 2015-10-31 ENCOUNTER — Other Ambulatory Visit: Payer: Self-pay | Admitting: Physician Assistant

## 2015-10-31 DIAGNOSIS — M1710 Unilateral primary osteoarthritis, unspecified knee: Secondary | ICD-10-CM

## 2015-10-31 MED ORDER — TAPENTADOL HCL 50 MG PO TABS: 50.0000 mg | ORAL_TABLET | Freq: Four times a day (QID) | ORAL | Status: DC | PRN

## 2015-10-31 NOTE — Telephone Encounter (Signed)
Please advise 

## 2015-10-31 NOTE — Telephone Encounter (Signed)
Pt needs refill tapentadol (NUCYNTA) 50 MG TABS tablet   Please call when ready for pick up  Marden Noble, teri

## 2015-10-31 NOTE — Telephone Encounter (Signed)
Please review. Thanks!  

## 2015-12-12 ENCOUNTER — Ambulatory Visit (INDEPENDENT_AMBULATORY_CARE_PROVIDER_SITE_OTHER): Payer: BLUE CROSS/BLUE SHIELD | Admitting: Family Medicine

## 2015-12-12 ENCOUNTER — Encounter: Payer: Self-pay | Admitting: Family Medicine

## 2015-12-12 VITALS — BP 106/60 | HR 73 | Temp 98.2°F | Resp 16 | Wt 208.0 lb

## 2015-12-12 DIAGNOSIS — J069 Acute upper respiratory infection, unspecified: Secondary | ICD-10-CM | POA: Diagnosis not present

## 2015-12-12 MED ORDER — HYDROCOD POLST-CPM POLST ER 10-8 MG/5ML PO SUER: 5.0000 mL | Freq: Two times a day (BID) | ORAL | Status: DC | PRN

## 2015-12-12 NOTE — Patient Instructions (Signed)
May try Delsym over the counter for cough. Continue allergy medication.

## 2015-12-12 NOTE — Progress Notes (Signed)
Subjective:     Patient ID: Cathy Townsend, female   DOB: 04/22/1956, 60 y.o.   MRN: [id number]  HPI  Chief Complaint  Patient presents with  . Cough    x 3 days  States she has had PND with cough productive of thin yellowish sputum. + chills but no fever, sinus congestion or shortness of breath reported. Reports compliance with allergy medication. Continues to smoke in the evening.   Review of Systems     Objective:   Physical Exam  Constitutional: She appears well-developed and well-nourished. No distress.  Ears: T.M's intact without inflammation Throat: no tonsillar enlargement or exudate Neck: right anterior cervical tenderness without significant adenopathy Lungs: clear     Assessment:    1. Upper respiratory infection - chlorpheniramine-HYDROcodone (TUSSIONEX PENNKINETIC ER) 10-8 MG/5ML SUER; Take 5 mLs by mouth every 12 (twelve) hours as needed for cough.  Dispense: 120 mL; Refill: 0      Plan:    Discussed use of Delsym. To call if not improving over the course of the week.

## 2016-01-24 ENCOUNTER — Other Ambulatory Visit: Payer: Self-pay | Admitting: Physician Assistant

## 2016-01-24 ENCOUNTER — Telehealth: Payer: Self-pay | Admitting: Physician Assistant

## 2016-01-24 DIAGNOSIS — M1710 Unilateral primary osteoarthritis, unspecified knee: Secondary | ICD-10-CM

## 2016-01-24 MED ORDER — TAPENTADOL HCL 50 MG PO TABS: 50.0000 mg | ORAL_TABLET | Freq: Four times a day (QID) | ORAL | 0 refills | Status: DC | PRN

## 2016-01-24 NOTE — Telephone Encounter (Signed)
Last refill was 10/31/2015. LOV with you was 08/31/2015. Renaldo Fiddler, CMA

## 2016-01-24 NOTE — Telephone Encounter (Signed)
Pt needs refill generic Nucynta 50mg .  She would like to pick up the written RX here at the office.  Please cal her ar [phone] when ready.  Thank sTeri

## 2016-01-24 NOTE — Telephone Encounter (Signed)
Pt advised rx is ready for PU. Renaldo Fiddler, CMA

## 2016-01-25 NOTE — Telephone Encounter (Signed)
error 

## 2016-01-29 ENCOUNTER — Other Ambulatory Visit: Payer: Self-pay | Admitting: Physician Assistant

## 2016-01-29 DIAGNOSIS — M1710 Unilateral primary osteoarthritis, unspecified knee: Secondary | ICD-10-CM

## 2016-02-01 NOTE — Telephone Encounter (Signed)
error 

## 2016-04-10 ENCOUNTER — Encounter: Payer: Self-pay | Admitting: Physician Assistant

## 2016-04-19 ENCOUNTER — Other Ambulatory Visit: Payer: Self-pay | Admitting: Physician Assistant

## 2016-04-19 DIAGNOSIS — R600 Localized edema: Secondary | ICD-10-CM

## 2016-05-15 ENCOUNTER — Ambulatory Visit (INDEPENDENT_AMBULATORY_CARE_PROVIDER_SITE_OTHER): Payer: BLUE CROSS/BLUE SHIELD | Admitting: Physician Assistant

## 2016-05-15 ENCOUNTER — Encounter: Payer: Self-pay | Admitting: Physician Assistant

## 2016-05-15 VITALS — BP 120/62 | HR 65 | Temp 97.8°F | Resp 16 | Ht 62.0 in | Wt 208.0 lb

## 2016-05-15 DIAGNOSIS — M1710 Unilateral primary osteoarthritis, unspecified knee: Secondary | ICD-10-CM

## 2016-05-15 DIAGNOSIS — B9789 Other viral agents as the cause of diseases classified elsewhere: Secondary | ICD-10-CM | POA: Diagnosis not present

## 2016-05-15 DIAGNOSIS — Z0000 Encounter for general adult medical examination without abnormal findings: Secondary | ICD-10-CM

## 2016-05-15 DIAGNOSIS — Z1211 Encounter for screening for malignant neoplasm of colon: Secondary | ICD-10-CM

## 2016-05-15 DIAGNOSIS — J069 Acute upper respiratory infection, unspecified: Secondary | ICD-10-CM

## 2016-05-15 DIAGNOSIS — Z131 Encounter for screening for diabetes mellitus: Secondary | ICD-10-CM | POA: Diagnosis not present

## 2016-05-15 DIAGNOSIS — E782 Mixed hyperlipidemia: Secondary | ICD-10-CM

## 2016-05-15 DIAGNOSIS — R311 Benign essential microscopic hematuria: Secondary | ICD-10-CM

## 2016-05-15 LAB — POCT URINALYSIS DIPSTICK
Bilirubin, UA: NEGATIVE
Glucose, UA: NEGATIVE
Ketones, UA: NEGATIVE
Leukocytes, UA: NEGATIVE
NITRITE UA: NEGATIVE
PROTEIN UA: NEGATIVE
SPEC GRAV UA: 1.015
UROBILINOGEN UA: 0.2
pH, UA: 6

## 2016-05-15 MED ORDER — TAPENTADOL HCL 50 MG PO TABS
50.0000 mg | ORAL_TABLET | Freq: Four times a day (QID) | ORAL | 0 refills | Status: DC | PRN
Start: 2016-05-15 — End: 2016-07-19

## 2016-05-15 MED ORDER — HYDROCOD POLST-CPM POLST ER 10-8 MG/5ML PO SUER: 5.0000 mL | Freq: Two times a day (BID) | ORAL | 0 refills | Status: DC | PRN

## 2016-05-15 MED ORDER — IBUPROFEN 800 MG PO TABS: 800.0000 mg | ORAL_TABLET | Freq: Every evening | ORAL | 5 refills | Status: DC

## 2016-05-15 NOTE — Patient Instructions (Signed)

## 2016-05-15 NOTE — Progress Notes (Signed)
[name], Female    DOB: 07/19/1955, 60 y.o.   MRN: [id number] Visit Date: 05/15/2016  Today's Provider: Mar Daring, PA-C   Chief Complaint  Patient presents with  . Annual Exam   Subjective:    Annual physical exam Cathy Townsend is a 60 y.o. female who presents today for health maintenance and complete physical. She feels well. She reports exercising daily. She reports she is sleeping well.  04/06/14 CPE 03/13/12 Pap-neg; HPV-neg 04/10/16 Mammogram-BI-RADS 0 06/13/04 Colonoscopy -----------------------------------------------------------------  Patient c/o of cough. Patient reports that cough is worse at night. Patient reports yellow sputum. Patient denies fever or sore throat.  Review of Systems  Constitutional: Negative.   HENT: Positive for congestion and sneezing.   Eyes: Negative.   Respiratory: Positive for cough.   Cardiovascular: Negative.   Gastrointestinal: Negative.   Endocrine: Negative.   Genitourinary: Negative.   Musculoskeletal: Negative.   Skin: Negative.   Allergic/Immunologic: Negative.   Neurological: Negative.   Hematological: Negative.   Psychiatric/Behavioral: Negative.     Social History      She  reports that she has been smoking Cigarettes.  She has a 20.00 pack-year smoking history. She has never used smokeless tobacco. She reports that she drinks about 7.2 oz of alcohol per week . She reports that she does not use drugs.       Social History   Social History  . Marital status: Married    Spouse name: N/A  . Number of children: 2  . Years of education: N/A   Social History Main Topics  . Smoking status: Current Every Day Smoker    Packs/day: 0.50    Years: 40.00    Types: Cigarettes  . Smokeless tobacco: Never Used  . Alcohol use 7.2 oz/week    12 Cans of beer per week     Comment: 05/06/2012 "3 cans of beer @ a time; more than that sometimes on weekends when I'm not working"  . Drug use: No    . Sexual activity: No   Other Topics Concern  . None   Social History Narrative  . None    Past Medical History:  Diagnosis Date  . Allergy   . Arthritis    "think I have it in both knees" (05/06/2012)  . Hypertension   . PONV (postoperative nausea and vomiting)      Patient Active Problem List   Diagnosis Date Noted  . Primary osteoarthritis of knee 02/10/2015  . Allergic rhinitis 02/08/2015  . Edema 02/08/2015  . Cardiac murmur 01/24/2015  . Failed total knee, right ([place]) 05/07/2012  . Avitaminosis D 11/09/2009  . Adiposity 02/17/2008  . Tobacco use 02/17/2008  . Hypercholesterolemia without hypertriglyceridemia 05/28/2002  . 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 DILATION AND CURETTAGE OF UTERUS    . JOINT REPLACEMENT     right knee  . REPLACEMENT TOTAL KNEE  ~ 2008   right  . TOTAL KNEE ARTHROPLASTY Left 02/10/2015   Procedure: TOTAL KNEE ARTHROPLASTY;  Surgeon: Hessie Knows, MD;  Location: [facility];  Service: Orthopedics;  Laterality: Left;  . TOTAL KNEE REVISION  05/05/2012   Procedure: TOTAL KNEE REVISION;  Surgeon: Kerin Salen, MD;  Location: [place];  Service: Orthopedics;  Laterality: Right;  . TOTAL THYROIDECTOMY  1990's  . TUBAL LIGATION      Family History  Family Status  Relation Status  . Mother Alive  . Brother Deceased at age 58  . Father Deceased at age 27  . Sister Deceased at age 88  . Brother Deceased at age 21   gunshot wound  . Daughter Alive  . Son Alive  . Sister Alive  . Sister Alive  . Sister Alive        Her family history includes CAD in her mother; Heart attack in her brother; Heart disease in her mother.     Allergies  Allergen Reactions  . Oxycodone-Acetaminophen Itching  . Tramadol Itching  . Meloxicam Rash     Current Outpatient Prescriptions:  .  aspirin EC 81 MG tablet, Take 81 mg by mouth., Disp: , Rfl:  .  fluticasone (FLONASE) 50 MCG/ACT nasal  spray, Place 2 sprays into both nostrils daily., Disp: 16 g, Rfl: 11 .  hydrochlorothiazide (MICROZIDE) 12.5 MG capsule, TAKE 1-2 CAPSULES BY MOUTH ONCE DAILY, Disp: 60 capsule, Rfl: 6 .  ibuprofen (ADVIL,MOTRIN) 800 MG tablet, TAKE 1 TABLET (800 MG TOTAL) BY MOUTH EVERY EVENING., Disp: 30 tablet, Rfl: 5 .  loratadine (CLARITIN) 10 MG tablet, Take 1 tablet (10 mg total) by mouth daily. Reported on 08/31/2015, Disp: 30 tablet, Rfl: 12 .  montelukast (SINGULAIR) 10 MG tablet, Take 1 tablet (10 mg total) by mouth daily., Disp: 30 tablet, Rfl: 11 .  tapentadol (NUCYNTA) 50 MG tablet, Take 1 tablet (50 mg total) by mouth every 6 (six) hours as needed for severe pain., Disp: 80 tablet, Rfl: 0 .  chlorpheniramine-HYDROcodone (TUSSIONEX PENNKINETIC ER) 10-8 MG/5ML SUER, Take 5 mLs by mouth every 12 (twelve) hours as needed for cough. (Patient not taking: Reported on 05/15/2016), Disp: 120 mL, Rfl: 0 .  KLOR-CON 10 10 MEQ tablet, Reported on 07/12/2015, Disp: , Rfl: 0 .  triamcinolone cream (KENALOG) 0.1 %, APPLY 1 APPLICATION TOPICALLY 2 (TWO) TIMES DAILY., Disp: , Rfl: 3   Patient Care Team: Mar Daring, PA-C as PCP - General (Family Medicine)      Objective:   Vitals: BP 120/62 (BP Location: Right Arm, Patient Position: Sitting, Cuff Size: Large)   Pulse 65   Temp 97.8 F (36.6 C) (Oral)   Resp 16   Ht 5\' 2"  (1.575 m)   Wt 208 lb (94.3 kg)   SpO2 99%   BMI 38.04 kg/m    Physical Exam  Constitutional: She is oriented to person, place, and time. She appears well-developed and well-nourished. No distress.  HENT:  Head: Normocephalic and atraumatic.  Right Ear: External ear normal.  Left Ear: External ear normal.  Nose: Nose normal.  Mouth/Throat: Oropharynx is clear and moist. No oropharyngeal exudate.  Eyes: Conjunctivae and EOM are normal. Pupils are equal, round, and reactive to light. Right eye exhibits no discharge. Left eye exhibits no discharge. No scleral icterus.  Neck:  Normal range of motion. Neck supple. No JVD present. No tracheal deviation present. No thyromegaly present.  Cardiovascular: Normal rate, regular rhythm, normal heart sounds and intact distal pulses.  Exam reveals no gallop and no friction rub.   No murmur heard. Pulmonary/Chest: Effort normal and breath sounds normal. No respiratory distress. She has no wheezes. She has no rales. She exhibits no tenderness. Right breast exhibits no inverted nipple, no mass, no nipple discharge, no skin change and no tenderness. Left breast exhibits no inverted nipple, no mass, no nipple discharge, no skin change and no tenderness. Breasts are symmetrical.  Abdominal: Soft. Bowel sounds  are normal. She exhibits no distension and no mass. There is no tenderness. There is no rebound and no guarding.  Musculoskeletal: Normal range of motion. She exhibits no edema or tenderness.  Lymphadenopathy:    She has no cervical adenopathy.  Neurological: She is alert and oriented to person, place, and time.  Skin: Skin is warm and dry. No rash noted. She is not diaphoretic.  Psychiatric: She has a normal mood and affect. Her behavior is normal. Judgment and thought content normal.  Vitals reviewed.    Depression Screen PHQ 2/9 Scores 05/15/2016  PHQ - 2 Score 0      Assessment & Plan:     Routine Health Maintenance and Physical Exam  Exercise Activities and Dietary recommendations Goals    None      Immunization History  Administered Date(s) Administered  . Pneumococcal Polysaccharide-23 04/06/2014  . Td 05/28/2002  . Tdap 11/09/2009  . Zoster 12/31/2013    Health Maintenance  Topic Date Due  . HIV Screening  02/11/1969  . PAP SMEAR  02/12/1975  . COLONOSCOPY  02/12/2004  . INFLUENZA VACCINE  03/18/2017 (Originally 01/17/2016)  . MAMMOGRAM  04/03/2018  . TETANUS/TDAP  11/10/2019  . ZOSTAVAX  Completed  . Hepatitis C Screening  Completed     Discussed health benefits of physical activity, and  encouraged her to engage in regular exercise appropriate for her age and condition.    1. Annual physical exam UA with hematuria. Patient is a smoker. I will send for microscopy. Normal physical exam today. Will check labs as below and f/u pending lab results. If labs are stable and WNL she will not need to have these rechecked for one year at her next annual physical exam. She is to call the office in the meantime if she has any acute issue, questions or concerns. - POCT urinalysis dipstick - CBC with Differential - Comprehensive metabolic panel - TSH  2. Colon cancer screening I will refer her to gastroenterology for colonoscopy. Referral placed as below. - Ambulatory referral to Gastroenterology  3. Encounter for screening for diabetes mellitus Will check labs as below and f/u pending results. - Hemoglobin A1c  4. Hypercholesterolemia with hypertriglyceridemia Will check labs as below and f/u pending results. - Lipid panel  5. Benign essential microscopic hematuria Urine microscopy was negative for RBC. - Urine Microscopic  6. Primary osteoarthritis of knee, unspecified laterality Stable. Diagnosis pulled for medication refill. Continue current medical treatment plan. - ibuprofen (ADVIL,MOTRIN) 800 MG tablet; Take 1 tablet (800 mg total) by mouth every evening.  Dispense: 30 tablet; Refill: 5 - tapentadol (NUCYNTA) 50 MG tablet; Take 1 tablet (50 mg total) by mouth every 6 (six) hours as needed for severe pain.  Dispense: 80 tablet; Refill: 0  7. Viral upper respiratory tract infection Most likely viral upper respiratory infection due to recent onset. I will give cough syrup as below for her to use for nighttime cough. She is to use Delsym for daytime cough. Drowsiness precautions were given to patient. Also advised patient to not take Tussionex at the same time she is taking Nucynta. -chlorpheniramine-HYDROcodone (TUSSIONEX PENNKINETIC ER) 10-8 MG/5ML SUER; Take 5 mLs by mouth  every 12 (twelve) hours as needed for cough.  Dispense: 120 mL; Refill: 0  --------------------------------------------------------------------    Mar Daring, PA-C  [facility]

## 2016-05-16 ENCOUNTER — Telehealth: Payer: Self-pay

## 2016-05-16 LAB — CBC WITH DIFFERENTIAL/PLATELET
Basophils Absolute: 0 10*3/uL (ref 0.0–0.2)
Basos: 1 %
EOS (ABSOLUTE): 0.2 10*3/uL (ref 0.0–0.4)
EOS: 5 %
HEMATOCRIT: 36.4 % (ref 34.0–46.6)
Hemoglobin: 12.7 g/dL (ref 11.1–15.9)
Immature Grans (Abs): 0 10*3/uL (ref 0.0–0.1)
Immature Granulocytes: 0 %
LYMPHS ABS: 1.4 10*3/uL (ref 0.7–3.1)
Lymphs: 35 %
MCH: 31.9 pg (ref 26.6–33.0)
MCHC: 34.9 g/dL (ref 31.5–35.7)
MCV: 92 fL (ref 79–97)
MONOS ABS: 0.5 10*3/uL (ref 0.1–0.9)
Monocytes: 12 %
Neutrophils Absolute: 1.9 10*3/uL (ref 1.4–7.0)
Neutrophils: 47 %
Platelets: 316 10*3/uL (ref 150–379)
RBC: 3.98 x10E6/uL (ref 3.77–5.28)
RDW: 13.9 % (ref 12.3–15.4)
WBC: 4 10*3/uL (ref 3.4–10.8)

## 2016-05-16 LAB — TSH: TSH: 0.832 u[IU]/mL (ref 0.450–4.500)

## 2016-05-16 LAB — COMPREHENSIVE METABOLIC PANEL
A/G RATIO: 1.6 (ref 1.2–2.2)
ALK PHOS: 77 IU/L (ref 39–117)
ALT: 12 IU/L (ref 0–32)
AST: 19 IU/L (ref 0–40)
Albumin: 4.4 g/dL (ref 3.6–4.8)
BUN/Creatinine Ratio: 31 — ABNORMAL HIGH (ref 12–28)
BUN: 17 mg/dL (ref 8–27)
Bilirubin Total: 0.5 mg/dL (ref 0.0–1.2)
CO2: 24 mmol/L (ref 18–29)
CREATININE: 0.54 mg/dL — AB (ref 0.57–1.00)
Calcium: 9.4 mg/dL (ref 8.7–10.3)
Chloride: 99 mmol/L (ref 96–106)
GFR calc Af Amer: 117 mL/min/{1.73_m2} (ref >59)
GFR calc non Af Amer: 101 mL/min/{1.73_m2} (ref >59)
GLOBULIN, TOTAL: 2.8 g/dL (ref 1.5–4.5)
Glucose: 90 mg/dL (ref 65–99)
POTASSIUM: 4.2 mmol/L (ref 3.5–5.2)
SODIUM: 140 mmol/L (ref 134–144)
Total Protein: 7.2 g/dL (ref 6.0–8.5)

## 2016-05-16 LAB — HEMOGLOBIN A1C
ESTIMATED AVERAGE GLUCOSE: 103 mg/dL
HEMOGLOBIN A1C: 5.2 % (ref 4.8–5.6)

## 2016-05-16 LAB — URINALYSIS, MICROSCOPIC ONLY: Casts: NONE SEEN /lpf

## 2016-05-16 LAB — LIPID PANEL
Chol/HDL Ratio: 3.5 ratio units (ref 0.0–4.4)
Cholesterol, Total: 218 mg/dL — ABNORMAL HIGH (ref 100–199)
HDL: 62 mg/dL (ref >39)
LDL Calculated: 137 mg/dL — ABNORMAL HIGH (ref 0–99)
Triglycerides: 95 mg/dL (ref 0–149)
VLDL Cholesterol Cal: 19 mg/dL (ref 5–40)

## 2016-05-16 NOTE — Telephone Encounter (Signed)
-----   Message from Mar Daring, PA-C sent at 05/16/2016  8:24 AM EST ----- Labs stable and WNL. Cholesterol still borderline high but HDL (good) cholesterol is also elevated which offers cardioprotection. Continue healthy lifestyle modifications.

## 2016-05-16 NOTE — Telephone Encounter (Signed)
Not available.  Thanks,  -Cathy Townsend 

## 2016-05-18 NOTE — Telephone Encounter (Signed)
[id number] 

## 2016-05-22 NOTE — Telephone Encounter (Signed)
Patient called back and she was advised of lab results.

## 2016-05-22 NOTE — Telephone Encounter (Signed)
No answer on patient home number or cell phone number. We had left messages before for patient to return call.  Closing chart.  Thanks,  -Montzerrat Brunell

## 2016-06-05 ENCOUNTER — Telehealth: Payer: Self-pay

## 2016-06-05 NOTE — Telephone Encounter (Signed)
Tried reaching patient on both home and cell numbers listed in chart. Phone continued to ring, unable to leave message.

## 2016-06-05 NOTE — Telephone Encounter (Signed)
Spoke with patient at this time to schedule colonoscopy 07/13/16 @ [place].

## 2016-06-05 NOTE — Telephone Encounter (Signed)
No answer-Called to let patient know that the application for the parking placard is ready for pick up.   Thanks,  -Gerlene Glassburn

## 2016-06-05 NOTE — Telephone Encounter (Signed)
LMTCB

## 2016-06-05 NOTE — Telephone Encounter (Signed)
Patient called back to say she would call and set up Colonoscopy after she leaves work.

## 2016-06-06 ENCOUNTER — Telehealth: Payer: Self-pay

## 2016-06-06 NOTE — Telephone Encounter (Signed)
Pt advised and she came by to pick up handicap form per [place]

## 2016-06-07 ENCOUNTER — Other Ambulatory Visit: Payer: Self-pay

## 2016-06-21 ENCOUNTER — Other Ambulatory Visit: Payer: Self-pay | Admitting: Physician Assistant

## 2016-06-21 DIAGNOSIS — J302 Other seasonal allergic rhinitis: Secondary | ICD-10-CM

## 2016-06-21 NOTE — Telephone Encounter (Signed)
LOV/CPE- 05/15/2016. Shelva Hetzer Drozdowski, CMA  

## 2016-07-12 ENCOUNTER — Encounter: Payer: Self-pay | Admitting: *Deleted

## 2016-07-13 ENCOUNTER — Encounter: Payer: Self-pay | Admitting: Anesthesiology

## 2016-07-13 ENCOUNTER — Ambulatory Visit: Payer: BLUE CROSS/BLUE SHIELD | Admitting: Anesthesiology

## 2016-07-13 ENCOUNTER — Encounter
Admission: RE | Disposition: A | Discharge status: Home / Self Care | Payer: Self-pay | Source: Ambulatory Visit | Attending: Gastroenterology

## 2016-07-13 ENCOUNTER — Ambulatory Visit
Admission: RE | Admit: 2016-07-13 | Discharge: 2016-07-13 | Disposition: A | Discharge status: Home / Self Care | Payer: BLUE CROSS/BLUE SHIELD | Source: Ambulatory Visit | Attending: Gastroenterology | Admitting: Gastroenterology

## 2016-07-13 DIAGNOSIS — Z1211 Encounter for screening for malignant neoplasm of colon: Secondary | ICD-10-CM | POA: Insufficient documentation

## 2016-07-13 DIAGNOSIS — K640 First degree hemorrhoids: Secondary | ICD-10-CM | POA: Insufficient documentation

## 2016-07-13 HISTORY — PX: COLONOSCOPY WITH PROPOFOL: SHX5780

## 2016-07-13 SURGERY — COLONOSCOPY WITH PROPOFOL
Anesthesia: General

## 2016-07-13 MED ORDER — PROPOFOL 10 MG/ML IV BOLUS
INTRAVENOUS | Status: DC | PRN
  Administered 2016-07-13: 70 mg via INTRAVENOUS

## 2016-07-13 MED ORDER — PHENYLEPHRINE HCL 10 MG/ML IJ SOLN
INTRAMUSCULAR | Status: AC
  Filled 2016-07-13: qty 1

## 2016-07-13 MED ORDER — PROPOFOL 500 MG/50ML IV EMUL
INTRAVENOUS | Status: DC | PRN
  Administered 2016-07-13: 150 ug/kg/min via INTRAVENOUS

## 2016-07-13 MED ORDER — EPHEDRINE 5 MG/ML INJ
INTRAVENOUS | Status: AC
  Filled 2016-07-13: qty 10

## 2016-07-13 MED ORDER — EPHEDRINE SULFATE 50 MG/ML IJ SOLN
INTRAMUSCULAR | Status: DC | PRN
  Administered 2016-07-13 (×2): 10 mg via INTRAVENOUS

## 2016-07-13 MED ORDER — PROPOFOL 500 MG/50ML IV EMUL
INTRAVENOUS | Status: AC
  Filled 2016-07-13: qty 50

## 2016-07-13 MED ORDER — SODIUM CHLORIDE 0.9 % IV SOLN
INTRAVENOUS | Status: DC
  Administered 2016-07-13: 1000 mL via INTRAVENOUS
  Administered 2016-07-13: 09:00:00 via INTRAVENOUS

## 2016-07-13 NOTE — Anesthesia Preprocedure Evaluation (Addendum)
Anesthesia Evaluation  Patient identified by MRN, date of birth, ID band Patient awake    Reviewed: Allergy & Precautions, H&P , NPO status , Patient's Chart, lab work & pertinent test results, reviewed documented beta blocker date and time   History of Anesthesia Complications (+) PONV and history of anesthetic complications  Airway Mallampati: IV  TM Distance: >3 FB Neck ROM: full    Dental  (+) Partial Upper, Partial Lower, Dental Advidsory Given, Missing   Pulmonary neg shortness of breath, neg sleep apnea, neg COPD, neg recent URI, Current Smoker,    Pulmonary exam normal breath sounds clear to auscultation       Cardiovascular Exercise Tolerance: Good hypertension, (-) angina(-) CAD, (-) Past MI, (-) Cardiac Stents and (-) CABG Normal cardiovascular exam(-) dysrhythmias + Valvular Problems/Murmurs  Rhythm:regular Rate:Normal     Neuro/Psych negative neurological ROS  negative psych ROS   GI/Hepatic negative GI ROS, Neg liver ROS,   Endo/Other  neg diabetesMorbid obesity  Renal/GU negative Renal ROS  negative genitourinary   Musculoskeletal   Abdominal   Peds  Hematology negative hematology ROS (+)   Anesthesia Other Findings Past Medical History: No date: Allergy No date: Arthritis     Comment: "think I have it in both knees" (05/06/2012) No date: Hypertension No date: PONV (postoperative nausea and vomiting)   Reproductive/Obstetrics negative OB ROS                             Anesthesia Physical Anesthesia Plan  ASA: III  Anesthesia Plan: General   Post-op Pain Management:    Induction:   Airway Management Planned:   Additional Equipment:   Intra-op Plan:   Post-operative Plan:   Informed Consent: I have reviewed the patients History and Physical, chart, labs and discussed the procedure including the risks, benefits and alternatives for the proposed anesthesia  with the patient or authorized representative who has indicated his/her understanding and acceptance.   Dental Advisory Given  Plan Discussed with: Anesthesiologist, CRNA and Surgeon  Anesthesia Plan Comments:         Anesthesia Quick Evaluation

## 2016-07-13 NOTE — Anesthesia Postprocedure Evaluation (Signed)
Anesthesia Post Note  [name]  Procedure(s) Performed: Procedure(s) (LRB): COLONOSCOPY WITH PROPOFOL (N/A)  Patient location during evaluation: Endoscopy Anesthesia Type: General Level of consciousness: awake and alert Pain management: pain level controlled Vital Signs Assessment: post-procedure vital signs reviewed and stable Respiratory status: spontaneous breathing, nonlabored ventilation, respiratory function stable and patient connected to nasal cannula oxygen Cardiovascular status: blood pressure returned to baseline and stable Postop Assessment: no signs of nausea or vomiting Anesthetic complications: no     Last Vitals:  Vitals:   07/13/16 0925 07/13/16 0935  BP: (!) 97/53 (!) 98/56  Pulse: 73 74  Resp: (!) 23 (!) 25  Temp:      Last Pain:  Vitals:   07/13/16 0905  TempSrc: Tympanic                 Martha Clan

## 2016-07-13 NOTE — Anesthesia Procedure Notes (Signed)
Date/Time: 07/13/2016 8:49 AM Performed by: Nelda Marseille Pre-anesthesia Checklist: Patient identified, Emergency Drugs available, Suction available, Patient being monitored and Timeout performed Oxygen Delivery Method: Nasal cannula

## 2016-07-13 NOTE — H&P (Signed)
Jonathon Bellows MD [address], [place], [place] [address] Phone: [phone] Fax : [phone]  Primary Care Physician:  Mar Daring, PA-C Primary Gastroenterologist:  Dr. Jonathon Bellows   Pre-Procedure History & Physical: HPI:  Cathy Townsend is a 61 y.o. female is here for an colonoscopy.   Past Medical History:  Diagnosis Date  . Allergy   . Arthritis    "think I have it in both knees" (05/06/2012)  . Hypertension   . PONV (postoperative nausea and vomiting)     Past Surgical History:  Procedure Laterality Date  . DILATION AND CURETTAGE OF UTERUS    . JOINT REPLACEMENT     right knee  . REPLACEMENT TOTAL KNEE  ~ 2008   right  . TOTAL KNEE ARTHROPLASTY Left 02/10/2015   Procedure: TOTAL KNEE ARTHROPLASTY;  Surgeon: Hessie Knows, MD;  Location: [facility];  Service: Orthopedics;  Laterality: Left;  . TOTAL KNEE REVISION  05/05/2012   Procedure: TOTAL KNEE REVISION;  Surgeon: Kerin Salen, MD;  Location: [place];  Service: Orthopedics;  Laterality: Right;  . TOTAL THYROIDECTOMY  1990's  . TUBAL LIGATION      Prior to Admission medications   Medication Sig Start Date End Date Taking? Authorizing Provider  aspirin EC 81 MG tablet Take 81 mg by mouth.   Yes Historical Provider, MD  chlorpheniramine-HYDROcodone (TUSSIONEX PENNKINETIC ER) 10-8 MG/5ML SUER Take 5 mLs by mouth every 12 (twelve) hours as needed for cough. 05/15/16  Yes Clearnce Sorrel Burnette, PA-C  fluticasone (FLONASE) 50 MCG/ACT nasal spray Place 2 sprays into both nostrils daily. 07/12/15  Yes Clearnce Sorrel Burnette, PA-C  hydrochlorothiazide (MICROZIDE) 12.5 MG capsule TAKE 1-2 CAPSULES BY MOUTH ONCE DAILY 04/19/16  Yes Clearnce Sorrel Burnette, PA-C  ibuprofen (ADVIL,MOTRIN) 800 MG tablet Take 1 tablet (800 mg total) by mouth every evening. 05/15/16  Yes Clearnce Sorrel Burnette, PA-C  loratadine (CLARITIN) 10 MG tablet TAKE 1 TABLET (10 MG TOTAL) BY MOUTH DAILY. REPORTED ON 08/31/2015 06/21/16  Yes Jennifer M Burnette,  PA-C  montelukast (SINGULAIR) 10 MG tablet Take 1 tablet (10 mg total) by mouth daily. 08/31/15  Yes Clearnce Sorrel Burnette, PA-C  triamcinolone cream (KENALOG) 0.1 % APPLY 1 APPLICATION TOPICALLY 2 (TWO) TIMES DAILY. 03/24/16  Yes Historical Provider, MD  tapentadol (NUCYNTA) 50 MG tablet Take 1 tablet (50 mg total) by mouth every 6 (six) hours as needed for severe pain. Patient not taking: Reported on 07/13/2016 05/15/16   Mar Daring, PA-C    Allergies as of 06/07/2016 - Review Complete 05/15/2016  Allergen Reaction Noted  . Oxycodone-acetaminophen Itching 01/24/2015  . Tramadol Itching 04/04/2015  . Meloxicam Rash 01/24/2015    Family History  Problem Relation Age of Onset  . CAD Mother   . Heart disease Mother   . Heart attack Brother     Social History   Social History  . Marital status: Married    Spouse name: N/A  . Number of children: 2  . Years of education: N/A   Occupational History  . Not on file.   Social History Main Topics  . Smoking status: Current Every Day Smoker    Packs/day: 0.50    Years: 40.00    Types: Cigarettes  . Smokeless tobacco: Never Used  . Alcohol use 7.2 oz/week    12 Cans of beer per week     Comment: 05/06/2012 "3 cans of beer @ a time; more than that sometimes on weekends when I'm  not working"  . Drug use: No  . Sexual activity: No   Other Topics Concern  . Not on file   Social History Narrative  . No narrative on file    Review of Systems: See HPI, otherwise negative ROS  Physical Exam: BP (!) 103/57   Pulse 67   Temp 97.4 F (36.3 C) (Tympanic)   Resp 16   Ht 5\' 2"  (1.575 m)   Wt 216 lb (98 kg)   SpO2 100%   BMI 39.51 kg/m  General:   Alert,  pleasant and cooperative in NAD Head:  Normocephalic and atraumatic. Neck:  Supple; no masses or thyromegaly. Lungs:  Clear throughout to auscultation.    Heart:  Regular rate and rhythm. Abdomen:  Soft, nontender and nondistended. Normal bowel sounds, without  guarding, and without rebound.   Neurologic:  Alert and  oriented x4;  grossly normal neurologically.  Impression/Plan: ADANA PEDRO is here for an colonoscopy to be performed for Screening colonoscopy average risk    Risks, benefits, limitations, and alternatives regarding  colonoscopy have been reviewed with the patient.  Questions have been answered.  All parties agreeable.   Jonathon Bellows, MD  07/13/2016, 8:37 AM

## 2016-07-13 NOTE — Anesthesia Post-op Follow-up Note (Cosign Needed)
Anesthesia QCDR form completed.        

## 2016-07-13 NOTE — Op Note (Addendum)
[facility] Gastroenterology Patient Name: Cathy Townsend Procedure Date: 07/13/2016 8:33 AM MRN: [id number] Account #: [id number] Date of Birth: [birth year] Admit Type: Outpatient Age: 61 Room: [facility] ENDO ROOM 4 Gender: Female Note Status: Finalized Procedure:            Colonoscopy Indications:          Screening for colorectal malignant neoplasm Providers:            Jonathon Bellows MD, MD Referring MD:         Mar Daring (Referring MD) Medicines:            Monitored Anesthesia Care Complications:        No immediate complications. Procedure:            Pre-Anesthesia Assessment:                       - Prior to the procedure, a History and Physical was                        performed, and patient medications, allergies and                        sensitivities were reviewed. The patient's tolerance of                        previous anesthesia was reviewed.                       - The risks and benefits of the procedure and the                        sedation options and risks were discussed with the                        patient. All questions were answered and informed                        consent was obtained.                       - The risks and benefits of the procedure and the                        sedation options and risks were discussed with the                        patient. All questions were answered and informed                        consent was obtained.                       - ASA Grade Assessment: III - A patient with severe                        systemic disease.                       After obtaining informed consent, the colonoscope was  passed under direct vision. Throughout the procedure,                        the patient's blood pressure, pulse, and oxygen                        saturations were monitored continuously. The                        Colonoscope was introduced through the anus and              advanced to the the cecum, identified by the                        appendiceal orifice, IC valve and transillumination.                        The colonoscopy was performed with ease. The patient                        tolerated the procedure well. The quality of the bowel                        preparation was poor. Findings:      Non-bleeding internal hemorrhoids were found during retroflexion. The       hemorrhoids were small and Grade I (internal hemorrhoids that do not       prolapse).      A moderate amount of semi-solid stool was found in the sigmoid colon, in       the descending colon, in the transverse colon, in the ascending colon       and in the cecum, precluding visualization. Impression:           - Preparation of the colon was poor.                       - Non-bleeding internal hemorrhoids.                       - Stool in the sigmoid colon, in the descending colon,                        in the transverse colon, in the ascending colon and in                        the cecum.                       - No specimens collected. Recommendation:       - Discharge patient to home (with escort).                       - Resume previous diet.                       - Continue present medications.                       - Repeat colonoscopy in 6 months because the bowel                        preparation  was suboptimal. Jonathon Bellows, MD Jonathon Bellows MD, MD 07/13/2016 9:03:10 AM This report has been signed electronically. Number of Addenda: 0 Note Initiated On: 07/13/2016 8:33 AM Scope Withdrawal Time: 0 hours 8 minutes 26 seconds  Total Procedure Duration: 0 hours 13 minutes 35 seconds       [facility]

## 2016-07-13 NOTE — Transfer of Care (Signed)
Immediate Anesthesia Transfer of Care Note  [name]  Procedure(s) Performed: Procedure(s): COLONOSCOPY WITH PROPOFOL (N/A)  Patient Location: PACU  Anesthesia Type:General  Level of Consciousness: awake and alert   Airway & Oxygen Therapy: Patient Spontanous Breathing and Patient connected to nasal cannula oxygen  Post-op Assessment: Report given to RN and Post -op Vital signs reviewed and stable  Post vital signs: Reviewed and stable  Last Vitals:  Vitals:   07/13/16 0752 07/13/16 0905  BP: (!) 103/57 (!) 84/51  Pulse: 67 79  Resp: 16 (!) 25  Temp: 36.3 C (!) 35.7 C    Last Pain:  Vitals:   07/13/16 0905  TempSrc: Tympanic         Complications: No apparent anesthesia complications

## 2016-07-16 ENCOUNTER — Encounter: Payer: Self-pay | Admitting: Gastroenterology

## 2016-07-19 ENCOUNTER — Telehealth: Payer: Self-pay | Admitting: Physician Assistant

## 2016-07-19 DIAGNOSIS — M1710 Unilateral primary osteoarthritis, unspecified knee: Secondary | ICD-10-CM

## 2016-07-19 MED ORDER — TAPENTADOL HCL 50 MG PO TABS: 50.0000 mg | ORAL_TABLET | Freq: Four times a day (QID) | ORAL | 0 refills | Status: DC | PRN

## 2016-07-19 NOTE — Telephone Encounter (Signed)
Please Review.  Thanks,  -Joseline 

## 2016-07-19 NOTE — Telephone Encounter (Signed)
Nucynta reordered and printed.

## 2016-07-19 NOTE — Telephone Encounter (Signed)
Pt contacted office for refill request on the following medications: tapentadol (NUCYNTA) 50 MG tablet  Last Rx: 05/15/16 Please advise. Thanks TNP

## 2016-07-24 NOTE — Telephone Encounter (Signed)
Patient pick up prescription.  Thanks,  -Joseline

## 2016-07-25 ENCOUNTER — Encounter: Payer: Self-pay | Admitting: Physician Assistant

## 2016-07-25 ENCOUNTER — Ambulatory Visit (INDEPENDENT_AMBULATORY_CARE_PROVIDER_SITE_OTHER): Payer: BLUE CROSS/BLUE SHIELD | Admitting: Physician Assistant

## 2016-07-25 VITALS — BP 118/60 | HR 83 | Temp 98.8°F | Resp 16 | Wt 216.0 lb

## 2016-07-25 DIAGNOSIS — R059 Cough, unspecified: Secondary | ICD-10-CM

## 2016-07-25 DIAGNOSIS — R05 Cough: Secondary | ICD-10-CM | POA: Diagnosis not present

## 2016-07-25 DIAGNOSIS — J0100 Acute maxillary sinusitis, unspecified: Secondary | ICD-10-CM | POA: Diagnosis not present

## 2016-07-25 MED ORDER — AZITHROMYCIN 250 MG PO TABS: ORAL_TABLET | ORAL | 0 refills | Status: DC

## 2016-07-25 MED ORDER — HYDROCOD POLST-CPM POLST ER 10-8 MG/5ML PO SUER: 5.0000 mL | Freq: Two times a day (BID) | ORAL | 0 refills | Status: DC | PRN

## 2016-07-25 NOTE — Patient Instructions (Signed)

## 2016-07-25 NOTE — Progress Notes (Signed)
[name] Female    DOB: 05-31-1956   61 y.o.   MRN: [id number] Visit Date: 07/25/2016  Today's Provider: Mar Daring, PA-C   Chief Complaint  Patient presents with  . URI   Subjective:    HPI Upper Respiratory Infection: Patient complains of symptoms of a URI. Symptoms include congestion and cough. Onset of symptoms was 3 days ago, gradually worsening since that time. She also c/o coryza, productive cough with  green colored sputum and chills for the past 1 day .  She is drinking plenty of fluids. Evaluation to date: none. Treatment to date: cough suppressants and decongestants.     Allergies  Allergen Reactions  . Oxycodone-Acetaminophen Itching  . Tramadol Itching  . Meloxicam Rash   Patient Active Problem List   Diagnosis Date Noted  . Primary osteoarthritis of knee 02/10/2015  . Allergic rhinitis 02/08/2015  . Edema 02/08/2015  . Cardiac murmur 01/24/2015  . Failed total knee, right ([place]) 05/07/2012  . Avitaminosis D 11/09/2009  . Adiposity 02/17/2008  . Tobacco use 02/17/2008  . Hypercholesterolemia without hypertriglyceridemia 05/28/2002  . Benign neoplasm of uterus 11/10/1997     Current Outpatient Prescriptions:  .  aspirin EC 81 MG tablet, Take 81 mg by mouth., Disp: , Rfl:  .  fluticasone (FLONASE) 50 MCG/ACT nasal spray, Place 2 sprays into both nostrils daily., Disp: 16 g, Rfl: 11 .  hydrochlorothiazide (MICROZIDE) 12.5 MG capsule, TAKE 1-2 CAPSULES BY MOUTH ONCE DAILY, Disp: 60 capsule, Rfl: 6 .  ibuprofen (ADVIL,MOTRIN) 800 MG tablet, Take 1 tablet (800 mg total) by mouth every evening., Disp: 30 tablet, Rfl: 5 .  loratadine (CLARITIN) 10 MG tablet, TAKE 1 TABLET (10 MG TOTAL) BY MOUTH DAILY. REPORTED ON 08/31/2015, Disp: 30 tablet, Rfl: 11 .  tapentadol (NUCYNTA) 50 MG tablet, Take 1 tablet (50 mg total) by mouth every 6 (six) hours as needed for severe pain., Disp: 80 tablet, Rfl: 0  Review of Systems  Constitutional:  Positive for chills.  HENT: Positive for congestion, postnasal drip, rhinorrhea, sinus pain and sinus pressure. Negative for ear pain, sneezing, sore throat, tinnitus and trouble swallowing.   Respiratory: Positive for cough. Negative for chest tightness, shortness of breath and wheezing.   Cardiovascular: Negative for chest pain, palpitations and leg swelling.  Gastrointestinal: Negative for abdominal pain.  Neurological: Negative for dizziness and headaches.    Social History  Substance Use Topics  . Smoking status: Current Every Day Smoker    Packs/day: 0.50    Years: 40.00    Types: Cigarettes  . Smokeless tobacco: Never Used  . Alcohol use 7.2 oz/week    12 Cans of beer per week     Comment: 05/06/2012 "3 cans of beer @ a time; more than that sometimes on weekends when I'm not working"   Objective:   BP 118/60 (BP Location: Left Arm, Patient Position: Sitting, Cuff Size: Large)   Pulse 83   Temp 98.8 F (37.1 C)   Resp 16   Wt 216 lb (98 kg)   SpO2 99%   BMI 39.51 kg/m   Physical Exam  Constitutional: She appears well-developed and well-nourished. No distress.  HENT:  Head: Normocephalic and atraumatic.  Right Ear: Hearing, tympanic membrane, external ear and ear canal normal.  Left Ear: Hearing, tympanic membrane, external ear and ear canal normal.  Nose: Mucosal edema present. No rhinorrhea. Right sinus exhibits maxillary sinus tenderness. Right sinus exhibits no frontal sinus  tenderness. Left sinus exhibits maxillary sinus tenderness. Left sinus exhibits no frontal sinus tenderness.  Mouth/Throat: Uvula is midline, oropharynx is clear and moist and mucous membranes are normal. No oropharyngeal exudate, posterior oropharyngeal edema or posterior oropharyngeal erythema.  Neck: Normal range of motion. Neck supple. No tracheal deviation present. No thyromegaly present.  Cardiovascular: Normal rate and regular rhythm.  Exam reveals no gallop and no friction rub.   Murmur  heard.  Systolic murmur is present with a grade of 3/6  Pulmonary/Chest: Effort normal and breath sounds normal. No stridor. No respiratory distress. She has no wheezes. She has no rales.  Lymphadenopathy:    She has no cervical adenopathy.  Skin: She is not diaphoretic.  Vitals reviewed.     Assessment & Plan:     1. Acute maxillary sinusitis, recurrence not specified Worsening symptoms that have not responded to OTC medications. Will give Zpak as below. Continue allergy medications. Stay well hydrated and get plenty of rest. Call if no symptom improvement or if symptoms worsen. - azithromycin (ZITHROMAX) 250 MG tablet; Take 2 tablets PO on day one, and one tablet PO daily thereafter until completed.  Dispense: 6 tablet; Refill: 0  2. Cough Worsening symptoms that has not responded to OTC medications. Will give Tussionex cough syrup as below for nighttime cough. Drowsiness precautions given to patient. Stay well hydrated. Use delsym, robitussin OR mucinex for daytime cough. - chlorpheniramine-HYDROcodone (TUSSIONEX PENNKINETIC ER) 10-8 MG/5ML SUER; Take 5 mLs by mouth every 12 (twelve) hours as needed for cough.  Dispense: 120 mL; Refill: 0       Mar Daring, PA-C  [facility]

## 2016-08-21 ENCOUNTER — Encounter: Payer: Self-pay | Admitting: Physician Assistant

## 2016-09-03 ENCOUNTER — Other Ambulatory Visit: Payer: Self-pay | Admitting: Physician Assistant

## 2016-09-03 DIAGNOSIS — J302 Other seasonal allergic rhinitis: Secondary | ICD-10-CM

## 2016-10-09 ENCOUNTER — Other Ambulatory Visit: Payer: Self-pay | Admitting: Physician Assistant

## 2016-10-09 DIAGNOSIS — L309 Dermatitis, unspecified: Secondary | ICD-10-CM

## 2016-10-09 NOTE — Telephone Encounter (Signed)
Please review. Thanks!  

## 2016-10-29 ENCOUNTER — Ambulatory Visit (INDEPENDENT_AMBULATORY_CARE_PROVIDER_SITE_OTHER): Payer: Self-pay | Admitting: Physician Assistant

## 2016-10-29 ENCOUNTER — Encounter: Payer: Self-pay | Admitting: Physician Assistant

## 2016-10-29 VITALS — BP 110/80 | HR 78 | Temp 100.0°F | Resp 16 | Wt 208.0 lb

## 2016-10-29 DIAGNOSIS — T3695XA Adverse effect of unspecified systemic antibiotic, initial encounter: Secondary | ICD-10-CM

## 2016-10-29 DIAGNOSIS — J0140 Acute pansinusitis, unspecified: Secondary | ICD-10-CM

## 2016-10-29 DIAGNOSIS — B379 Candidiasis, unspecified: Secondary | ICD-10-CM

## 2016-10-29 MED ORDER — AMOXICILLIN 875 MG PO TABS: 875.0000 mg | ORAL_TABLET | Freq: Two times a day (BID) | ORAL | 0 refills | Status: DC

## 2016-10-29 MED ORDER — FLUCONAZOLE 150 MG PO TABS: 150.0000 mg | ORAL_TABLET | Freq: Once | ORAL | 0 refills | Status: AC

## 2016-10-29 NOTE — Progress Notes (Signed)
[name] Female    DOB: 1955/09/17   61 y.o.   MRN: [id number] Visit Date: 10/29/2016  Today's Provider: Mar Daring, PA-C   Chief Complaint  Patient presents with  . URI   Subjective:    HPI Upper Respiratory Infection: Patient complains of symptoms of a URI, possible sinusitis. Symptoms include congestion and cough. Onset of symptoms was 2 days ago, gradually worsening since that time. She also c/o congestion, facial pain, fever 100 and productive cough with  green colored sputum for the past 1 day .  She is drinking plenty of fluids. Evaluation to date: none. Treatment to date: cough suppressants.     Allergies  Allergen Reactions  . Oxycodone-Acetaminophen Itching  . Tramadol Itching  . Meloxicam Rash     Current Outpatient Prescriptions:  .  aspirin EC 81 MG tablet, Take 81 mg by mouth., Disp: , Rfl:  .  fluticasone (FLONASE) 50 MCG/ACT nasal spray, Place 2 sprays into both nostrils daily., Disp: 16 g, Rfl: 11 .  hydrochlorothiazide (MICROZIDE) 12.5 MG capsule, TAKE 1-2 CAPSULES BY MOUTH ONCE DAILY, Disp: 60 capsule, Rfl: 6 .  ibuprofen (ADVIL,MOTRIN) 800 MG tablet, Take 1 tablet (800 mg total) by mouth every evening., Disp: 30 tablet, Rfl: 5 .  loratadine (CLARITIN) 10 MG tablet, TAKE 1 TABLET (10 MG TOTAL) BY MOUTH DAILY. REPORTED ON 08/31/2015, Disp: 30 tablet, Rfl: 11 .  montelukast (SINGULAIR) 10 MG tablet, TAKE 1 TABLET (10 MG TOTAL) BY MOUTH DAILY., Disp: 30 tablet, Rfl: 5 .  triamcinolone cream (KENALOG) 0.1 %, APPLY 1 APPLICATION TOPICALLY 2 (TWO) TIMES DAILY., Disp: 80 g, Rfl: 3  Review of Systems  Constitutional: Positive for chills and fever.  HENT: Positive for congestion, sinus pain and sinus pressure.   Respiratory: Positive for cough.   Cardiovascular: Negative.   Gastrointestinal: Negative.   Musculoskeletal: Positive for myalgias.  Neurological: Negative.     Social History  Substance Use Topics  . Smoking status:  Current Every Day Smoker    Packs/day: 0.50    Years: 40.00    Types: Cigarettes  . Smokeless tobacco: Never Used  . Alcohol use 7.2 oz/week    12 Cans of beer per week     Comment: 05/06/2012 "3 cans of beer @ a time; more than that sometimes on weekends when I'm not working"   Objective:   BP 110/80 (BP Location: Left Arm, Patient Position: Sitting, Cuff Size: Large)   Pulse 78   Temp 100 F (37.8 C) (Oral)   Resp 16   Wt 208 lb (94.3 kg)   SpO2 99%   BMI 38.04 kg/m  Vitals:   10/29/16 1106  BP: 110/80  Pulse: 78  Resp: 16  Temp: 100 F (37.8 C)  TempSrc: Oral  SpO2: 99%  Weight: 208 lb (94.3 kg)     Physical Exam  Constitutional: She appears well-developed and well-nourished. No distress.  HENT:  Head: Normocephalic and atraumatic.  Right Ear: Hearing, external ear and ear canal normal.  Left Ear: Hearing, external ear and ear canal normal.  Nose: Mucosal edema and rhinorrhea present. Right sinus exhibits maxillary sinus tenderness and frontal sinus tenderness. Left sinus exhibits maxillary sinus tenderness and frontal sinus tenderness.  Mouth/Throat: Uvula is midline, oropharynx is clear and moist and mucous membranes are normal. No oropharyngeal exudate (thick clear mucus), posterior oropharyngeal edema or posterior oropharyngeal erythema.  Cerumen build up but no ear pain or hearing changes  currently.  Neck: Normal range of motion. Neck supple. No tracheal deviation present. No thyromegaly present.  Cardiovascular: Normal rate, regular rhythm and normal heart sounds.  Exam reveals no gallop and no friction rub.   No murmur heard. Pulmonary/Chest: Effort normal and breath sounds normal. No stridor. No respiratory distress. She has no wheezes. She has no rales.  Lymphadenopathy:    She has no cervical adenopathy.  Skin: She is not diaphoretic.  Vitals reviewed.      Assessment & Plan:     1. Acute pansinusitis, recurrence not specified Worsening symptoms  that have not responded to OTC medications. Will give augmentin as below. Continue allergy medications. Stay well hydrated and get plenty of rest. Call if no symptom improvement or if symptoms worsen. - amoxicillin (AMOXIL) 875 MG tablet; Take 1 tablet (875 mg total) by mouth 2 (two) times daily.  Dispense: 20 tablet; Refill: 0  2. Antibiotic-induced yeast infection Patient gets yeast infection with amoxicillin. Will give diflucan as below for when she develops symptoms.  - fluconazole (DIFLUCAN) 150 MG tablet; Take 1 tablet (150 mg total) by mouth once.  Dispense: 1 tablet; Refill: 0       Mar Daring, PA-C  [facility]

## 2016-10-29 NOTE — Patient Instructions (Signed)

## 2016-11-01 ENCOUNTER — Telehealth: Payer: Self-pay | Admitting: Physician Assistant

## 2016-11-01 DIAGNOSIS — R059 Cough, unspecified: Secondary | ICD-10-CM

## 2016-11-01 DIAGNOSIS — R05 Cough: Secondary | ICD-10-CM

## 2016-11-01 MED ORDER — HYDROCOD POLST-CPM POLST ER 10-8 MG/5ML PO SUER: 5.0000 mL | Freq: Two times a day (BID) | ORAL | 0 refills | Status: DC | PRN

## 2016-11-01 NOTE — Telephone Encounter (Signed)
Pt states she seen Jenni on Monday for sinus congestion.  Pt states she now has a cough and is requesting a Rx to help with the cough.  [facility].  KZ#[phone]/[phone]

## 2016-11-01 NOTE — Telephone Encounter (Signed)
Please review. Cathy Townsend, CMA  

## 2016-11-01 NOTE — Telephone Encounter (Signed)
Cough syrup Rx printed

## 2016-11-14 ENCOUNTER — Telehealth: Payer: Self-pay | Admitting: Physician Assistant

## 2016-11-14 DIAGNOSIS — J0140 Acute pansinusitis, unspecified: Secondary | ICD-10-CM

## 2016-11-14 MED ORDER — AMOXICILLIN 875 MG PO TABS: 875.0000 mg | ORAL_TABLET | Freq: Two times a day (BID) | ORAL | 0 refills | Status: DC

## 2016-11-14 MED ORDER — PSEUDOEPH-BROMPHEN-DM 30-2-10 MG/5ML PO SYRP: 5.0000 mL | ORAL_SOLUTION | Freq: Three times a day (TID) | ORAL | 0 refills | Status: DC | PRN

## 2016-11-14 NOTE — Telephone Encounter (Signed)
Patient was seen in the office 10/29/16 due to a URI. She was treated with Augmenting 875mg  BID. Patient still has cough, congestion, but denies any fever. Please advise. Thanks!

## 2016-11-14 NOTE — Telephone Encounter (Signed)
Advised patient

## 2016-11-14 NOTE — Telephone Encounter (Signed)
Augmentin and bromfed cough syrup sent in to [facility]

## 2016-11-14 NOTE — Telephone Encounter (Signed)
Pt states she was seen last week.  Pt is requesting a Rx to help with her sinus congestion and a cough.  [facility].  PC#[phone]

## 2016-11-16 ENCOUNTER — Other Ambulatory Visit: Payer: Self-pay | Admitting: Physician Assistant

## 2016-11-16 DIAGNOSIS — R600 Localized edema: Secondary | ICD-10-CM

## 2016-11-19 ENCOUNTER — Other Ambulatory Visit: Payer: Self-pay | Admitting: Physician Assistant

## 2016-11-19 DIAGNOSIS — M1710 Unilateral primary osteoarthritis, unspecified knee: Secondary | ICD-10-CM

## 2017-02-12 ENCOUNTER — Encounter: Payer: Self-pay | Admitting: Physician Assistant

## 2017-04-12 ENCOUNTER — Other Ambulatory Visit: Payer: Self-pay | Admitting: Physician Assistant

## 2017-04-12 DIAGNOSIS — J302 Other seasonal allergic rhinitis: Secondary | ICD-10-CM

## 2017-05-09 ENCOUNTER — Other Ambulatory Visit: Payer: Self-pay | Admitting: Physician Assistant

## 2017-05-09 DIAGNOSIS — R600 Localized edema: Secondary | ICD-10-CM

## 2017-05-20 ENCOUNTER — Ambulatory Visit: Payer: Self-pay | Admitting: Physician Assistant

## 2017-05-20 ENCOUNTER — Other Ambulatory Visit: Payer: Self-pay

## 2017-05-20 ENCOUNTER — Encounter: Payer: Self-pay | Admitting: Physician Assistant

## 2017-05-20 VITALS — BP 90/60 | HR 62 | Temp 97.8°F | Resp 16 | Ht 62.0 in | Wt 210.0 lb

## 2017-05-20 DIAGNOSIS — E669 Obesity, unspecified: Secondary | ICD-10-CM

## 2017-05-20 DIAGNOSIS — Z124 Encounter for screening for malignant neoplasm of cervix: Secondary | ICD-10-CM

## 2017-05-20 DIAGNOSIS — R011 Cardiac murmur, unspecified: Secondary | ICD-10-CM

## 2017-05-20 DIAGNOSIS — J309 Allergic rhinitis, unspecified: Secondary | ICD-10-CM

## 2017-05-20 DIAGNOSIS — E7800 Pure hypercholesterolemia, unspecified: Secondary | ICD-10-CM

## 2017-05-20 DIAGNOSIS — R059 Cough, unspecified: Secondary | ICD-10-CM

## 2017-05-20 DIAGNOSIS — E559 Vitamin D deficiency, unspecified: Secondary | ICD-10-CM

## 2017-05-20 DIAGNOSIS — Z0000 Encounter for general adult medical examination without abnormal findings: Secondary | ICD-10-CM

## 2017-05-20 DIAGNOSIS — R600 Localized edema: Secondary | ICD-10-CM

## 2017-05-20 DIAGNOSIS — J0140 Acute pansinusitis, unspecified: Secondary | ICD-10-CM

## 2017-05-20 DIAGNOSIS — Z1211 Encounter for screening for malignant neoplasm of colon: Secondary | ICD-10-CM

## 2017-05-20 DIAGNOSIS — Z114 Encounter for screening for human immunodeficiency virus [HIV]: Secondary | ICD-10-CM

## 2017-05-20 DIAGNOSIS — R05 Cough: Secondary | ICD-10-CM

## 2017-05-20 DIAGNOSIS — M1710 Unilateral primary osteoarthritis, unspecified knee: Secondary | ICD-10-CM

## 2017-05-20 DIAGNOSIS — Z23 Encounter for immunization: Secondary | ICD-10-CM

## 2017-05-20 DIAGNOSIS — Z6838 Body mass index (BMI) 38.0-38.9, adult: Secondary | ICD-10-CM

## 2017-05-20 MED ORDER — PSEUDOEPH-BROMPHEN-DM 30-2-10 MG/5ML PO SYRP: 5.0000 mL | ORAL_SOLUTION | Freq: Three times a day (TID) | ORAL | 0 refills | Status: DC | PRN

## 2017-05-20 MED ORDER — AMOXICILLIN-POT CLAVULANATE 875-125 MG PO TABS: 1.0000 | ORAL_TABLET | Freq: Two times a day (BID) | ORAL | 0 refills | Status: DC

## 2017-05-20 MED ORDER — IBUPROFEN 800 MG PO TABS: 800.0000 mg | ORAL_TABLET | Freq: Every evening | ORAL | 5 refills | Status: DC

## 2017-05-20 MED ORDER — FLUTICASONE PROPIONATE 50 MCG/ACT NA SUSP: 2.0000 | Freq: Every day | NASAL | 11 refills | Status: DC

## 2017-05-20 MED ORDER — MONTELUKAST SODIUM 10 MG PO TABS: 10.0000 mg | ORAL_TABLET | Freq: Every day | ORAL | 3 refills | Status: DC

## 2017-05-20 MED ORDER — HYDROCHLOROTHIAZIDE 12.5 MG PO CAPS: ORAL_CAPSULE | ORAL | 3 refills | Status: DC

## 2017-05-20 NOTE — Patient Instructions (Signed)

## 2017-05-20 NOTE — Progress Notes (Signed)
[name], Female    DOB: 1956-01-11, 61 y.o.   MRN: [id number] Visit Date: 05/20/2017  Today's Provider: Mar Daring, PA-C   Chief Complaint  Patient presents with  . Annual Exam   Subjective:    Annual physical exam Cathy Townsend is a 61 y.o. female who presents today for health maintenance and complete physical. She feels fairly well. She reports exercising daily stretching 20 minutes. She reports she is sleeping well.  05/15/16 CPE 03/13/12 Pap-neg; HPV-neg; patient declines 08/03/16 Mammogram-BI-RADS 0; patient declines repeats 07/13/16 Colonoscopy-poor prep of colon, recheck in 6 months; patient declines ----------------------------------------------------------------- Patient reports cough and congestion x's 1 week.   Review of Systems  Constitutional: Negative.   HENT: Positive for congestion.   Eyes: Negative.   Respiratory: Positive for wheezing.   Cardiovascular: Negative.   Gastrointestinal: Negative.   Endocrine: Negative.   Genitourinary: Negative.   Musculoskeletal: Negative.   Skin: Negative.   Allergic/Immunologic: Negative.   Neurological: Negative.   Hematological: Negative.   Psychiatric/Behavioral: Negative.     Social History      She  reports that she has been smoking cigarettes.  She has a 20.00 pack-year smoking history. she has never used smokeless tobacco. She reports that she drinks about 7.2 oz of alcohol per week. She reports that she does not use drugs.       Social History   Socioeconomic History  . Marital status: Married    Spouse name: None  . Number of children: 2  . Years of education: None  . Highest education level: None  Social Needs  . Financial resource strain: None  . Food insecurity - worry: None  . Food insecurity - inability: None  . Transportation needs - medical: None  . Transportation needs - non-medical: None  Occupational History  . None  Tobacco Use  . Smoking status:  Current Every Day Smoker    Packs/day: 0.50    Years: 40.00    Pack years: 20.00    Types: Cigarettes  . Smokeless tobacco: Never Used  Substance and Sexual Activity  . Alcohol use: Yes    Alcohol/week: 7.2 oz    Types: 12 Cans of beer per week    Comment: 05/06/2012 "3 cans of beer @ a time; more than that sometimes on weekends when I'm not working"  . Drug use: No  . Sexual activity: No  Other Topics Concern  . None  Social History Narrative  . None    Past Medical History:  Diagnosis Date  . Allergy   . Arthritis    "think I have it in both knees" (05/06/2012)  . Hypertension   . PONV (postoperative nausea and vomiting)      Patient Active Problem List   Diagnosis Date Noted  . Primary osteoarthritis of knee 02/10/2015  . Allergic rhinitis 02/08/2015  . Edema 02/08/2015  . Cardiac murmur 01/24/2015  . Failed total knee, right ([place]) 05/07/2012  . Avitaminosis D 11/09/2009  . Adiposity 02/17/2008  . Tobacco use 02/17/2008  . Hypercholesterolemia without hypertriglyceridemia 05/28/2002  . 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 COLONOSCOPY WITH PROPOFOL N/A 07/13/2016   Procedure: COLONOSCOPY WITH PROPOFOL;  Surgeon: Jonathon Bellows, MD;  Location: [facility] ENDOSCOPY;  Service: Endoscopy;  Laterality: N/A;  . DILATION AND CURETTAGE OF UTERUS    . JOINT REPLACEMENT     right knee  .  REPLACEMENT TOTAL KNEE  ~ 2008   right  . TOTAL KNEE ARTHROPLASTY Left 02/10/2015   Procedure: TOTAL KNEE ARTHROPLASTY;  Surgeon: Hessie Knows, MD;  Location: [facility] ORS;  Service: Orthopedics;  Laterality: Left;  . TOTAL KNEE REVISION  05/05/2012   Procedure: TOTAL KNEE REVISION;  Surgeon: Kerin Salen, MD;  Location: [facility];  Service: Orthopedics;  Laterality: Right;  . TOTAL THYROIDECTOMY  1990's  . TUBAL LIGATION      Family History        Family Status  Relation Name Status  . Mother  Alive  . Brother  Deceased at age 76  .  Father  Deceased at age 65  . Sister  Deceased at age 65  . Brother  Deceased at age 63       gunshot wound  . Daughter  Alive  . Son  Alive  . Sister  Alive  . Sister  Alive  . Sister  Alive        Her family history includes CAD in her mother; Heart attack in her brother; Heart disease in her mother.     Allergies  Allergen Reactions  . Oxycodone-Acetaminophen Itching  . Tramadol Itching  . Meloxicam Rash     Current Outpatient Medications:  .  aspirin EC 81 MG tablet, Take 81 mg by mouth., Disp: , Rfl:  .  fluticasone (FLONASE) 50 MCG/ACT nasal spray, Place 2 sprays into both nostrils daily., Disp: 16 g, Rfl: 11 .  hydrochlorothiazide (MICROZIDE) 12.5 MG capsule, TAKE 1-2 CAPSULES BY MOUTH ONCE DAILY, Disp: 60 capsule, Rfl: 0 .  ibuprofen (ADVIL,MOTRIN) 800 MG tablet, TAKE 1 TABLET (800 MG TOTAL) BY MOUTH EVERY EVENING., Disp: 30 tablet, Rfl: 5 .  loratadine (CLARITIN) 10 MG tablet, TAKE 1 TABLET (10 MG TOTAL) BY MOUTH DAILY. REPORTED ON 08/31/2015, Disp: 90 tablet, Rfl: 3 .  montelukast (SINGULAIR) 10 MG tablet, TAKE 1 TABLET (10 MG TOTAL) BY MOUTH DAILY., Disp: 30 tablet, Rfl: 5   Patient Care Team: Mar Daring, PA-C as PCP - General (Family Medicine)      Objective:   Vitals: BP 90/60 (BP Location: Left Arm, Patient Position: Sitting, Cuff Size: Large)   Pulse 62   Temp 97.8 F (36.6 C) (Oral)   Resp 16   Ht 5\' 2"  (1.575 m)   Wt 210 lb (95.3 kg)   SpO2 99%   BMI 38.41 kg/m    Vitals:   05/20/17 0914  BP: 90/60  Pulse: 62  Resp: 16  Temp: 97.8 F (36.6 C)  TempSrc: Oral  SpO2: 99%  Weight: 210 lb (95.3 kg)  Height: 5\' 2"  (1.575 m)     Physical Exam  Constitutional: She is oriented to person, place, and time. She appears well-developed and well-nourished. No distress.  HENT:  Head: Normocephalic and atraumatic.  Right Ear: Hearing, tympanic membrane, external ear and ear canal normal.  Left Ear: Hearing, tympanic membrane, external ear and  ear canal normal.  Nose: Right sinus exhibits maxillary sinus tenderness. Right sinus exhibits no frontal sinus tenderness. Left sinus exhibits maxillary sinus tenderness. Left sinus exhibits no frontal sinus tenderness.  Mouth/Throat: Uvula is midline, oropharynx is clear and moist and mucous membranes are normal. No oropharyngeal exudate.  Ethmoid tenderness  Eyes: Conjunctivae and EOM are normal. Pupils are equal, round, and reactive to light. Right eye exhibits no discharge. Left eye exhibits no discharge. No scleral icterus.  Neck: Normal range of motion. Neck supple. No JVD  present. Carotid bruit is not present. No tracheal deviation present. No thyromegaly present.  Cardiovascular: Normal rate, regular rhythm and intact distal pulses. Exam reveals no gallop and no friction rub.  Murmur heard. Pulmonary/Chest: Effort normal and breath sounds normal. No respiratory distress. She has no wheezes. She has no rales. She exhibits no tenderness.  Abdominal: Soft. Bowel sounds are normal. She exhibits no distension and no mass. There is no tenderness. There is no rebound and no guarding.  Musculoskeletal: Normal range of motion. She exhibits no edema or tenderness.  Lymphadenopathy:    She has no cervical adenopathy.  Neurological: She is alert and oriented to person, place, and time.  Skin: Skin is warm and dry. No rash noted. She is not diaphoretic.  Psychiatric: She has a normal mood and affect. Her behavior is normal. Judgment and thought content normal.  Vitals reviewed.    Depression Screen PHQ 2/9 Scores 05/20/2017 05/15/2016  PHQ - 2 Score 0 0  PHQ- 9 Score 0 -      Assessment & Plan:     Routine Health Maintenance and Physical Exam  Exercise Activities and Dietary recommendations Goals    None      Immunization History  Administered Date(s) Administered  . Pneumococcal Polysaccharide-23 04/06/2014  . Td 05/28/2002  . Tdap 11/09/2009  . Zoster 12/31/2013    Health  Maintenance  Topic Date Due  . HIV Screening  02/11/1969  . PAP SMEAR  03/14/2015  . INFLUENZA VACCINE  01/16/2017  . MAMMOGRAM  04/03/2018  . TETANUS/TDAP  11/10/2019  . COLONOSCOPY  07/13/2026  . Hepatitis C Screening  Completed     Discussed health benefits of physical activity, and encouraged her to engage in regular exercise appropriate for her age and condition.    1. Annual physical exam Normal physical exam today. Will check labs as below and f/u pending lab results. If labs are stable and WNL she will not need to have these rechecked for one year at her next annual physical exam. She is to call the office in the meantime if she has any acute issue, questions or concerns.  2. Colon cancer screening Had in Jan 2018 but patient did not have good prep, was to be repeated in 6 months but patient refuses.   3. Cervical cancer screening Patient refuses pap, breast exam and mammogram.   4. Cardiac murmur Undiagnosed. Asymptomatic. Patient does not desire cardiology referral at this time.   5. Allergic rhinitis Stable. Diagnosis pulled for medication refill. Continue current medical treatment plan. - CBC with Differential/Platelet - fluticasone (FLONASE) 50 MCG/ACT nasal spray; Place 2 sprays into both nostrils daily.  Dispense: 16 g; Refill: 11 - montelukast (SINGULAIR) 10 MG tablet; Take 1 tablet (10 mg total) by mouth daily.  Dispense: 90 tablet; Refill: 3  6. Hypercholesterolemia without hypertriglyceridemia Will check labs as below and f/u pending results. - Lipid panel - TSH  7. Avitaminosis D Will check labs as below and f/u pending results. - VITAMIN D 25 Hydroxy (Vit-D Deficiency, Fractures)  8. Class 2 obesity with body mass index (BMI) of 38.0 to 38.9 in adult, unspecified obesity type, unspecified whether serious comorbidity present Counseled patient on healthy lifestyle modifications including dieting and exercise.  I will see her back in 4 weeks to recheck her  weight and consider appetite suppressant.  - TSH  9. Acute pansinusitis, recurrence not specified Worsening symptoms that have not responded to OTC medications. Will give augmentin as below. Continue allergy  medications. Stay well hydrated and get plenty of rest. Call if no symptom improvement or if symptoms worsen. - brompheniramine-pseudoephedrine-DM 30-2-10 MG/5ML syrup; Take 5 mLs by mouth 3 (three) times daily as needed.  Dispense: 180 mL; Refill: 0 - amoxicillin-clavulanate (AUGMENTIN) 875-125 MG tablet; Take 1 tablet by mouth 2 (two) times daily.  Dispense: 20 tablet; Refill: 0  10. Cough Bromfed-DM given for cough as below.  - brompheniramine-pseudoephedrine-DM 30-2-10 MG/5ML syrup; Take 5 mLs by mouth 3 (three) times daily as needed.  Dispense: 180 mL; Refill: 0  11. Bilateral edema of lower extremity Stable. Diagnosis pulled for medication refill. Continue current medical treatment plan. - hydrochlorothiazide (MICROZIDE) 12.5 MG capsule; TAKE 1-2 CAPSULES BY MOUTH ONCE DAILY  Dispense: 180 capsule; Refill: 3  12. Primary osteoarthritis of knee, unspecified laterality Stable. Diagnosis pulled for medication refill. Continue current medical treatment plan. - ibuprofen (ADVIL,MOTRIN) 800 MG tablet; Take 1 tablet (800 mg total) by mouth every evening.  Dispense: 30 tablet; Refill: 5  13. Screening for HIV (human immunodeficiency virus) - HIV antibody  14. Need for influenza vaccination Flu vaccine given today without complication. Patient sat upright for 15 minutes to check for adverse reaction before being released. - Flu Vaccine QUAD 36+ mos IM  --------------------------------------------------------------------    Mar Daring, PA-C  [facility]

## 2017-05-21 ENCOUNTER — Telehealth: Payer: Self-pay

## 2017-05-21 LAB — CBC WITH DIFFERENTIAL/PLATELET
BASOS PCT: 1.1 %
Basophils Absolute: 40 cells/uL (ref 0–200)
EOS ABS: 151 {cells}/uL (ref 15–500)
EOS PCT: 4.2 %
HCT: 31.9 % — ABNORMAL LOW (ref 35.0–45.0)
HEMOGLOBIN: 10.8 g/dL — AB (ref 11.7–15.5)
Lymphs Abs: 1213 cells/uL (ref 850–3900)
MCH: 29.1 pg (ref 27.0–33.0)
MCHC: 33.9 g/dL (ref 32.0–36.0)
MCV: 86 fL (ref 80.0–100.0)
MONOS PCT: 12.3 %
MPV: 10.8 fL (ref 7.5–12.5)
NEUTROS ABS: 1753 {cells}/uL (ref 1500–7800)
Neutrophils Relative %: 48.7 %
Platelets: 339 10*3/uL (ref 140–400)
RBC: 3.71 10*6/uL — AB (ref 3.80–5.10)
RDW: 13.6 % (ref 11.0–15.0)
Total Lymphocyte: 33.7 %
WBC mixed population: 443 cells/uL (ref 200–950)
WBC: 3.6 10*3/uL — AB (ref 3.8–10.8)

## 2017-05-21 LAB — LIPID PANEL
CHOL/HDL RATIO: 3.5 (calc) (ref <5.0)
CHOLESTEROL: 217 mg/dL — AB (ref <200)
HDL: 62 mg/dL (ref >50)
LDL CHOLESTEROL (CALC): 139 mg/dL — AB
Non-HDL Cholesterol (Calc): 155 mg/dL (calc) — ABNORMAL HIGH (ref <130)
TRIGLYCERIDES: 66 mg/dL (ref <150)

## 2017-05-21 LAB — VITAMIN D 25 HYDROXY (VIT D DEFICIENCY, FRACTURES): Vit D, 25-Hydroxy: 28 ng/mL — ABNORMAL LOW (ref 30–100)

## 2017-05-21 LAB — HIV ANTIBODY (ROUTINE TESTING W REFLEX): HIV 1&2 Ab, 4th Generation: NONREACTIVE

## 2017-05-21 LAB — TSH: TSH: 0.85 mIU/L (ref 0.40–4.50)

## 2017-05-21 NOTE — Telephone Encounter (Signed)
-----   Message from Mar Daring, [place] sent at 05/21/2017  8:38 AM EST ----- Hemoglobin has dropped when compared to one year ago. Would recommend to return for me to perform a rectal exam to check for GI bleeding or to give her 3 hemoccult cards for her to collect and bring back to check for GI bleeding. All other labs are stable.

## 2017-05-21 NOTE — Telephone Encounter (Signed)
Patient advised as directed below. Patient scheduled to followed up Dec 17 at 8:30.  Thanks,  -Joseline

## 2017-06-03 ENCOUNTER — Ambulatory Visit: Payer: Self-pay | Admitting: Physician Assistant

## 2017-06-03 ENCOUNTER — Encounter: Payer: Self-pay | Admitting: Physician Assistant

## 2017-06-03 VITALS — BP 130/70 | HR 70 | Temp 97.9°F | Resp 16 | Ht 62.0 in | Wt 208.6 lb

## 2017-06-03 DIAGNOSIS — E611 Iron deficiency: Secondary | ICD-10-CM

## 2017-06-03 DIAGNOSIS — R059 Cough, unspecified: Secondary | ICD-10-CM

## 2017-06-03 DIAGNOSIS — R05 Cough: Secondary | ICD-10-CM

## 2017-06-03 DIAGNOSIS — R710 Precipitous drop in hematocrit: Secondary | ICD-10-CM

## 2017-06-03 LAB — IFOBT (OCCULT BLOOD): IFOBT: NEGATIVE

## 2017-06-03 MED ORDER — FERROUS SULFATE 325 (65 FE) MG PO TABS: 325.0000 mg | ORAL_TABLET | Freq: Every day | ORAL | 1 refills | Status: DC

## 2017-06-03 MED ORDER — PSEUDOEPH-BROMPHEN-DM 30-2-10 MG/5ML PO SYRP: 5.0000 mL | ORAL_SOLUTION | Freq: Three times a day (TID) | ORAL | 0 refills | Status: DC | PRN

## 2017-06-03 MED ORDER — BENZONATATE 200 MG PO CAPS: 200.0000 mg | ORAL_CAPSULE | Freq: Two times a day (BID) | ORAL | 0 refills | Status: DC | PRN

## 2017-06-03 NOTE — Patient Instructions (Signed)

## 2017-06-03 NOTE — Addendum Note (Signed)
Addended by: Mar Daring on: 06/03/2017 11:01 AM   Modules accepted: Orders

## 2017-06-03 NOTE — Progress Notes (Signed)
[name] Female    DOB: [birth year]   61 y.o.   MRN: [id number] Visit Date: 06/03/2017  Today's Provider: Mar Daring, PA-C   No chief complaint on file.  Subjective:    HPI Patient is here today to follow on her labs and for rectal exam to check for GI bleeding. HgB dropped from 12.7 in 2017 to 10.8 this year.   Cough: Per patient the cough still persist at night. On 12/3 she was prescribed Augmentin and cough syrup to treat acute pansinusitis.     Allergies  Allergen Reactions  . Oxycodone-Acetaminophen Itching  . Tramadol Itching  . Meloxicam Rash     Current Outpatient Medications:  .  amoxicillin-clavulanate (AUGMENTIN) 875-125 MG tablet, Take 1 tablet by mouth 2 (two) times daily., Disp: 20 tablet, Rfl: 0 .  aspirin EC 81 MG tablet, Take 81 mg by mouth., Disp: , Rfl:  .  brompheniramine-pseudoephedrine-DM 30-2-10 MG/5ML syrup, Take 5 mLs by mouth 3 (three) times daily as needed., Disp: 180 mL, Rfl: 0 .  fluticasone (FLONASE) 50 MCG/ACT nasal spray, Place 2 sprays into both nostrils daily., Disp: 16 g, Rfl: 11 .  hydrochlorothiazide (MICROZIDE) 12.5 MG capsule, TAKE 1-2 CAPSULES BY MOUTH ONCE DAILY, Disp: 180 capsule, Rfl: 3 .  ibuprofen (ADVIL,MOTRIN) 800 MG tablet, Take 1 tablet (800 mg total) by mouth every evening., Disp: 30 tablet, Rfl: 5 .  loratadine (CLARITIN) 10 MG tablet, TAKE 1 TABLET (10 MG TOTAL) BY MOUTH DAILY. REPORTED ON 08/31/2015, Disp: 90 tablet, Rfl: 3 .  montelukast (SINGULAIR) 10 MG tablet, Take 1 tablet (10 mg total) by mouth daily., Disp: 90 tablet, Rfl: 3  Review of Systems  Constitutional: Negative for fatigue.  HENT: Positive for congestion, postnasal drip and sinus pressure. Negative for sore throat.   Respiratory: Positive for cough. Negative for chest tightness, shortness of breath and wheezing.   Cardiovascular: Negative for chest pain, palpitations and leg swelling.  Gastrointestinal: Negative for abdominal  pain.  Neurological: Negative for dizziness, weakness, light-headedness, numbness and headaches.    Social History   Tobacco Use  . Smoking status: Current Every Day Smoker    Packs/day: 0.50    Years: 40.00    Pack years: 20.00    Types: Cigarettes  . Smokeless tobacco: Never Used  Substance Use Topics  . Alcohol use: Yes    Alcohol/week: 7.2 oz    Types: 12 Cans of beer per week    Comment: 05/06/2012 "3 cans of beer @ a time; more than that sometimes on weekends when I'm not working"   Objective:   BP 130/70 (BP Location: Left Arm, Patient Position: Sitting, Cuff Size: Normal)   Pulse 70   Temp 97.9 F (36.6 C) (Oral)   Resp 16   Ht 5\' 2"  (1.575 m)   Wt 208 lb 9.6 oz (94.6 kg)   SpO2 98%   BMI 38.15 kg/m    Physical Exam  Constitutional: She appears well-developed and well-nourished. No distress.  Neck: Normal range of motion. Neck supple.  Cardiovascular: Normal rate, regular rhythm and normal heart sounds. Exam reveals no gallop and no friction rub.  No murmur heard. Pulmonary/Chest: Effort normal and breath sounds normal. No respiratory distress. She has no wheezes. She has no rales.  Genitourinary: Rectal exam shows external hemorrhoid. Rectal exam shows no internal hemorrhoid, no fissure, no mass, no tenderness, anal tone normal and guaiac negative stool.  Skin: She is not diaphoretic.  Vitals reviewed.     Assessment & Plan:     1. Decreased hemoglobin OC lite collected today and was negative. Suspect iron def anemia. Will start ferrous sulfate 325mg  and recheck labs in 3 months.  - IFOBT POC (occult bld, rslt in office); Future  2. Iron deficiency See above medical treatment plan. - ferrous sulfate 325 (65 FE) MG tablet; Take 1 tablet (325 mg total) by mouth daily with breakfast.  Dispense: 90 tablet; Refill: 1  3. Cough Will give tessalon perles for cough suppression. Discussed CXR since patient a smoker but she declines at this time.  - benzonatate  (TESSALON) 200 MG capsule; Take 1 capsule (200 mg total) by mouth 2 (two) times daily as needed for cough.  Dispense: 20 capsule; Refill: 0       Mar Daring, PA-C  [facility]

## 2017-06-17 ENCOUNTER — Ambulatory Visit: Payer: Self-pay | Admitting: Physician Assistant

## 2017-06-17 NOTE — Progress Notes (Deleted)
       [name] Female    DOB: 1955-10-02   61 y.o.   MRN: [id number] Visit Date: 06/17/2017  Today's Provider: Mar Daring, PA-C   No chief complaint on file.  Subjective:    HPI  Follow Up, weight  Patient is here for weight follow up.  She was recently seen 4 weeks ago. Treatment for this included healthy lifestyle dieting and exercising and to follow-up in 4 weeks to recheck weight and consider appetite suppressant. She reports {DESC; EXCELLENT/GOOD/FAIR:19992} compliance with treatment. She reports this condition is {improved/worse/unchanged:3041574}.  ------------------------------------------------------------------------------------     Allergies  Allergen Reactions  . Oxycodone-Acetaminophen Itching  . Tramadol Itching  . Meloxicam Rash     Current Outpatient Medications:  .  aspirin EC 81 MG tablet, Take 81 mg by mouth., Disp: , Rfl:  .  benzonatate (TESSALON) 200 MG capsule, Take 1 capsule (200 mg total) by mouth 2 (two) times daily as needed for cough., Disp: 20 capsule, Rfl: 0 .  brompheniramine-pseudoephedrine-DM 30-2-10 MG/5ML syrup, Take 5 mLs by mouth 3 (three) times daily as needed., Disp: 180 mL, Rfl: 0 .  ferrous sulfate 325 (65 FE) MG tablet, Take 1 tablet (325 mg total) by mouth daily with breakfast., Disp: 90 tablet, Rfl: 1 .  fluticasone (FLONASE) 50 MCG/ACT nasal spray, Place 2 sprays into both nostrils daily., Disp: 16 g, Rfl: 11 .  hydrochlorothiazide (MICROZIDE) 12.5 MG capsule, TAKE 1-2 CAPSULES BY MOUTH ONCE DAILY, Disp: 180 capsule, Rfl: 3 .  ibuprofen (ADVIL,MOTRIN) 800 MG tablet, Take 1 tablet (800 mg total) by mouth every evening., Disp: 30 tablet, Rfl: 5 .  loratadine (CLARITIN) 10 MG tablet, TAKE 1 TABLET (10 MG TOTAL) BY MOUTH DAILY. REPORTED ON 08/31/2015, Disp: 90 tablet, Rfl: 3 .  montelukast (SINGULAIR) 10 MG tablet, Take 1 tablet (10 mg total) by mouth daily., Disp: 90 tablet, Rfl: 3  Review of  Systems  Social History   Tobacco Use  . Smoking status: Current Every Day Smoker    Packs/day: 0.50    Years: 40.00    Pack years: 20.00    Types: Cigarettes  . Smokeless tobacco: Never Used  Substance Use Topics  . Alcohol use: Yes    Alcohol/week: 7.2 oz    Types: 12 Cans of beer per week    Comment: 05/06/2012 "3 cans of beer @ a time; more than that sometimes on weekends when I'm not working"   Objective:   There were no vitals taken for this visit.   Physical Exam      Assessment & Plan:           Mar Daring, PA-C  [facility]

## 2017-08-16 ENCOUNTER — Telehealth: Payer: Self-pay | Admitting: Physician Assistant

## 2017-08-16 DIAGNOSIS — R059 Cough, unspecified: Secondary | ICD-10-CM

## 2017-08-16 DIAGNOSIS — R05 Cough: Secondary | ICD-10-CM

## 2017-08-16 MED ORDER — BENZONATATE 200 MG PO CAPS: 200.0000 mg | ORAL_CAPSULE | Freq: Two times a day (BID) | ORAL | 0 refills | Status: DC | PRN

## 2017-08-16 NOTE — Telephone Encounter (Signed)
Sent in [place] perle

## 2017-08-16 NOTE — Telephone Encounter (Signed)
NA  ED 

## 2017-08-16 NOTE — Telephone Encounter (Signed)
Patient wanted to know if you will call in something for her stop up nose, she states that you have gave it to her before, didn't know the medicine but described it as a yellow pearl   CB# [place]

## 2017-10-08 ENCOUNTER — Telehealth: Payer: Self-pay

## 2017-10-08 DIAGNOSIS — R05 Cough: Secondary | ICD-10-CM

## 2017-10-08 DIAGNOSIS — R059 Cough, unspecified: Secondary | ICD-10-CM

## 2017-10-08 NOTE — Telephone Encounter (Signed)
Patient is requesting a cough medication [facility]. She states she has a non-productive cough that started 2 days ago and is worse at night. Patient advised Tawanna Sat is out of the office this afternoon.  CB#[phone]

## 2017-10-09 MED ORDER — BENZONATATE 200 MG PO CAPS: 200.0000 mg | ORAL_CAPSULE | Freq: Two times a day (BID) | ORAL | 0 refills | Status: DC | PRN

## 2017-10-09 NOTE — Telephone Encounter (Signed)
Sent in tessalon perles for cough

## 2017-10-09 NOTE — Telephone Encounter (Signed)
Tried calling patient. Left message to call back. 

## 2017-10-10 NOTE — Telephone Encounter (Signed)
Advised patient as below.  

## 2017-11-16 ENCOUNTER — Other Ambulatory Visit: Payer: Self-pay | Admitting: Physician Assistant

## 2017-11-16 DIAGNOSIS — M1710 Unilateral primary osteoarthritis, unspecified knee: Secondary | ICD-10-CM

## 2017-11-25 ENCOUNTER — Other Ambulatory Visit: Payer: Self-pay | Admitting: Physician Assistant

## 2017-11-25 DIAGNOSIS — E611 Iron deficiency: Secondary | ICD-10-CM

## 2017-12-02 ENCOUNTER — Encounter: Payer: Self-pay | Admitting: Physician Assistant

## 2017-12-02 ENCOUNTER — Ambulatory Visit (INDEPENDENT_AMBULATORY_CARE_PROVIDER_SITE_OTHER): Payer: Self-pay | Admitting: Physician Assistant

## 2017-12-02 VITALS — BP 100/68 | HR 76 | Temp 98.8°F | Resp 16 | Wt 214.2 lb

## 2017-12-02 DIAGNOSIS — L299 Pruritus, unspecified: Secondary | ICD-10-CM

## 2017-12-02 MED ORDER — HYDROXYZINE HCL 10 MG PO TABS: 10.0000 mg | ORAL_TABLET | Freq: Three times a day (TID) | ORAL | 0 refills | Status: DC | PRN

## 2017-12-02 MED ORDER — PREDNISONE 10 MG PO TABS: ORAL_TABLET | ORAL | 0 refills | Status: DC

## 2017-12-02 NOTE — Progress Notes (Signed)
[name] Female    DOB: 07/02/1955   61 y.o.   MRN: [id number] Visit Date: 12/02/2017  Today's Provider: Mar Daring, PA-C   No chief complaint on file.  Subjective:    HPI Patient here today with c/o itching. She reports that she sees Dermatology and she gets a shot. Report that she is taking Benadryl and Cortisone. No known rash or exposure. Reports occurs with change of seasons.   She was previously seen at [facility] on 11/18/17 and given a prednisone burst of 60mg  x 3 days.   She was previously followed by Dr. Koleen Nimrod for this issue. There has never been an associated rash. She complains of "dry skin" with "severe itching". Most recent dermatology visit prior to the one stated above was in 2017. She reports she would see Dr. Koleen Nimrod and he would give her a shot of kenalog. She reports only needing shots once or twice per year for this.      Allergies  Allergen Reactions  . Oxycodone-Acetaminophen Itching  . Tramadol Itching  . Meloxicam Rash     Current Outpatient Medications:  .  aspirin EC 81 MG tablet, Take 81 mg by mouth., Disp: , Rfl:  .  ferrous sulfate 325 (65 FE) MG tablet, TAKE 1 TABLET BY MOUTH EVERY DAY WITH BREAKFAST, Disp: 90 tablet, Rfl: 1 .  fluticasone (FLONASE) 50 MCG/ACT nasal spray, Place 2 sprays into both nostrils daily., Disp: 16 g, Rfl: 11 .  hydrochlorothiazide (MICROZIDE) 12.5 MG capsule, TAKE 1-2 CAPSULES BY MOUTH ONCE DAILY, Disp: 180 capsule, Rfl: 3 .  ibuprofen (ADVIL,MOTRIN) 800 MG tablet, TAKE 1 TABLET (800 MG TOTAL) BY MOUTH EVERY EVENING., Disp: 30 tablet, Rfl: 5 .  loratadine (CLARITIN) 10 MG tablet, TAKE 1 TABLET (10 MG TOTAL) BY MOUTH DAILY. REPORTED ON 08/31/2015, Disp: 90 tablet, Rfl: 3 .  montelukast (SINGULAIR) 10 MG tablet, Take 1 tablet (10 mg total) by mouth daily., Disp: 90 tablet, Rfl: 3 .  benzonatate (TESSALON) 200 MG capsule, Take 1 capsule (200 mg total) by mouth 2 (two) times daily as needed  for cough. (Patient not taking: Reported on 12/02/2017), Disp: 20 capsule, Rfl: 0  Review of Systems  Constitutional: Negative.   Respiratory: Negative.   Cardiovascular: Negative.   Musculoskeletal: Negative.   Neurological: Negative.     Social History   Tobacco Use  . Smoking status: Current Every Day Smoker    Packs/day: 0.50    Years: 40.00    Pack years: 20.00    Types: Cigarettes  . Smokeless tobacco: Never Used  Substance Use Topics  . Alcohol use: Yes    Alcohol/week: 7.2 oz    Types: 12 Cans of beer per week    Comment: 05/06/2012 "3 cans of beer @ a time; more than that sometimes on weekends when I'm not working"   Objective:   BP 100/68 (BP Location: Left Arm, Patient Position: Sitting, Cuff Size: Normal)   Pulse 76   Temp 98.8 F (37.1 C) (Oral)   Resp 16   Wt 214 lb 3.2 oz (97.2 kg)   SpO2 98%   BMI 39.18 kg/m    Physical Exam  Constitutional: She appears well-developed and well-nourished. No distress.  Neck: Normal range of motion. Neck supple. No JVD present. No tracheal deviation present. No thyromegaly present.  Cardiovascular: Normal rate, regular rhythm and normal heart sounds. Exam reveals no gallop and no friction rub.  No murmur heard.  Pulmonary/Chest: Effort normal and breath sounds normal. No respiratory distress. She has no wheezes. She has no rales.  Lymphadenopathy:    She has no cervical adenopathy.  Skin: Skin is warm and dry. Capillary refill takes less than 2 seconds. No rash noted. She is not diaphoretic. No erythema.  Vitals reviewed.      Assessment & Plan:     1. Pruritus Will give longer taper of prednisone since we do not have kenalog here in this office. Will add hydroxyzine for itching. Advised of drowsiness. Advised to not take benadryl with hydroxyzine due to drowsiness. Call if symptoms do not improve or worsen.  - predniSONE (DELTASONE) 10 MG tablet; Take 6 tabs PO on day 1&2, 5 tabs PO on day 3&4, 4 tabs PO on day 5&6,  3 tabs PO on day 7&8, 2 tabs PO on day 9&10, 1 tab PO on day 11&12.  Dispense: 42 tablet; Refill: 0 - hydrOXYzine (ATARAX/VISTARIL) 10 MG tablet; Take 1 tablet (10 mg total) by mouth 3 (three) times daily as needed.  Dispense: 30 tablet; Refill: 0 - Comprehensive Metabolic Panel (CMET)       Mar Daring, PA-C  [facility]

## 2017-12-02 NOTE — Patient Instructions (Signed)
Pruritus  Pruritus is an itching feeling. There are many different conditions and factors that can make your skin itchy. Dry skin is one of the most common causes of itching. Most cases of itching do not require medical attention. Itchy skin can turn into a rash.  Follow these instructions at home:  Watch your pruritus for any changes. Take these steps to help with your condition:  Skin Care  · Moisturize your skin as needed. A moisturizer that contains petroleum jelly is best for keeping moisture in your skin.  · Take or apply medicines only as directed by your health care provider. This may include:  ? Corticosteroid cream.  ? Anti-itch lotions.  ? Oral anti-histamines.  · Apply cool compresses to the affected areas.  · Try taking a bath with:  ? Epsom salts. Follow the instructions on the packaging. You can get these at your local pharmacy or grocery store.  ? Baking soda. Pour a small amount into the bath as directed by your health care provider.  ? Colloidal oatmeal. Follow the instructions on the packaging. You can get this at your local pharmacy or grocery store.  · Try applying baking soda paste to your skin. Stir water into baking soda until it reaches a paste-like consistency.  · Do not scratch your skin.  · Avoid hot showers or baths, which can make itching worse. A cold shower may help with itching as long as you use a moisturizer after.  · Avoid scented soaps, detergents, and perfumes. Use gentle soaps, detergents, perfumes, and other cosmetic products.  General instructions  · Avoid wearing tight clothes.  · Keep a journal to help track what causes your itch. Write down:  ? What you eat.  ? What cosmetic products you use.  ? What you drink.  ? What you wear. This includes jewelry.  · Use a humidifier. This keeps the air moist, which helps to prevent dry skin.  Contact a health care provider if:  · The itching does not go away after several days.  · You sweat at night.  · You have weight loss.  · You  are unusually thirsty.  · You urinate more than normal.  · You are more tired than normal.  · You have abdominal pain.  · Your skin tingles.  · You feel weak.  · Your skin or the whites of your eyes look yellow (jaundice).  · Your skin feels numb.  This information is not intended to replace advice given to you by your health care provider. Make sure you discuss any questions you have with your health care provider.  Document Released: 02/14/2011 Document Revised: 11/10/2015 Document Reviewed: 05/31/2014  Elsevier Interactive Patient Education © 2018 [employer]

## 2017-12-03 ENCOUNTER — Telehealth: Payer: Self-pay

## 2017-12-03 LAB — COMPREHENSIVE METABOLIC PANEL
ALT: 16 IU/L (ref 0–32)
AST: 19 IU/L (ref 0–40)
Albumin/Globulin Ratio: 1.5 (ref 1.2–2.2)
Albumin: 4.1 g/dL (ref 3.6–4.8)
Alkaline Phosphatase: 72 IU/L (ref 39–117)
BUN/Creatinine Ratio: 18 (ref 12–28)
BUN: 12 mg/dL (ref 8–27)
Bilirubin Total: 0.3 mg/dL (ref 0.0–1.2)
CHLORIDE: 101 mmol/L (ref 96–106)
CO2: 27 mmol/L (ref 20–29)
CREATININE: 0.65 mg/dL (ref 0.57–1.00)
Calcium: 10.1 mg/dL (ref 8.7–10.3)
GFR, EST AFRICAN AMERICAN: 109 mL/min/{1.73_m2} (ref >59)
GFR, EST NON AFRICAN AMERICAN: 95 mL/min/{1.73_m2} (ref >59)
GLUCOSE: 97 mg/dL (ref 65–99)
Globulin, Total: 2.8 g/dL (ref 1.5–4.5)
Potassium: 4.1 mmol/L (ref 3.5–5.2)
Sodium: 143 mmol/L (ref 134–144)
TOTAL PROTEIN: 6.9 g/dL (ref 6.0–8.5)

## 2017-12-03 NOTE — Telephone Encounter (Signed)
-----   Message from Mar Daring, PA-C sent at 12/03/2017  8:26 AM EDT ----- Kidney and liver function are normal.

## 2017-12-03 NOTE — Telephone Encounter (Signed)
Patient advised as directed below.  Thanks,  -Jaspreet Hollings 

## 2017-12-23 ENCOUNTER — Other Ambulatory Visit: Payer: Self-pay | Admitting: Physician Assistant

## 2017-12-23 NOTE — Telephone Encounter (Signed)
Patient wants a refill on Prednisone for itching all over.  She said she has gotten this before from  You  Because when the weather gets "really" hot she starts itching all over. [place]

## 2017-12-23 NOTE — Telephone Encounter (Signed)
I am not going to refill as this would be her 3rd round in a short time. She needs to f/u with dermatology if still itching.

## 2017-12-23 NOTE — Telephone Encounter (Signed)
Please Review

## 2017-12-23 NOTE — Telephone Encounter (Signed)
Patient advised as directed below.  Thanks,  -Joseline 

## 2018-02-08 ENCOUNTER — Other Ambulatory Visit: Payer: Self-pay | Admitting: Physician Assistant

## 2018-02-08 DIAGNOSIS — L299 Pruritus, unspecified: Secondary | ICD-10-CM

## 2018-03-11 ENCOUNTER — Other Ambulatory Visit: Payer: Self-pay | Admitting: Physician Assistant

## 2018-03-11 DIAGNOSIS — R600 Localized edema: Secondary | ICD-10-CM

## 2018-03-11 MED ORDER — HYDROCHLOROTHIAZIDE 12.5 MG PO CAPS: ORAL_CAPSULE | ORAL | 3 refills | Status: DC

## 2018-03-11 NOTE — Telephone Encounter (Signed)
Pt needing at refill on: hydrochlorothiazide (MICROZIDE) 12.5 MG capsule   Please fill at: [id number] [place],  - [address] [phone] (Phone) [phone] (Fax)    Thanks, [employer]

## 2018-05-10 ENCOUNTER — Other Ambulatory Visit: Payer: Self-pay | Admitting: Physician Assistant

## 2018-05-10 DIAGNOSIS — M1710 Unilateral primary osteoarthritis, unspecified knee: Secondary | ICD-10-CM

## 2018-05-26 ENCOUNTER — Ambulatory Visit (INDEPENDENT_AMBULATORY_CARE_PROVIDER_SITE_OTHER): Payer: Self-pay | Admitting: Physician Assistant

## 2018-05-26 DIAGNOSIS — Z23 Encounter for immunization: Secondary | ICD-10-CM

## 2018-05-29 ENCOUNTER — Other Ambulatory Visit: Payer: Self-pay | Admitting: Physician Assistant

## 2018-05-29 DIAGNOSIS — J309 Allergic rhinitis, unspecified: Secondary | ICD-10-CM

## 2018-05-29 NOTE — Telephone Encounter (Signed)
Cable, faxed refill request for the following medications:  montelukast (SINGULAIR) 10 MG tablet  90 day supply  Last Rx: 05/20/17 Please advise. Thanks TNP

## 2018-05-30 ENCOUNTER — Other Ambulatory Visit: Payer: Self-pay | Admitting: Physician Assistant

## 2018-05-30 DIAGNOSIS — E611 Iron deficiency: Secondary | ICD-10-CM

## 2018-05-30 MED ORDER — MONTELUKAST SODIUM 10 MG PO TABS: 10.0000 mg | ORAL_TABLET | Freq: Every day | ORAL | 3 refills | Status: DC

## 2018-07-04 NOTE — Progress Notes (Addendum)
[name], Female    DOB: 29-Nov-1955, 63 y.o.   MRN: [id number] Visit Date: 07/07/2018  Today's Provider: Mar Daring, PA-C   Chief Complaint  Patient presents with  . Annual Exam   Subjective:    Annual physical exam Cathy Townsend is a 63 y.o. female who presents today for health maintenance and complete physical. She feels well. She reports exercising yes. She reports she is sleeping well. -----------------------------------------------------------------   Review of Systems  Constitutional: Negative.   HENT: Positive for sneezing.   Eyes: Negative.   Respiratory: Positive for cough.   Cardiovascular: Negative.   Gastrointestinal: Negative.   Endocrine: Negative.   Genitourinary: Negative.   Musculoskeletal: Negative.   Skin: Positive for rash.  Allergic/Immunologic: Negative.   Neurological: Negative.   Hematological: Negative.   Psychiatric/Behavioral: Negative.     Social History      She  reports that she has been smoking cigarettes. She has a 20.00 pack-year smoking history. She has never used smokeless tobacco. She reports current alcohol use of about 12.0 standard drinks of alcohol per week. She reports that she does not use drugs.       Social History   Socioeconomic History  . Marital status: Married    Spouse name: Not on file  . Number of children: 2  . Years of education: Not on file  . Highest education level: Not on file  Occupational History  . Not on file  Social Needs  . Financial resource strain: Not on file  . Food insecurity:    Worry: Not on file    Inability: Not on file  . Transportation needs:    Medical: Not on file    Non-medical: Not on file  Tobacco Use  . Smoking status: Current Every Day Smoker    Packs/day: 0.50    Years: 40.00    Pack years: 20.00    Types: Cigarettes  . Smokeless tobacco: Never Used  Substance and Sexual Activity  . Alcohol use: Yes    Alcohol/week: 12.0 standard drinks     Types: 12 Cans of beer per week    Comment: 05/06/2012 "3 cans of beer @ a time; more than that sometimes on weekends when I'm not working"  . Drug use: No  . Sexual activity: Never  Lifestyle  . Physical activity:    Days per week: Not on file    Minutes per session: Not on file  . Stress: Not on file  Relationships  . Social connections:    Talks on phone: Not on file    Gets together: Not on file    Attends religious service: Not on file    Active member of club or organization: Not on file    Attends meetings of clubs or organizations: Not on file    Relationship status: Not on file  Other Topics Concern  . Not on file  Social History Narrative  . Not on file    Past Medical History:  Diagnosis Date  . Allergy   . Arthritis    "think I have it in both knees" (05/06/2012)  . Hypertension   . PONV (postoperative nausea and vomiting)      Patient Active Problem List   Diagnosis Date Noted  . Primary osteoarthritis of knee 02/10/2015  . Allergic rhinitis 02/08/2015  . Edema 02/08/2015  . Cardiac murmur 01/24/2015  . Failed total knee, right ([place]) 05/07/2012  . Avitaminosis D  11/09/2009  . Adiposity 02/17/2008  . Tobacco use 02/17/2008  . Hypercholesterolemia without hypertriglyceridemia 05/28/2002  . 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 COLONOSCOPY WITH PROPOFOL N/A 07/13/2016   Procedure: COLONOSCOPY WITH PROPOFOL;  Surgeon: Jonathon Bellows, MD;  Location: [facility] ENDOSCOPY;  Service: Endoscopy;  Laterality: N/A;  . DILATION AND CURETTAGE OF UTERUS    . JOINT REPLACEMENT     right knee  . REPLACEMENT TOTAL KNEE  ~ 2008   right  . TOTAL KNEE ARTHROPLASTY Left 02/10/2015   Procedure: TOTAL KNEE ARTHROPLASTY;  Surgeon: Hessie Knows, MD;  Location: [facility] ORS;  Service: Orthopedics;  Laterality: Left;  . TOTAL KNEE REVISION  05/05/2012   Procedure: TOTAL KNEE REVISION;  Surgeon: Kerin Salen, MD;  Location: [facility];  Service: Orthopedics;  Laterality: Right;  . TOTAL THYROIDECTOMY  1990's  . TUBAL LIGATION      Family History        Family Status  Relation Name Status  . Mother  Alive  . Brother  Deceased at age 52  . Father  Deceased at age 32  . Sister  Deceased at age 30  . Brother  Deceased at age 34       gunshot wound  . Daughter  Alive  . Son  Alive  . Sister  Alive  . Sister  Alive  . Sister  Alive        Her family history includes CAD in her mother; Heart attack in her brother; Heart disease in her mother.      Allergies  Allergen Reactions  . Oxycodone-Acetaminophen Itching  . Tramadol Itching  . Meloxicam Rash     Current Outpatient Medications:  .  aspirin EC 81 MG tablet, Take 81 mg by mouth., Disp: , Rfl:  .  ferrous sulfate 325 (65 FE) MG tablet, TAKE 1 TABLET BY MOUTH EVERY DAY WITH BREAKFAST, Disp: 90 tablet, Rfl: 1 .  fluticasone (FLONASE) 50 MCG/ACT nasal spray, Place 2 sprays into both nostrils daily., Disp: 16 g, Rfl: 11 .  hydrochlorothiazide (MICROZIDE) 12.5 MG capsule, TAKE 1-2 CAPSULES BY MOUTH ONCE DAILY, Disp: 180 capsule, Rfl: 3 .  ibuprofen (ADVIL,MOTRIN) 800 MG tablet, TAKE 1 TABLET (800 MG TOTAL) BY MOUTH EVERY EVENING., Disp: 90 tablet, Rfl: 1 .  loratadine (CLARITIN) 10 MG tablet, TAKE 1 TABLET (10 MG TOTAL) BY MOUTH DAILY. REPORTED ON 08/31/2015, Disp: 90 tablet, Rfl: 3 .  montelukast (SINGULAIR) 10 MG tablet, Take 1 tablet (10 mg total) by mouth daily., Disp: 90 tablet, Rfl: 3 .  benzonatate (TESSALON) 200 MG capsule, Take 1 capsule (200 mg total) by mouth 2 (two) times daily as needed for cough. (Patient not taking: Reported on 12/02/2017), Disp: 20 capsule, Rfl: 0 .  hydrOXYzine (ATARAX/VISTARIL) 10 MG tablet, Take 1 tablet (10 mg total) by mouth 3 (three) times daily as needed. (Patient not taking: Reported on 07/07/2018), Disp: 30 tablet, Rfl: 0 .  predniSONE (DELTASONE) 10 MG tablet, Take 6 tabs PO on day 1&2, 5 tabs PO on day 3&4, 4 tabs PO on  day 5&6, 3 tabs PO on day 7&8, 2 tabs PO on day 9&10, 1 tab PO on day 11&12. (Patient not taking: Reported on 07/07/2018), Disp: 42 tablet, Rfl: 0   Patient Care Team: Mar Daring, PA-C as PCP - General (Family Medicine)      Objective:   Vitals: BP 107/74 (BP Location: Left Arm,  Patient Position: Sitting, Cuff Size: Large)   Pulse 69   Temp 97.7 F (36.5 C) (Oral)   Resp 16   Ht 5\' 2"  (1.575 m)   Wt 217 lb (98.4 kg)   SpO2 97%   BMI 39.69 kg/m    Vitals:   07/07/18 0830  BP: 107/74  Pulse: 69  Resp: 16  Temp: 97.7 F (36.5 C)  TempSrc: Oral  SpO2: 97%  Weight: 217 lb (98.4 kg)  Height: 5\' 2"  (1.575 m)     Physical Exam Vitals signs reviewed. Exam conducted with a chaperone present.  Constitutional:      General: She is not in acute distress.    Appearance: She is well-developed. She is not diaphoretic.  HENT:     Head: Normocephalic and atraumatic.     Right Ear: Hearing, tympanic membrane, ear canal and external ear normal.     Left Ear: Hearing, tympanic membrane, ear canal and external ear normal.     Nose: Nose normal.     Mouth/Throat:     Pharynx: Uvula midline. No oropharyngeal exudate.  Eyes:     General: No scleral icterus.       Right eye: No discharge.        Left eye: No discharge.     Conjunctiva/sclera: Conjunctivae normal.     Pupils: Pupils are equal, round, and reactive to light.  Neck:     Musculoskeletal: Normal range of motion and neck supple.     Thyroid: No thyromegaly.     Vascular: No carotid bruit or JVD.     Trachea: No tracheal deviation.  Cardiovascular:     Rate and Rhythm: Normal rate and regular rhythm.     Heart sounds: Murmur present. No friction rub. No gallop.   Pulmonary:     Effort: Pulmonary effort is normal. No respiratory distress.     Breath sounds: Normal breath sounds. No wheezing or rales.  Chest:     Chest wall: No tenderness.     Breasts: Breasts are symmetrical.        Right: No inverted nipple,  mass, nipple discharge, skin change or tenderness.        Left: No inverted nipple, mass, nipple discharge, skin change or tenderness.  Abdominal:     General: Bowel sounds are normal. There is no distension.     Palpations: Abdomen is soft. There is no mass.     Tenderness: There is no abdominal tenderness. There is no guarding or rebound.     Hernia: There is no hernia in the right inguinal area or left inguinal area.  Genitourinary:    Exam position: Supine.     Labia:        Right: No rash, tenderness, lesion or injury.        Left: No rash, tenderness, lesion or injury.      Vagina: Normal. No signs of injury. No vaginal discharge, erythema, tenderness or bleeding.     Cervix: No cervical motion tenderness, discharge or friability.     Adnexa:        Right: No mass, tenderness or fullness.         Left: No mass, tenderness or fullness.       Rectum: Normal.  Musculoskeletal: Normal range of motion.        General: No tenderness.  Lymphadenopathy:     Cervical: No cervical adenopathy.  Skin:    General: Skin is warm and dry.  Findings: No rash.  Neurological:     Mental Status: She is alert and oriented to person, place, and time.     Cranial Nerves: No cranial nerve deficit.     Coordination: Coordination normal.     Deep Tendon Reflexes: Reflexes are normal and symmetric.  Psychiatric:        Behavior: Behavior normal.        Thought Content: Thought content normal.        Judgment: Judgment normal.      Depression Screen PHQ 2/9 Scores 07/07/2018 05/20/2017 05/15/2016  PHQ - 2 Score 0 0 0  PHQ- 9 Score 1 0 -      Assessment & Plan:     Routine Health Maintenance and Physical Exam  Exercise Activities and Dietary recommendations Goals   None     Immunization History  Administered Date(s) Administered  . Influenza,inj,Quad PF,6+ Mos 05/20/2017, 05/26/2018  . Pneumococcal Polysaccharide-23 04/06/2014  . Td 05/28/2002  . Tdap 11/09/2009  . Zoster  12/31/2013    Health Maintenance  Topic Date Due  . PAP SMEAR-Modifier  03/14/2015  . COLONOSCOPY  07/13/2017  . MAMMOGRAM  04/03/2018  . TETANUS/TDAP  11/10/2019  . INFLUENZA VACCINE  Completed  . Hepatitis C Screening  Completed  . HIV Screening  Completed     Discussed health benefits of physical activity, and encouraged her to engage in regular exercise appropriate for her age and condition.    1. Annual physical exam Normal physical exam today. Will check labs as below and f/u pending lab results. If labs are stable and WNL she will not need to have these rechecked for one year at her next annual physical exam. She is to call the office in the meantime if she has any acute issue, questions or concerns. - CBC w/Diff/Platelet - Comprehensive Metabolic Panel (CMET) - TSH - Lipid Profile - HgB A1c  2. Cervical cancer screening Pap collected today. Will send as below and f/u pending results. - Cytology - PAP  3. Breast cancer screening Patient refuses.  4. Colon cancer screening Patient refuses.   5. Avitaminosis D H/O this and postmenopausal. Will check labs as below and f/u pending results. - CBC w/Diff/Platelet - Vitamin D (25 hydroxy)  6. Hypercholesterolemia without hypertriglyceridemia Diet controlled. Will check labs as below and f/u pending results. - CBC w/Diff/Platelet - Comprehensive Metabolic Panel (CMET) - Lipid Profile - HgB A1c  7. Tobacco use Declines smoking cessation.  - CBC w/Diff/Platelet - Comprehensive Metabolic Panel (CMET) - TSH  8. Alcohol use Drinking 3 beers per night. Declines wanting to quit.  - CBC w/Diff/Platelet - Comprehensive Metabolic Panel (CMET) - TSH - Lipid Profile - HgB A1c  9. Class 2 severe obesity due to excess calories with serious comorbidity and body mass index (BMI) of 39.0 to 39.9 in adult [facility]) Counseled patient on healthy lifestyle modifications including dieting and exercise.  - CBC w/Diff/Platelet -  Comprehensive Metabolic Panel (CMET) - Lipid Profile - HgB A1c  10. Pruritus Still having lower extremity itching. Suspect secondary to venous stasis dermatitis from venous insufficiency. Discussed keeping legs well hydrated by using a thick lotion/body cream. Hydroxyzine given as below for itching.  - hydrOXYzine (ATARAX/VISTARIL) 10 MG tablet; Take 1 tablet (10 mg total) by mouth 3 (three) times daily as needed.  Dispense: 30 tablet; Refill: 0 - CBC w/Diff/Platelet - Comprehensive Metabolic Panel (CMET)  --------------------------------------------------------------------    Mar Daring, PA-C  [facility]

## 2018-07-07 ENCOUNTER — Encounter: Payer: Self-pay | Admitting: Physician Assistant

## 2018-07-07 ENCOUNTER — Other Ambulatory Visit (HOSPITAL_COMMUNITY)
Admission: RE | Admit: 2018-07-07 | Discharge: 2018-07-07 | Disposition: A | Discharge status: Home / Self Care | Payer: BLUE CROSS/BLUE SHIELD | Source: Ambulatory Visit | Attending: Physician Assistant | Admitting: Physician Assistant

## 2018-07-07 ENCOUNTER — Ambulatory Visit: Payer: Self-pay | Admitting: Physician Assistant

## 2018-07-07 VITALS — BP 107/74 | HR 69 | Temp 97.7°F | Resp 16 | Ht 62.0 in | Wt 217.0 lb

## 2018-07-07 DIAGNOSIS — E7800 Pure hypercholesterolemia, unspecified: Secondary | ICD-10-CM

## 2018-07-07 DIAGNOSIS — E559 Vitamin D deficiency, unspecified: Secondary | ICD-10-CM

## 2018-07-07 DIAGNOSIS — Z1239 Encounter for other screening for malignant neoplasm of breast: Secondary | ICD-10-CM

## 2018-07-07 DIAGNOSIS — Z7289 Other problems related to lifestyle: Secondary | ICD-10-CM

## 2018-07-07 DIAGNOSIS — Z124 Encounter for screening for malignant neoplasm of cervix: Secondary | ICD-10-CM | POA: Insufficient documentation

## 2018-07-07 DIAGNOSIS — L299 Pruritus, unspecified: Secondary | ICD-10-CM

## 2018-07-07 DIAGNOSIS — Z720 Tobacco use: Secondary | ICD-10-CM

## 2018-07-07 DIAGNOSIS — Z1211 Encounter for screening for malignant neoplasm of colon: Secondary | ICD-10-CM

## 2018-07-07 DIAGNOSIS — Z6839 Body mass index (BMI) 39.0-39.9, adult: Secondary | ICD-10-CM

## 2018-07-07 DIAGNOSIS — Z0000 Encounter for general adult medical examination without abnormal findings: Secondary | ICD-10-CM

## 2018-07-07 DIAGNOSIS — Z789 Other specified health status: Secondary | ICD-10-CM | POA: Insufficient documentation

## 2018-07-07 MED ORDER — HYDROXYZINE HCL 10 MG PO TABS: 10.0000 mg | ORAL_TABLET | Freq: Three times a day (TID) | ORAL | 0 refills | Status: DC | PRN

## 2018-07-07 NOTE — Patient Instructions (Signed)
Health Maintenance for Postmenopausal Women Menopause is a normal process in which your reproductive ability comes to an end. This process happens gradually over a span of months to years, usually between the ages of 62 and 89. Menopause is complete when you have missed 12 consecutive menstrual periods. It is important to talk with your health care provider about some of the most common conditions that affect postmenopausal women, such as heart disease, cancer, and bone loss (osteoporosis). Adopting a healthy lifestyle and getting preventive care can help to promote your health and wellness. Those actions can also lower your chances of developing some of these common conditions. What should I know about menopause? During menopause, you may experience a number of symptoms, such as:  Moderate-to-severe hot flashes.  Night sweats.  Decrease in sex drive.  Mood swings.  Headaches.  Tiredness.  Irritability.  Memory problems.  Insomnia. Choosing to treat or not to treat menopausal changes is an individual decision that you make with your health care provider. What should I know about hormone replacement therapy and supplements? Hormone therapy products are effective for treating symptoms that are associated with menopause, such as hot flashes and night sweats. Hormone replacement carries certain risks, especially as you become older. If you are thinking about using estrogen or estrogen with progestin treatments, discuss the benefits and risks with your health care provider. What should I know about heart disease and stroke? Heart disease, heart attack, and stroke become more likely as you age. This may be due, in part, to the hormonal changes that your body experiences during menopause. These can affect how your body processes dietary fats, triglycerides, and cholesterol. Heart attack and stroke are both medical emergencies. There are many things that you can do to help prevent heart disease  and stroke:  Have your blood pressure checked at least every 1-2 years. High blood pressure causes heart disease and increases the risk of stroke.  If you are 79-72 years old, ask your health care provider if you should take aspirin to prevent a heart attack or a stroke.  Do not use any tobacco products, including cigarettes, chewing tobacco, or electronic cigarettes. If you need help quitting, ask your health care provider.  It is important to eat a healthy diet and maintain a healthy weight. ? Be sure to include plenty of vegetables, fruits, low-fat dairy products, and lean protein. ? Avoid eating foods that are high in solid fats, added sugars, or salt (sodium).  Get regular exercise. This is one of the most important things that you can do for your health. ? Try to exercise for at least 150 minutes each week. The type of exercise that you do should increase your heart rate and make you sweat. This is known as moderate-intensity exercise. ? Try to do strengthening exercises at least twice each week. Do these in addition to the moderate-intensity exercise.  Know your numbers.Ask your health care provider to check your cholesterol and your blood glucose. Continue to have your blood tested as directed by your health care provider.  What should I know about cancer screening? There are several types of cancer. Take the following steps to reduce your risk and to catch any cancer development as early as possible. Breast Cancer  Practice breast self-awareness. ? This means understanding how your breasts normally appear and feel. ? It also means doing regular breast self-exams. Let your health care provider know about any changes, no matter how small.  If you are 40 or  older, have a clinician do a breast exam (clinical breast exam or CBE) every year. Depending on your age, family history, and medical history, it may be recommended that you also have a yearly breast X-ray (mammogram).  If you  have a family history of breast cancer, talk with your health care provider about genetic screening.  If you are at high risk for breast cancer, talk with your health care provider about having an MRI and a mammogram every year.  Breast cancer (BRCA) gene test is recommended for women who have family members with BRCA-related cancers. Results of the assessment will determine the need for genetic counseling and BRCA1 and for BRCA2 testing. BRCA-related cancers include these types: ? Breast. This occurs in males or females. ? Ovarian. ? Tubal. This may also be called fallopian tube cancer. ? Cancer of the abdominal or pelvic lining (peritoneal cancer). ? Prostate. ? Pancreatic. Cervical, Uterine, and Ovarian Cancer Your health care provider may recommend that you be screened regularly for cancer of the pelvic organs. These include your ovaries, uterus, and vagina. This screening involves a pelvic exam, which includes checking for microscopic changes to the surface of your cervix (Pap test).  For women ages 21-65, health care providers may recommend a pelvic exam and a Pap test every three years. For women ages 39-65, they may recommend the Pap test and pelvic exam, combined with testing for human papilloma virus (HPV), every five years. Some types of HPV increase your risk of cervical cancer. Testing for HPV may also be done on women of any age who have unclear Pap test results.  Other health care providers may not recommend any screening for nonpregnant women who are considered low risk for pelvic cancer and have no symptoms. Ask your health care provider if a screening pelvic exam is right for you.  If you have had past treatment for cervical cancer or a condition that could lead to cancer, you need Pap tests and screening for cancer for at least 20 years after your treatment. If Pap tests have been discontinued for you, your risk factors (such as having a new sexual partner) need to be reassessed  to determine if you should start having screenings again. Some women have medical problems that increase the chance of getting cervical cancer. In these cases, your health care provider may recommend that you have screening and Pap tests more often.  If you have a family history of uterine cancer or ovarian cancer, talk with your health care provider about genetic screening.  If you have vaginal bleeding after reaching menopause, tell your health care provider.  There are currently no reliable tests available to screen for ovarian cancer. Lung Cancer Lung cancer screening is recommended for adults 57-50 years old who are at high risk for lung cancer because of a history of smoking. A yearly low-dose CT scan of the lungs is recommended if you:  Currently smoke.  Have a history of at least 30 pack-years of smoking and you currently smoke or have quit within the past 15 years. A pack-year is smoking an average of one pack of cigarettes per day for one year. Yearly screening should:  Continue until it has been 15 years since you quit.  Stop if you develop a health problem that would prevent you from having lung cancer treatment. Colorectal Cancer  This type of cancer can be detected and can often be prevented.  Routine colorectal cancer screening usually begins at age 12 and continues through  age 75.  If you have risk factors for colon cancer, your health care provider may recommend that you be screened at an earlier age.  If you have a family history of colorectal cancer, talk with your health care provider about genetic screening.  Your health care provider may also recommend using home test kits to check for hidden blood in your stool.  A small camera at the end of a tube can be used to examine your colon directly (sigmoidoscopy or colonoscopy). This is done to check for the earliest forms of colorectal cancer.  Direct examination of the colon should be repeated every 5-10 years until  age 75. However, if early forms of precancerous polyps or small growths are found or if you have a family history or genetic risk for colorectal cancer, you may need to be screened more often. Skin Cancer  Check your skin from head to toe regularly.  Monitor any moles. Be sure to tell your health care provider: ? About any new moles or changes in moles, especially if there is a change in a mole's shape or color. ? If you have a mole that is larger than the size of a pencil eraser.  If any of your family members has a history of skin cancer, especially at a young age, talk with your health care provider about genetic screening.  Always use sunscreen. Apply sunscreen liberally and repeatedly throughout the day.  Whenever you are outside, protect yourself by wearing long sleeves, pants, a wide-brimmed hat, and sunglasses. What should I know about osteoporosis? Osteoporosis is a condition in which bone destruction happens more quickly than new bone creation. After menopause, you may be at an increased risk for osteoporosis. To help prevent osteoporosis or the bone fractures that can happen because of osteoporosis, the following is recommended:  If you are 19-50 years old, get at least 1,000 mg of calcium and at least 600 mg of vitamin D per day.  If you are older than age 50 but younger than age 70, get at least 1,200 mg of calcium and at least 600 mg of vitamin D per day.  If you are older than age 70, get at least 1,200 mg of calcium and at least 800 mg of vitamin D per day. Smoking and excessive alcohol intake increase the risk of osteoporosis. Eat foods that are rich in calcium and vitamin D, and do weight-bearing exercises several times each week as directed by your health care provider. What should I know about how menopause affects my mental health? Depression may occur at any age, but it is more common as you become older. Common symptoms of depression include:  Low or sad  mood.  Changes in sleep patterns.  Changes in appetite or eating patterns.  Feeling an overall lack of motivation or enjoyment of activities that you previously enjoyed.  Frequent crying spells. Talk with your health care provider if you think that you are experiencing depression. What should I know about immunizations? It is important that you get and maintain your immunizations. These include:  Tetanus, diphtheria, and pertussis (Tdap) booster vaccine.  Influenza every year before the flu season begins.  Pneumonia vaccine.  Shingles vaccine. Your health care provider may also recommend other immunizations. This information is not intended to replace advice given to you by your health care provider. Make sure you discuss any questions you have with your health care provider. Document Released: 07/27/2005 Document Revised: 12/23/2015 Document Reviewed: 03/08/2015 Elsevier Interactive Patient Education    2019 [place].

## 2018-07-08 LAB — COMPREHENSIVE METABOLIC PANEL
ALBUMIN: 4.5 g/dL (ref 3.8–4.8)
ALK PHOS: 82 IU/L (ref 39–117)
ALT: 14 IU/L (ref 0–32)
AST: 17 IU/L (ref 0–40)
Albumin/Globulin Ratio: 1.6 (ref 1.2–2.2)
BUN/Creatinine Ratio: 27 (ref 12–28)
BUN: 16 mg/dL (ref 8–27)
Bilirubin Total: 0.3 mg/dL (ref 0.0–1.2)
CO2: 25 mmol/L (ref 20–29)
CREATININE: 0.59 mg/dL (ref 0.57–1.00)
Calcium: 10.2 mg/dL (ref 8.7–10.3)
Chloride: 99 mmol/L (ref 96–106)
GFR calc Af Amer: 112 mL/min/{1.73_m2} (ref >59)
GFR calc non Af Amer: 97 mL/min/{1.73_m2} (ref >59)
Globulin, Total: 2.9 g/dL (ref 1.5–4.5)
Glucose: 86 mg/dL (ref 65–99)
Potassium: 4.3 mmol/L (ref 3.5–5.2)
Sodium: 139 mmol/L (ref 134–144)
Total Protein: 7.4 g/dL (ref 6.0–8.5)

## 2018-07-08 LAB — CBC WITH DIFFERENTIAL/PLATELET
Basophils Absolute: 0.1 10*3/uL (ref 0.0–0.2)
Basos: 1 %
EOS (ABSOLUTE): 0.2 10*3/uL (ref 0.0–0.4)
Eos: 4 %
Hematocrit: 36.5 % (ref 34.0–46.6)
Hemoglobin: 12.6 g/dL (ref 11.1–15.9)
Immature Grans (Abs): 0 10*3/uL (ref 0.0–0.1)
Immature Granulocytes: 0 %
Lymphocytes Absolute: 1.3 10*3/uL (ref 0.7–3.1)
Lymphs: 29 %
MCH: 31.3 pg (ref 26.6–33.0)
MCHC: 34.5 g/dL (ref 31.5–35.7)
MCV: 91 fL (ref 79–97)
MONOCYTES: 12 %
Monocytes Absolute: 0.5 10*3/uL (ref 0.1–0.9)
Neutrophils Absolute: 2.4 10*3/uL (ref 1.4–7.0)
Neutrophils: 54 %
Platelets: 377 10*3/uL (ref 150–450)
RBC: 4.02 x10E6/uL (ref 3.77–5.28)
RDW: 12.8 % (ref 11.7–15.4)
WBC: 4.5 10*3/uL (ref 3.4–10.8)

## 2018-07-08 LAB — LIPID PANEL
Chol/HDL Ratio: 4.4 ratio (ref 0.0–4.4)
Cholesterol, Total: 267 mg/dL — ABNORMAL HIGH (ref 100–199)
HDL: 61 mg/dL (ref >39)
LDL Calculated: 179 mg/dL — ABNORMAL HIGH (ref 0–99)
Triglycerides: 137 mg/dL (ref 0–149)
VLDL Cholesterol Cal: 27 mg/dL (ref 5–40)

## 2018-07-08 LAB — HEMOGLOBIN A1C
Est. average glucose Bld gHb Est-mCnc: 105 mg/dL
Hgb A1c MFr Bld: 5.3 % (ref 4.8–5.6)

## 2018-07-08 LAB — TSH: TSH: 1.18 u[IU]/mL (ref 0.450–4.500)

## 2018-07-08 LAB — VITAMIN D 25 HYDROXY (VIT D DEFICIENCY, FRACTURES): Vit D, 25-Hydroxy: 26.9 ng/mL — ABNORMAL LOW (ref 30.0–100.0)

## 2018-07-09 ENCOUNTER — Telehealth: Payer: Self-pay

## 2018-07-09 DIAGNOSIS — E7800 Pure hypercholesterolemia, unspecified: Secondary | ICD-10-CM

## 2018-07-09 MED ORDER — ATORVASTATIN CALCIUM 20 MG PO TABS: 20.0000 mg | ORAL_TABLET | Freq: Every day | ORAL | 3 refills | Status: DC

## 2018-07-09 NOTE — Telephone Encounter (Signed)
Patient advised as directed below. Patient wants to go ahead with the lowering medication as long as it is not expensive.

## 2018-07-09 NOTE — Telephone Encounter (Signed)
No answer

## 2018-07-09 NOTE — Telephone Encounter (Signed)
-----   Message from Mar Daring, [place] sent at 07/09/2018  7:40 AM EST ----- All labs are normal and stable with exception of cholesterol which has increased quite a bit since last year. At your cholesterol current levels and since you are still smoking your risk of having a cardiovascular event over the next 10 years is high at 11.4%. I would recommend a cholesterol lowering medication. You would also benefit from healthy dieting habits such as low fat, low sugar and decrease red meat consumption. Increasing physical activity as tolerated will benefit as well. Also quitting smoking will lower your risk drastically. Vit D is also low. Recommend starting an OTC Vit D supplement of 1000 IU daily.

## 2018-07-09 NOTE — Telephone Encounter (Signed)
Sent in atorvastatin for her.

## 2018-07-10 LAB — CYTOLOGY - PAP
Adequacy: ABSENT
Diagnosis: NEGATIVE
HPV: NOT DETECTED

## 2018-07-11 ENCOUNTER — Telehealth: Payer: Self-pay | Admitting: Physician Assistant

## 2018-07-11 ENCOUNTER — Telehealth: Payer: Self-pay | Admitting: *Deleted

## 2018-07-11 NOTE — Telephone Encounter (Signed)
Done

## 2018-07-11 NOTE — Telephone Encounter (Signed)
Patient was advised of pap results but not lab results. No answer and no vm. Will try again later.

## 2018-07-11 NOTE — Telephone Encounter (Signed)
-----   Message from Mar Daring, [place] sent at 07/11/2018 10:24 AM EST ----- Pap is normal, HPV negative.  Will repeat in future only if patient desires or if issues arise.

## 2018-07-11 NOTE — Telephone Encounter (Signed)
Patient was advised of all results. Expressed understanding.

## 2018-07-11 NOTE — Telephone Encounter (Signed)
Patient is returning a call to [place]. Please call her back

## 2018-08-27 ENCOUNTER — Other Ambulatory Visit: Payer: Self-pay | Admitting: Physician Assistant

## 2018-08-27 ENCOUNTER — Telehealth: Payer: Self-pay | Admitting: Physician Assistant

## 2018-08-27 DIAGNOSIS — L299 Pruritus, unspecified: Secondary | ICD-10-CM

## 2018-08-27 MED ORDER — HYDROXYZINE HCL 10 MG PO TABS: 10.0000 mg | ORAL_TABLET | Freq: Three times a day (TID) | ORAL | 1 refills | Status: DC | PRN

## 2018-08-27 NOTE — Telephone Encounter (Signed)
Patient is requesting refill on Hydroxyzine to be called to [facility] in [facility] for itching

## 2018-09-21 ENCOUNTER — Other Ambulatory Visit: Payer: Self-pay | Admitting: Physician Assistant

## 2018-09-21 DIAGNOSIS — L299 Pruritus, unspecified: Secondary | ICD-10-CM

## 2018-11-12 ENCOUNTER — Other Ambulatory Visit: Payer: Self-pay | Admitting: Physician Assistant

## 2018-11-12 DIAGNOSIS — M1710 Unilateral primary osteoarthritis, unspecified knee: Secondary | ICD-10-CM

## 2018-11-23 ENCOUNTER — Other Ambulatory Visit: Payer: Self-pay | Admitting: Physician Assistant

## 2018-11-23 DIAGNOSIS — L299 Pruritus, unspecified: Secondary | ICD-10-CM

## 2018-12-07 ENCOUNTER — Emergency Department: Payer: Self-pay

## 2018-12-07 ENCOUNTER — Encounter: Payer: Self-pay | Admitting: Emergency Medicine

## 2018-12-07 ENCOUNTER — Other Ambulatory Visit: Payer: Self-pay

## 2018-12-07 ENCOUNTER — Emergency Department
Admission: EM | Admit: 2018-12-07 | Discharge: 2018-12-07 | Disposition: A | Discharge status: Home / Self Care | Payer: Self-pay | Attending: Emergency Medicine | Admitting: Emergency Medicine

## 2018-12-07 DIAGNOSIS — R0789 Other chest pain: Secondary | ICD-10-CM

## 2018-12-07 DIAGNOSIS — I10 Essential (primary) hypertension: Secondary | ICD-10-CM | POA: Insufficient documentation

## 2018-12-07 DIAGNOSIS — Z7982 Long term (current) use of aspirin: Secondary | ICD-10-CM | POA: Insufficient documentation

## 2018-12-07 DIAGNOSIS — R079 Chest pain, unspecified: Secondary | ICD-10-CM | POA: Insufficient documentation

## 2018-12-07 DIAGNOSIS — F17210 Nicotine dependence, cigarettes, uncomplicated: Secondary | ICD-10-CM | POA: Insufficient documentation

## 2018-12-07 LAB — BASIC METABOLIC PANEL
Anion gap: 9 (ref 5–15)
BUN: 17 mg/dL (ref 8–23)
CO2: 27 mmol/L (ref 22–32)
Calcium: 9.6 mg/dL (ref 8.9–10.3)
Chloride: 104 mmol/L (ref 98–111)
Creatinine, Ser: 0.8 mg/dL (ref 0.44–1.00)
GFR calc Af Amer: >60 mL/min (ref >60)
GFR calc non Af Amer: >60 mL/min (ref >60)
Glucose, Bld: 121 mg/dL — ABNORMAL HIGH (ref 70–99)
Potassium: 3.4 mmol/L — ABNORMAL LOW (ref 3.5–5.1)
Sodium: 140 mmol/L (ref 135–145)

## 2018-12-07 LAB — CBC
HCT: 35.6 % — ABNORMAL LOW (ref 36.0–46.0)
Hemoglobin: 11.8 g/dL — ABNORMAL LOW (ref 12.0–15.0)
MCH: 29.9 pg (ref 26.0–34.0)
MCHC: 33.1 g/dL (ref 30.0–36.0)
MCV: 90.4 fL (ref 80.0–100.0)
Platelets: 373 10*3/uL (ref 150–400)
RBC: 3.94 MIL/uL (ref 3.87–5.11)
RDW: 14.2 % (ref 11.5–15.5)
WBC: 8.7 10*3/uL (ref 4.0–10.5)
nRBC: 0 % (ref 0.0–0.2)

## 2018-12-07 LAB — TROPONIN I
Troponin I: <0.03 ng/mL (ref <0.03)
Troponin I: <0.03 ng/mL (ref <0.03)

## 2018-12-07 LAB — FIBRIN DERIVATIVES D-DIMER (ARMC ONLY): Fibrin derivatives D-dimer (ARMC): 807.4 ng/mL (FEU) — ABNORMAL HIGH (ref 0.00–499.00)

## 2018-12-07 MED ORDER — ACETAMINOPHEN 325 MG PO TABS
650.0000 mg | ORAL_TABLET | Freq: Once | ORAL | Status: AC
  Administered 2018-12-07: 650 mg via ORAL
  Filled 2018-12-07: qty 2

## 2018-12-07 MED ORDER — IOHEXOL 350 MG/ML SOLN
75.0000 mL | Freq: Once | INTRAVENOUS | Status: AC | PRN
  Administered 2018-12-07: 75 mL via INTRAVENOUS

## 2018-12-07 MED ORDER — SODIUM CHLORIDE 0.9% FLUSH: 3.0000 mL | Freq: Once | INTRAVENOUS | Status: DC

## 2018-12-07 NOTE — ED Provider Notes (Addendum)
[facility] Emergency Department Provider Note  ____________________________________________   I have reviewed the triage vital signs and the nursing notes. Where available I have reviewed prior notes and, if possible and indicated, outside hospital notes.    HISTORY  Chief Complaint Chest Pain    HPI Cathy Townsend is a 63 y.o. female patient seen and evaluated during the coronavirus epidemic during a time with low staffing, presents today complaining of "an ache in my chest wall".  She has chest wall pain on the left side near the ribs.  Does not remember how she injured it she states.  Hurts when she touches it or changes position or lifts her arm up.  It is fine when she is at rest.  No exertional symptoms.  It only hurts when she changes position the wrong way.  Some sharp pain.  Nonradiating.  No pleuritic component no shortness of breath, no cough no personal or family history of PE or DVT no recent travel denies exogenous estrogens.  No leg swelling.  Pain is a sharp discomfort.  Take been constant all day long as long as he moves the wrong way and there is a slight low level discomfort otherwise which is exacerbated by motion.  No fever.  No cough.  Denies any 3 of CAD.  She has not had any exertional symptoms.  She took ibuprofen this morning, the pain seemed to get better but then came back after it wore off.  Feels like she bumped her chest.    Past Medical History:  Diagnosis Date  . Allergy   . Arthritis    "think I have it in both knees" (05/06/2012)  . Hypertension   . PONV (postoperative nausea and vomiting)     Patient Active Problem List   Diagnosis Date Noted  . Alcohol use 07/07/2018  . Primary osteoarthritis of knee 02/10/2015  . Allergic rhinitis 02/08/2015  . Edema 02/08/2015  . Cardiac murmur 01/24/2015  . 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 Avitaminosis D 11/09/2009  . Adiposity 02/17/2008  . Tobacco use 02/17/2008  .  Hypercholesterolemia without hypertriglyceridemia 05/28/2002  . 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 COLONOSCOPY WITH PROPOFOL N/A 07/13/2016   Procedure: COLONOSCOPY WITH PROPOFOL;  Surgeon: Jonathon Bellows, MD;  Location: [facility] ENDOSCOPY;  Service: Endoscopy;  Laterality: N/A;  . DILATION AND CURETTAGE OF UTERUS    . JOINT REPLACEMENT     right knee  . REPLACEMENT TOTAL KNEE  ~ 2008   right  . TOTAL KNEE ARTHROPLASTY Left 02/10/2015   Procedure: TOTAL KNEE ARTHROPLASTY;  Surgeon: Hessie Knows, MD;  Location: [facility] ORS;  Service: Orthopedics;  Laterality: Left;  . TOTAL KNEE REVISION  05/05/2012   Procedure: TOTAL KNEE REVISION;  Surgeon: Kerin Salen, MD;  Location: [facility];  Service: Orthopedics;  Laterality: Right;  . TOTAL THYROIDECTOMY  1990's  . TUBAL LIGATION      Prior to Admission medications   Medication Sig Start Date End Date Taking? Authorizing Provider  aspirin EC 81 MG tablet Take 81 mg by mouth.    [provider]  atorvastatin (LIPITOR) 20 MG tablet Take 1 tablet (20 mg total) by mouth daily. 07/09/18   Mar Daring, PA-C  ferrous sulfate 325 (65 FE) MG tablet TAKE 1 TABLET BY MOUTH EVERY DAY WITH BREAKFAST 05/30/18   Fenton Malling M, PA-C  fluticasone (FLONASE) 50 MCG/ACT nasal spray Place  2 sprays into both nostrils daily. 05/20/17   Mar Daring, PA-C  hydrochlorothiazide (MICROZIDE) 12.5 MG capsule TAKE 1-2 CAPSULES BY MOUTH ONCE DAILY 03/11/18   Mar Daring, PA-C  hydrOXYzine (ATARAX/VISTARIL) 10 MG tablet TAKE 1 TABLET BY MOUTH THREE TIMES A DAY AS NEEDED 11/24/18   Mar Daring, PA-C  ibuprofen (ADVIL) 800 MG tablet TAKE 1 TABLET (800 MG TOTAL) BY MOUTH EVERY EVENING. 11/12/18   Mar Daring, PA-C  loratadine (CLARITIN) 10 MG tablet TAKE 1 TABLET (10 MG TOTAL) BY MOUTH DAILY. REPORTED ON 08/31/2015 04/12/17   Mar Daring, PA-C  montelukast (SINGULAIR) 10 MG  tablet Take 1 tablet (10 mg total) by mouth daily. 05/30/18   Mar Daring, PA-C    Allergies Oxycodone-acetaminophen, Tramadol, and Meloxicam  Family History  Problem Relation Age of Onset  . CAD Mother   . Heart disease Mother   . Heart attack Brother     Social History Social History   Tobacco Use  . Smoking status: Current Every Day Smoker    Packs/day: 0.50    Years: 40.00    Pack years: 20.00    Types: Cigarettes  . Smokeless tobacco: Never Used  Substance Use Topics  . Alcohol use: Yes    Alcohol/week: 12.0 standard drinks    Types: 12 Cans of beer per week    Comment: 05/06/2012 "3 cans of beer @ a time; more than that sometimes on weekends when I'm not working"  . Drug use: No    Review of Systems Constitutional: No fever/chills Eyes: No visual changes. ENT: No sore throat. No stiff neck no neck pain Cardiovascular: + chest pain. Respiratory: no shortness of breath. Gastrointestinal:   no vomiting.  No diarrhea.  No constipation. Genitourinary: Negative for dysuria. Musculoskeletal: Negative lower extremity swelling Skin: Negative for rash. Neurological: Negative for severe headaches, focal weakness or numbness.   ____________________________________________   PHYSICAL EXAM:  VITAL SIGNS: ED Triage Vitals  Enc Vitals Group     BP 12/07/18 1654 124/77     Pulse Rate 12/07/18 1654 86     Resp 12/07/18 1654 16     Temp 12/07/18 1654 99.8 F (37.7 C)     Temp Source 12/07/18 1654 Oral     SpO2 12/07/18 1654 98 %     Weight 12/07/18 1652 218 lb (98.9 kg)     Height 12/07/18 1652 5\' 2"  (1.575 m)     Head Circumference --      Peak Flow --      Pain Score 12/07/18 1652 6     Pain Loc --      Pain Edu? --      Excl. in [place]? --     Constitutional: Alert and oriented. Well appearing and in no acute distress. Eyes: Conjunctivae are normal Head: Atraumatic HEENT: No congestion/rhinnorhea. Mucous membranes are moist.  Oropharynx  non-erythematous Neck:   Nontender with no meningismus, no masses, no stridor Cardiovascular: Normal rate, regular rhythm. Grossly normal heart sounds.  Good peripheral circulation. Respiratory: Normal respiratory effort.  No retractions. Lungs CTAB. Chest: Tender palpation of the left chest wall around the ribs, no crepitus no flail chest no rib fracture noted, when I touch this area patient states "ouch that the pain right there" and pulls back.  There is no rash or shingles there is no inflammation there is no erythema it is not hot to touch, no induration or swelling, also elicitable when she lifts up  her arms or twists in the bed or pulls up on the bed all of these things make the pain in her chest wall worse. Abdominal: Soft and nontender. No distention. No guarding no rebound Back:  There is no focal tenderness or step off.  there is no midline tenderness there are no lesions noted. there is no CVA tenderness Musculoskeletal: No lower extremity tenderness, no upper extremity tenderness. No joint effusions, no DVT signs strong distal pulses no edema Neurologic:  Normal speech and language. No gross focal neurologic deficits are appreciated.  Skin:  Skin is warm, dry and intact. No rash noted. Psychiatric: Mood and affect are normal. Speech and behavior are normal.  ____________________________________________   LABS (all labs ordered are listed, but only abnormal results are displayed)  Labs Reviewed  BASIC METABOLIC PANEL - Abnormal; Notable for the following components:      Result Value   Potassium 3.4 (*)    Glucose, Bld 121 (*)    All other components within normal limits  CBC - Abnormal; Notable for the following components:   Hemoglobin 11.8 (*)    HCT 35.6 (*)    All other components within normal limits  TROPONIN I  FIBRIN DERIVATIVES D-DIMER ([facility] ONLY)  TROPONIN I    Pertinent labs  results that were available during my care of the patient were reviewed by me and  considered in my medical decision making (see chart for details). ____________________________________________  EKG  I personally interpreted any EKGs ordered by me or triage Sinus rhythm rate 87 bpm no acute ST elevation or depression normal axis unremarkable EKG nonspecific ST changes noted ____________________________________________  RADIOLOGY  Pertinent labs & imaging results that were available during my care of the patient were reviewed by me and considered in my medical decision making (see chart for details). If possible, patient and/or family made aware of any abnormal findings.  Dg Chest 2 View  Result Date: 12/07/2018 CLINICAL DATA:  Chest pain EXAM: CHEST - 2 VIEW COMPARISON:  03/17/2013 FINDINGS: The heart size and mediastinal contours are within normal limits. Both lungs are clear. Degenerative changes of the spine. IMPRESSION: No active cardiopulmonary disease. Electronically Signed   By: Donavan Foil M.D.   On: 12/07/2018 19:21   ____________________________________________    PROCEDURES  Procedure(s) performed: None  Procedures  Critical Care performed: None  ____________________________________________   INITIAL IMPRESSION / ASSESSMENT AND PLAN / ED COURSE  Pertinent labs & imaging results that were available during my care of the patient were reviewed by me and considered in my medical decision making (see chart for details).  With very reproducible chest wall pain nonstop since yesterday, quite elicitable, nothing at this time states ACS PE dissection myocarditis endocarditis pneumonia pneumothorax, etc.  However we will send 2 sets of cardiac markers and we will obtain a d-dimer.  It is my hope that we get her safely home.  We are giving her pain medication.  Patient strong preference is discharged over admission given concern about the coronavirus.   ----------------------------------------- 10:14 PM on  12/07/2018 ----------------------------------------- Negative for PE, serial enzymes are negative, patient sitting in the chair demanding to be discharged she is already dressed.  She does not want to stay.  Risk-benefit and alternatives to discharge and admission understood by patient.  I did offer and advised admission because there are some EKG changes but she is adamant she will go home.  No evidence of ongoing ischemia, the Tylenol helped her great pain a  great deal.  Very low suspicion for ACS actually given clinical history CT scan does not betray thyroid issue which needs outpatient ultrasound she states she is already aware of this but I have made a point of telling her anyway she will follow with her primary care for that we will refer her to cardiology, return precautions follow-up given and understood.    ____________________________________________   FINAL CLINICAL IMPRESSION(S) / ED DIAGNOSES  Final diagnoses:  None      This chart was dictated using voice recognition software.  Despite best efforts to proofread,  errors can occur which can change meaning.      Schuyler Amor, MD 12/07/18 2010    Schuyler Amor, MD 12/07/18 2215

## 2018-12-07 NOTE — ED Triage Notes (Signed)
Pt to ED via POV c/o left sided chest pain. Pt states that the pain started last night but has gotten worse today. Pt states that she is now having pain every time she moves. Pt denies radiation of the pain. Pt denies any other symptoms at this time. Pt is in NAD.

## 2018-12-07 NOTE — Discharge Instructions (Signed)
It is not clear why you had pain in your chest.  You do not want to stay in the ER or the hospital.  At that is certainly your choice but does limit our ability to continue to watch you.  If you have any new or worrisome symptoms including worsening chest pain shortness of breath or you change your mind about admission please return to the ER.  Otherwise, please follow closely with primary care and cardiology as listed.  We also noticed normalities in your thyroid on the CT scan.  It is strongly advised that you get an outpatient ultrasound please let your primary care doctor know about this in the next few days.

## 2019-01-04 ENCOUNTER — Other Ambulatory Visit: Payer: Self-pay | Admitting: Physician Assistant

## 2019-01-04 DIAGNOSIS — R600 Localized edema: Secondary | ICD-10-CM

## 2019-01-06 ENCOUNTER — Other Ambulatory Visit: Payer: Self-pay | Admitting: Physician Assistant

## 2019-01-06 DIAGNOSIS — E611 Iron deficiency: Secondary | ICD-10-CM

## 2019-03-25 ENCOUNTER — Ambulatory Visit: Payer: Self-pay

## 2019-03-31 ENCOUNTER — Ambulatory Visit (INDEPENDENT_AMBULATORY_CARE_PROVIDER_SITE_OTHER): Payer: Medicare Other

## 2019-03-31 ENCOUNTER — Other Ambulatory Visit: Payer: Self-pay

## 2019-03-31 DIAGNOSIS — Z23 Encounter for immunization: Secondary | ICD-10-CM | POA: Diagnosis not present

## 2019-04-24 ENCOUNTER — Telehealth: Payer: Self-pay | Admitting: *Deleted

## 2019-04-24 DIAGNOSIS — L299 Pruritus, unspecified: Secondary | ICD-10-CM

## 2019-04-24 NOTE — Telephone Encounter (Signed)
Patient called office requesting a referral to Dermatology. Patient states hydroxyzine is not helping with Pruritus. Patient states she does not want to go to [facility] Dermatology or [facility] Dermatology. Patient is aware Tawanna Sat is out of the office until Monday. Please advise?

## 2019-04-27 NOTE — Telephone Encounter (Signed)
Patient wants to know if she could get referred to another dermatology because the one she was referred to is booked out until March,2021 and she can not wait that long. Patient states that her skin is irritated.Please advise.

## 2019-04-27 NOTE — Telephone Encounter (Signed)
New referral placed.

## 2019-05-01 ENCOUNTER — Other Ambulatory Visit: Payer: Self-pay | Admitting: Physician Assistant

## 2019-05-01 DIAGNOSIS — M1710 Unilateral primary osteoarthritis, unspecified knee: Secondary | ICD-10-CM

## 2019-05-18 ENCOUNTER — Other Ambulatory Visit: Payer: Self-pay | Admitting: Physician Assistant

## 2019-05-18 DIAGNOSIS — E611 Iron deficiency: Secondary | ICD-10-CM

## 2019-05-18 DIAGNOSIS — J309 Allergic rhinitis, unspecified: Secondary | ICD-10-CM

## 2019-06-29 ENCOUNTER — Other Ambulatory Visit: Payer: Self-pay

## 2019-06-29 ENCOUNTER — Ambulatory Visit (INDEPENDENT_AMBULATORY_CARE_PROVIDER_SITE_OTHER): Payer: Medicare Other | Admitting: Physician Assistant

## 2019-06-29 ENCOUNTER — Encounter: Payer: Self-pay | Admitting: Physician Assistant

## 2019-06-29 VITALS — BP 111/73 | HR 78 | Temp 98.9°F | Resp 16 | Wt 217.2 lb

## 2019-06-29 DIAGNOSIS — S76312A Strain of muscle, fascia and tendon of the posterior muscle group at thigh level, left thigh, initial encounter: Secondary | ICD-10-CM | POA: Diagnosis not present

## 2019-06-29 MED ORDER — METHYLPREDNISOLONE 4 MG PO TBPK: ORAL_TABLET | ORAL | 0 refills | Status: DC

## 2019-06-29 NOTE — Progress Notes (Signed)
[name] Female    DOB: [birth year]   64 y.o.   MRN: [id number] Visit Date: 06/29/2019  Today's Provider: Mar Daring, PA-C   No chief complaint on file.  Subjective:     HPI  Patient here with c/o pain left side under her butt cheek medial side. She notice this pain last Friday. No known injury. Pain is worsening. She has taken IBU 800 mg and is not helping. Radiates to groin area. Occasionally will radiate to the posterior thigh. When she is sitting she is ok putting pressure on it. Notices most going from sitting to standing. Also notices if she has to lift her left leg.    Allergies  Allergen Reactions  . Oxycodone-Acetaminophen Itching  . Tramadol Itching  . Meloxicam Rash     Current Outpatient Medications:  .  aspirin EC 81 MG tablet, Take 81 mg by mouth., Disp: , Rfl:  .  atorvastatin (LIPITOR) 20 MG tablet, Take 1 tablet (20 mg total) by mouth daily., Disp: 90 tablet, Rfl: 3 .  ferrous sulfate 325 (65 FE) MG tablet, TAKE 1 TABLET BY MOUTH EVERY DAY WITH BREAKFAST, Disp: 90 tablet, Rfl: 1 .  hydrochlorothiazide (MICROZIDE) 12.5 MG capsule, TAKE 1-2 CAPSULES BY MOUTH ONCE DAILY, Disp: 180 capsule, Rfl: 3 .  hydrOXYzine (ATARAX/VISTARIL) 10 MG tablet, TAKE 1 TABLET BY MOUTH THREE TIMES A DAY AS NEEDED, Disp: 270 tablet, Rfl: 2 .  ibuprofen (ADVIL) 800 MG tablet, TAKE 1 TABLET (800 MG TOTAL) BY MOUTH EVERY EVENING., Disp: 30 tablet, Rfl: 5 .  loratadine (CLARITIN) 10 MG tablet, TAKE 1 TABLET (10 MG TOTAL) BY MOUTH DAILY. REPORTED ON 08/31/2015, Disp: 90 tablet, Rfl: 3 .  montelukast (SINGULAIR) 10 MG tablet, TAKE 1 TABLET BY MOUTH EVERY DAY, Disp: 90 tablet, Rfl: 1 .  fluticasone (FLONASE) 50 MCG/ACT nasal spray, Place 2 sprays into both nostrils daily. (Patient not taking: Reported on 06/29/2019), Disp: 16 g, Rfl: 11  Review of Systems  Constitutional: Negative.   Respiratory: Negative.   Cardiovascular: Negative.   Gastrointestinal: Negative.    Genitourinary: Negative.   Musculoskeletal: Positive for arthralgias and myalgias.    Social History   Tobacco Use  . Smoking status: Current Every Day Smoker    Packs/day: 0.50    Years: 40.00    Pack years: 20.00    Types: Cigarettes  . Smokeless tobacco: Never Used  Substance Use Topics  . Alcohol use: Yes    Alcohol/week: 12.0 standard drinks    Types: 12 Cans of beer per week    Comment: 05/06/2012 "3 cans of beer @ a time; more than that sometimes on weekends when I'm not working"      Objective:   BP 111/73 (BP Location: Left Arm, Patient Position: Sitting, Cuff Size: Large)   Pulse 78   Temp 98.9 F (37.2 C) (Temporal)   Resp 16   Wt 217 lb 3.2 oz (98.5 kg)   BMI 39.73 kg/m  Vitals:   06/29/19 1547  BP: 111/73  Pulse: 78  Resp: 16  Temp: 98.9 F (37.2 C)  TempSrc: Temporal  Weight: 217 lb 3.2 oz (98.5 kg)  Body mass index is 39.73 kg/m.   Physical Exam Constitutional:      General: She is not in acute distress.    Appearance: Normal appearance. She is obese. She is not ill-appearing.  Musculoskeletal:     Lumbar back: Normal.     Right hip:  Normal.     Left hip: No deformity, tenderness or bony tenderness. Normal range of motion (had pain with hip flexion and knee bent (felt like a stretch per patient) and also had pain with IR of the left hip, no issue with ER). Normal strength.  Skin:    Findings: No erythema, lesion, rash or wound.  Neurological:     General: No focal deficit present.     Mental Status: She is alert. Mental status is at baseline.      No results found for any visits on 06/29/19.     Assessment & Plan    1. Strain of left piriformis muscle, initial encounter Suspected but unsure. Advised patient to monitor for rash. Will treat with medrol as below since has failed IBU. Advised to use heating pad. May also use epsom salt soaks. Exercises and stretches printed for patient. Call if worsening or not resolving.  -  methylPREDNISolone (MEDROL) 4 MG TBPK tablet; 6 day taper; take as directed on package instructions  Dispense: 21 tablet; Refill: 0     Mar Daring, PA-C  [facility]

## 2019-06-29 NOTE — Patient Instructions (Signed)
Piriformis Syndrome Rehab Ask your health care provider which exercises are safe for you. Do exercises exactly as told by your health care provider and adjust them as directed. It is normal to feel mild stretching, pulling, tightness, or discomfort as you do these exercises. Stop right away if you feel sudden pain or your pain gets worse. Do not begin these exercises until told by your health care provider. Stretching and range-of-motion exercises These exercises warm up your muscles and joints and improve the movement and flexibility of your hip and pelvis. The exercises also help to relieve pain, numbness, and tingling. Hip rotation This is an exercise in which you lie on your back and stretch the muscles that rotate your hip (hip rotators) to stretch your buttocks. 1. Lie on your back on a firm surface. 2. Pull your left / right knee toward your same shoulder with your left / right hand until your knee is pointing toward the ceiling. Hold your left / right ankle with your other hand. 3. Keeping your knee steady, gently pull your left / right ankle toward your other shoulder until you feel a stretch in your buttocks. 4. Hold this position for __________ seconds. Repeat __________ times. Complete this exercise __________ times a day. Hip extensor This is an exercise in which you lie on your back and pull your knee to your chest. 1. Lie on your back on a firm surface. Both of your legs should be straight. 2. Pull your left / right knee to your chest. Hold your leg in this position by holding onto the back of your thigh or the front of your knee. 3. Hold this position for __________ seconds. 4. Slowly return to the starting position. Repeat __________ times. Complete this exercise __________ times a day. Strengthening exercises These exercises build strength and endurance in your hip and thigh muscles. Endurance is the ability to use your muscles for a long time, even after they get  tired. Straight leg raises, side-lying This exercise strengthens the muscles that rotate the leg at the hip and move it away from your body (hip abductors). 1. Lie on your side with your left / right leg in the top position. Lie so your head, shoulder, knee, and hip line up. Bend your bottom knee to help you balance. 2. Lift your top leg 4-6 inches (10-15 cm) while keeping your toes pointed straight ahead. 3. Hold this position for __________ seconds. 4. Slowly lower your leg to the starting position. 5. Let your muscles relax completely after each repetition. Repeat __________ times. Complete this exercise __________ times a day. Hip abduction and rotation This is sometimes called quadruped (on hands and knees) exercises. 1. Get on your hands and knees on a firm, lightly padded surface. Your hands should be directly below your shoulders, and your knees should be directly below your hips. 2. Lift your left / right knee out to the side. Keep your knee bent. Do not twist your body. 3. Hold this position for __________ seconds. 4. Slowly lower your leg. Repeat __________ times. Complete this exercise __________ times a day. Straight leg raises, face-down This exercise stretches the muscles that move your hips away from the front of the pelvis (hip extensors). 1. Lie on your abdomen on a bed or a firm surface with a pillow under your hips. 2. Squeeze your buttocks muscles and lift your left / right leg about 4-6 inches (10-15 cm) off the bed. Do not let your back arch. 3. Hold  this position for __________ seconds. 4. Slowly lower your leg to the starting position. 5. Let your muscles relax completely after each repetition. Repeat __________ times. Complete this exercise __________ times a day. This information is not intended to replace advice given to you by your health care provider. Make sure you discuss any questions you have with your health care provider. Document Revised: 09/25/2018  Document Reviewed: 03/27/2018 Elsevier Patient Education  2020 [employer]vier Inc.  

## 2019-08-11 ENCOUNTER — Other Ambulatory Visit: Payer: Self-pay | Admitting: Physician Assistant

## 2019-08-11 DIAGNOSIS — E7800 Pure hypercholesterolemia, unspecified: Secondary | ICD-10-CM

## 2019-08-11 NOTE — Telephone Encounter (Signed)
Requested medication (s) are due for refill today: yes  Requested medication (s) are on the active medication list: yes  Last refill:  09/27/2018  Future visit scheduled: no  Notes to clinic:  last lipid panel 07/07/18    Requested Prescriptions  Pending Prescriptions Disp Refills   atorvastatin (LIPITOR) 20 MG tablet [Pharmacy Med Name: ATORVASTATIN 20 MG TABLET] 90 tablet 3    Sig: TAKE 1 TABLET BY MOUTH EVERY DAY      Cardiovascular:  Antilipid - Statins Failed - 08/11/2019 10:18 AM      Failed - Total Cholesterol in normal range and within 360 days    Cholesterol, Total  Date Value Ref Range Status  07/07/2018 267 (H) 100 - 199 mg/dL Final          Failed - LDL in normal range and within 360 days    LDL Cholesterol (Calc)  Date Value Ref Range Status  05/20/2017 139 (H) mg/dL (calc) Final    Comment:    Reference range: <100 . Desirable range <100 mg/dL for primary prevention;   <70 mg/dL for patients with CHD or diabetic patients  with > or = 2 CHD risk factors. Marland Kitchen LDL-C is now calculated using the Martin-Hopkins  calculation, which is a validated novel method providing  better accuracy than the Friedewald equation in the  estimation of LDL-C.  Cresenciano Genre et al. Annamaria Helling. [id number]): 2061-2068  (http://education.[employer].com/faq/FAQ164)    LDL Calculated  Date Value Ref Range Status  07/07/2018 179 (H) 0 - 99 mg/dL Final          Failed - HDL in normal range and within 360 days    HDL  Date Value Ref Range Status  07/07/2018 61 >39 mg/dL Final          Failed - Triglycerides in normal range and within 360 days    Triglycerides  Date Value Ref Range Status  07/07/2018 137 0 - 149 mg/dL Final          Passed - Patient is not pregnant      Passed - Valid encounter within last 12 months    Recent Outpatient Visits           1 month ago Strain of left piriformis muscle, initial encounter   [facility] [place], Clearnce Sorrel, [place]    1 year ago Annual physical exam   [place], Clearnce Sorrel, [place]   1 year ago Pruritus   [place], [place], [place]   2 years ago Decreased hemoglobin   [place], Clearnce Sorrel, [place]   2 years ago Annual physical exam   [place], [place], [place]

## 2019-08-11 NOTE — Telephone Encounter (Signed)
Requested medication (s) are due for refill today: yes  Requested medication (s) are on the active medication list: yes  Last refill:  09/27/2018  Future visit scheduled: no  Notes to clinic:  last lipid panel 07/07/18    Requested Prescriptions  Pending Prescriptions Disp Refills   atorvastatin (LIPITOR) 20 MG tablet [Pharmacy Med Name: ATORVASTATIN 20 MG TABLET] 90 tablet 3    Sig: TAKE 1 TABLET BY MOUTH EVERY DAY      Cardiovascular:  Antilipid - Statins Failed - 08/11/2019 10:18 AM      Failed - Total Cholesterol in normal range and within 360 days    Cholesterol, Total  Date Value Ref Range Status  07/07/2018 267 (H) 100 - 199 mg/dL Final          Failed - LDL in normal range and within 360 days    LDL Cholesterol (Calc)  Date Value Ref Range Status  05/20/2017 139 (H) mg/dL (calc) Final    Comment:    Reference range: <100 . Desirable range <100 mg/dL for primary prevention;   <70 mg/dL for patients with CHD or diabetic patients  with > or = 2 CHD risk factors. Marland Kitchen LDL-C is now calculated using the Martin-Hopkins  calculation, which is a validated novel method providing  better accuracy than the Friedewald equation in the  estimation of LDL-C.  Cresenciano Genre et al. Annamaria Helling. [id number]): 2061-2068  (http://education.[facility].com/faq/FAQ164)    LDL Calculated  Date Value Ref Range Status  07/07/2018 179 (H) 0 - 99 mg/dL Final          Failed - HDL in normal range and within 360 days    HDL  Date Value Ref Range Status  07/07/2018 61 >39 mg/dL Final          Failed - Triglycerides in normal range and within 360 days    Triglycerides  Date Value Ref Range Status  07/07/2018 137 0 - 149 mg/dL Final          Passed - Patient is not pregnant      Passed - Valid encounter within last 12 months    Recent Outpatient Visits           1 month ago Strain of left piriformis muscle, initial encounter   [facility] [place], Clearnce Sorrel, [place]    1 year ago Annual physical exam   [place], Clearnce Sor[place], [place]   1 year ago Pruritus   [place], [place], [place]   2 years ago Decreased hemoglobin   [place], Clearnce Sor[place], [place]   2 years ago Annual physical exam   [place], [place], [place]

## 2019-08-17 ENCOUNTER — Telehealth: Payer: Self-pay

## 2019-08-17 DIAGNOSIS — B373 Candidiasis of vulva and vagina: Secondary | ICD-10-CM

## 2019-08-17 DIAGNOSIS — B3731 Acute candidiasis of vulva and vagina: Secondary | ICD-10-CM

## 2019-08-17 MED ORDER — FLUCONAZOLE 150 MG PO TABS: 150.0000 mg | ORAL_TABLET | Freq: Once | ORAL | 0 refills | Status: AC

## 2019-08-17 NOTE — Telephone Encounter (Signed)
Diflucan sent in

## 2019-08-17 NOTE — Telephone Encounter (Signed)
Copied from [place] [id number]. Topic: General - Other >> Aug 17, 2019 11:47 AM Leward Quan A wrote: Reason for CRM: Patient called to ask Fenton Malling for an Rx sent to the pharmacy for yeast infection medication. Per patient she has itching outside the vaginal area. Ph# [phone]

## 2019-08-17 NOTE — Addendum Note (Signed)
Addended by: Mar Daring on: 08/17/2019 03:26 PM   Modules accepted: Orders

## 2019-08-31 DIAGNOSIS — L2089 Other atopic dermatitis: Secondary | ICD-10-CM | POA: Diagnosis not present

## 2019-08-31 DIAGNOSIS — L308 Other specified dermatitis: Secondary | ICD-10-CM | POA: Diagnosis not present

## 2019-09-16 ENCOUNTER — Other Ambulatory Visit: Payer: Self-pay | Admitting: Physician Assistant

## 2019-09-16 DIAGNOSIS — L299 Pruritus, unspecified: Secondary | ICD-10-CM

## 2019-09-28 ENCOUNTER — Ambulatory Visit: Payer: Medicare Other

## 2019-10-06 NOTE — Progress Notes (Deleted)
    Established patient visit    [name]   DOB: [birth year]   64 y.o. Female  MRN: [id number] Visit Date: 10/06/2019  Today's healthcare provider: Mar Daring, PA-C   No chief complaint on file.  Subjective    HPI ***  {Show patient history (optional):23778::" "}   Medications: Outpatient Medications Prior to Visit  Medication Sig  . aspirin EC 81 MG tablet Take 81 mg by mouth.  Marland Kitchen atorvastatin (LIPITOR) 20 MG tablet TAKE 1 TABLET BY MOUTH EVERY DAY  . ferrous sulfate 325 (65 FE) MG tablet TAKE 1 TABLET BY MOUTH EVERY DAY WITH BREAKFAST  . fluticasone (FLONASE) 50 MCG/ACT nasal spray Place 2 sprays into both nostrils daily. (Patient not taking: Reported on 06/29/2019)  . hydrochlorothiazide (MICROZIDE) 12.5 MG capsule TAKE 1-2 CAPSULES BY MOUTH ONCE DAILY  . hydrOXYzine (ATARAX/VISTARIL) 10 MG tablet TAKE 1 TABLET BY MOUTH THREE TIMES A DAY AS NEEDED  . ibuprofen (ADVIL) 800 MG tablet TAKE 1 TABLET (800 MG TOTAL) BY MOUTH EVERY EVENING.  Marland Kitchen loratadine (CLARITIN) 10 MG tablet TAKE 1 TABLET (10 MG TOTAL) BY MOUTH DAILY. REPORTED ON 08/31/2015  . methylPREDNISolone (MEDROL) 4 MG TBPK tablet 6 day taper; take as directed on package instructions  . montelukast (SINGULAIR) 10 MG tablet TAKE 1 TABLET BY MOUTH EVERY DAY   No facility-administered medications prior to visit.    Review of Systems  {Show previous labs (optional):23779::" "}   Objective    There were no vitals taken for this visit. {Show previous vital signs (optional):23777::" "}  Physical Exam  ***  No results found for any visits on 10/12/19.   Assessment & Plan    ***  No follow-ups on file.      {provider attestation***:1}   Rubye Beach  [facility] [phone] (phone) [phone] (fax)  [place]

## 2019-10-12 ENCOUNTER — Ambulatory Visit (INDEPENDENT_AMBULATORY_CARE_PROVIDER_SITE_OTHER): Payer: Medicare Other | Admitting: Physician Assistant

## 2019-10-12 ENCOUNTER — Ambulatory Visit: Payer: Medicare Other | Admitting: Physician Assistant

## 2019-10-12 ENCOUNTER — Other Ambulatory Visit: Payer: Self-pay

## 2019-10-12 ENCOUNTER — Encounter: Payer: Self-pay | Admitting: Physician Assistant

## 2019-10-12 VITALS — BP 114/59 | HR 71 | Temp 97.0°F | Resp 16 | Wt 222.8 lb

## 2019-10-12 DIAGNOSIS — R600 Localized edema: Secondary | ICD-10-CM

## 2019-10-12 DIAGNOSIS — J301 Allergic rhinitis due to pollen: Secondary | ICD-10-CM | POA: Diagnosis not present

## 2019-10-12 DIAGNOSIS — M1710 Unilateral primary osteoarthritis, unspecified knee: Secondary | ICD-10-CM | POA: Diagnosis not present

## 2019-10-12 DIAGNOSIS — Z6841 Body Mass Index (BMI) 40.0 and over, adult: Secondary | ICD-10-CM

## 2019-10-12 DIAGNOSIS — L299 Pruritus, unspecified: Secondary | ICD-10-CM

## 2019-10-12 DIAGNOSIS — T3695XA Adverse effect of unspecified systemic antibiotic, initial encounter: Secondary | ICD-10-CM

## 2019-10-12 DIAGNOSIS — B379 Candidiasis, unspecified: Secondary | ICD-10-CM | POA: Diagnosis not present

## 2019-10-12 DIAGNOSIS — R05 Cough: Secondary | ICD-10-CM | POA: Diagnosis not present

## 2019-10-12 DIAGNOSIS — E7800 Pure hypercholesterolemia, unspecified: Secondary | ICD-10-CM

## 2019-10-12 DIAGNOSIS — R059 Cough, unspecified: Secondary | ICD-10-CM

## 2019-10-12 DIAGNOSIS — E611 Iron deficiency: Secondary | ICD-10-CM

## 2019-10-12 DIAGNOSIS — J0140 Acute pansinusitis, unspecified: Secondary | ICD-10-CM | POA: Diagnosis not present

## 2019-10-12 MED ORDER — FLUCONAZOLE 150 MG PO TABS: 150.0000 mg | ORAL_TABLET | Freq: Once | ORAL | 0 refills | Status: DC

## 2019-10-12 MED ORDER — HYDROXYZINE HCL 10 MG PO TABS: 10.0000 mg | ORAL_TABLET | Freq: Three times a day (TID) | ORAL | 3 refills | Status: DC | PRN

## 2019-10-12 MED ORDER — MONTELUKAST SODIUM 10 MG PO TABS: 10.0000 mg | ORAL_TABLET | Freq: Every day | ORAL | 3 refills | Status: DC

## 2019-10-12 MED ORDER — HYDROCHLOROTHIAZIDE 25 MG PO TABS: 25.0000 mg | ORAL_TABLET | Freq: Two times a day (BID) | ORAL | 3 refills | Status: DC

## 2019-10-12 MED ORDER — HYDROCOD POLST-CPM POLST ER 10-8 MG/5ML PO SUER: 5.0000 mL | Freq: Every evening | ORAL | 0 refills | Status: DC | PRN

## 2019-10-12 MED ORDER — IBUPROFEN 800 MG PO TABS: 800.0000 mg | ORAL_TABLET | Freq: Every evening | ORAL | 3 refills | Status: DC

## 2019-10-12 MED ORDER — AMOXICILLIN 875 MG PO TABS: 875.0000 mg | ORAL_TABLET | Freq: Two times a day (BID) | ORAL | 0 refills | Status: DC

## 2019-10-12 MED ORDER — ATORVASTATIN CALCIUM 20 MG PO TABS: 20.0000 mg | ORAL_TABLET | Freq: Every day | ORAL | 0 refills | Status: DC

## 2019-10-12 MED ORDER — FERROUS SULFATE 325 (65 FE) MG PO TABS: ORAL_TABLET | ORAL | 3 refills | Status: DC

## 2019-10-12 NOTE — Progress Notes (Signed)
Established patient visit   [name]   DOB: 11-16-1955   64 y.o. Female  MRN: [id number] Visit Date: 10/13/2019  Today's healthcare provider: Mar Daring, PA-C   Chief Complaint  Patient presents with  . URI   Subjective    URI  This is a new problem. The current episode started in the past 7 days. The problem has been gradually worsening. There has been no fever. Associated symptoms include congestion, coughing, headaches, sinus pain and sneezing. Pertinent negatives include no abdominal pain, chest pain, ear pain, nausea, rhinorrhea, sore throat or wheezing. She has tried increased fluids and antihistamine (Hydroxyzine) for the symptoms. The treatment provided no relief.   She reports that her right knee was itching. Took Hydroxyzine and it helped and the cream dermatologist prescribed. Per patient this is better.  She is asking for refills on all her medications.  Patient Active Problem List   Diagnosis Date Noted  . Alcohol use 07/07/2018  . Primary osteoarthritis of knee 02/10/2015  . Allergic rhinitis 02/08/2015  . Edema 02/08/2015  . Cardiac murmur 01/24/2015  . 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 Avitaminosis D 11/09/2009  . Adiposity 02/17/2008  . Tobacco use 02/17/2008  . Hypercholesterolemia without hypertriglyceridemia 05/28/2002  . 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 Allergy   . Arthritis    "think I have it in both knees" (05/06/2012)  . Hypertension   . PONV (postoperative nausea and vomiting)    Allergies  Allergen Reactions  . Oxycodone-Acetaminophen Itching  . Tramadol Itching  . Meloxicam Rash       Medications: Outpatient Medications Prior to Visit  Medication Sig  . aspirin EC 81 MG tablet Take 81 mg by mouth.  . loratadine (CLARITIN) 10 MG tablet TAKE 1 TABLET (10 MG TOTAL) BY MOUTH DAILY. REPORTED ON 08/31/2015  . [DISCONTINUED] atorvastatin (LIPITOR) 20 MG  tablet TAKE 1 TABLET BY MOUTH EVERY DAY  . [DISCONTINUED] ferrous sulfate 325 (65 FE) MG tablet TAKE 1 TABLET BY MOUTH EVERY DAY WITH BREAKFAST  . [DISCONTINUED] hydrochlorothiazide (MICROZIDE) 12.5 MG capsule TAKE 1-2 CAPSULES BY MOUTH ONCE DAILY  . [DISCONTINUED] hydrOXYzine (ATARAX/VISTARIL) 10 MG tablet TAKE 1 TABLET BY MOUTH THREE TIMES A DAY AS NEEDED  . [DISCONTINUED] ibuprofen (ADVIL) 800 MG tablet TAKE 1 TABLET (800 MG TOTAL) BY MOUTH EVERY EVENING.  . [DISCONTINUED] montelukast (SINGULAIR) 10 MG tablet TAKE 1 TABLET BY MOUTH EVERY DAY  . fluticasone (FLONASE) 50 MCG/ACT nasal spray Place 2 sprays into both nostrils daily. (Patient not taking: Reported on 06/29/2019)  . [DISCONTINUED] methylPREDNISolone (MEDROL) 4 MG TBPK tablet 6 day taper; take as directed on package instructions   No facility-administered medications prior to visit.    Review of Systems  Constitutional: Negative for chills, fatigue and fever.  HENT: Positive for congestion, sinus pressure, sinus pain and sneezing. Negative for ear pain, rhinorrhea and sore throat.   Respiratory: Positive for cough. Negative for wheezing.   Cardiovascular: Negative for chest pain, palpitations and leg swelling.  Gastrointestinal: Negative for abdominal pain and nausea.  Neurological: Positive for headaches. Negative for dizziness.    Last CBC Lab Results  Component Value Date   WBC 8.7 12/07/2018   HGB 11.8 (L) 12/07/2018   HCT 35.6 (L) 12/07/2018   MCV 90.4 12/07/2018   MCH 29.9 12/07/2018   RDW 14.2 12/07/2018   PLT 373 [id number]   Last metabolic panel  Lab Results  Component Value Date   GLUCOSE 121 (H) 12/07/2018   NA 140 12/07/2018   K 3.4 (L) 12/07/2018   CL 104 12/07/2018   CO2 27 12/07/2018   BUN 17 12/07/2018   CREATININE 0.80 12/07/2018   GFRNONAA >60 12/07/2018   GFRAA >60 12/07/2018   CALCIUM 9.6 12/07/2018   PROT 7.4 07/07/2018   ALBUMIN 4.5 07/07/2018   LABGLOB 2.9 07/07/2018   AGRATIO 1.6  07/07/2018   BILITOT 0.3 07/07/2018   ALKPHOS 82 07/07/2018   AST 17 07/07/2018   ALT 14 07/07/2018   ANIONGAP 9 12/07/2018       Objective    BP (!) 114/59 (BP Location: Left Arm, Patient Position: Sitting, Cuff Size: Large)   Pulse 71   Temp (!) 97 F (36.1 C) (Temporal)   Resp 16   Wt 222 lb 12.8 oz (101.1 kg)   BMI 40.75 kg/m  BP Readings from Last 3 Encounters:  10/12/19 (!) 114/59  06/29/19 111/73  12/07/18 125/78   Wt Readings from Last 3 Encounters:  10/12/19 222 lb 12.8 oz (101.1 kg)  06/29/19 217 lb 3.2 oz (98.5 kg)  12/07/18 218 lb (98.9 kg)      Physical Exam Vitals reviewed.  Constitutional:      General: She is not in acute distress.    Appearance: Normal appearance. She is well-developed. She is obese. She is not ill-appearing or diaphoretic.  HENT:     Head: Normocephalic and atraumatic.     Right Ear: Hearing, tympanic membrane, ear canal and external ear normal.     Left Ear: Hearing, tympanic membrane, ear canal and external ear normal.     Nose: Congestion present.     Right Sinus: Maxillary sinus tenderness present. No frontal sinus tenderness.     Left Sinus: Maxillary sinus tenderness present. No frontal sinus tenderness.     Mouth/Throat:     Dentition: Has dentures.     Pharynx: Oropharynx is clear. Uvula midline. Posterior oropharyngeal erythema present. No pharyngeal swelling or oropharyngeal exudate.  Neck:     Thyroid: No thyromegaly.     Trachea: No tracheal deviation.  Cardiovascular:     Rate and Rhythm: Normal rate and regular rhythm.     Heart sounds: Murmur present. No friction rub. No gallop.   Pulmonary:     Effort: Pulmonary effort is normal. No respiratory distress.     Breath sounds: Normal breath sounds. No stridor. No wheezing or rales.  Musculoskeletal:     Cervical back: Normal range of motion and neck supple.  Lymphadenopathy:     Cervical: No cervical adenopathy.  Neurological:     Mental Status: She is alert.         No results found for any visits on 10/12/19.  Assessment & Plan    1. Acute non-recurrent pansinusitis Worsening symptoms that have not responded to OTC medications. Will give Amoxil as below. Continue allergy medications. Stay well hydrated and get plenty of rest. Call if no symptom improvement or if symptoms worsen. - amoxicillin (AMOXIL) 875 MG tablet; Take 1 tablet (875 mg total) by mouth 2 (two) times daily.  Dispense: 20 tablet; Refill: 0  2. Antibiotic-induced yeast infection Gets yeast infections with antibiotics. Diflucan given as below.  - fluconazole (DIFLUCAN) 150 MG tablet; Take 1 tablet (150 mg total) by mouth once for 1 dose. May repeat in 48-72 hrs if needed  Dispense: 2 tablet; Refill: 0  3. Cough Worsening symptoms that  has not responded to OTC medications. Will give Tussionex cough syrup as below for nighttime cough. Drowsiness precautions given to patient. Stay well hydrated. Use delsym, robitussin OR mucinex for daytime cough. - chlorpheniramine-HYDROcodone (TUSSIONEX PENNKINETIC ER) 10-8 MG/5ML SUER; Take 5 mLs by mouth at bedtime as needed for cough.  Dispense: 120 mL; Refill: 0  4. Seasonal allergic rhinitis due to pollen Stable. Diagnosis pulled for medication refill. Continue current medical treatment plan. - montelukast (SINGULAIR) 10 MG tablet; Take 1 tablet (10 mg total) by mouth daily.  Dispense: 90 tablet; Refill: 3  5. Primary osteoarthritis of knee, unspecified laterality Stable. Diagnosis pulled for medication refill. Continue current medical treatment plan. - ibuprofen (ADVIL) 800 MG tablet; Take 1 tablet (800 mg total) by mouth every evening.  Dispense: 90 tablet; Refill: 3  6. Bilateral edema of lower extremity Increased HCTZ as below. Will check labs as below and f/u pending results. Will check labs as below and f/u pending results. - Comprehensive Metabolic Panel (CMET) - Lipid Panel With LDL/HDL Ratio - hydrochlorothiazide (HYDRODIURIL) 25  MG tablet; Take 1 tablet (25 mg total) by mouth 2 (two) times daily.  Dispense: 180 tablet; Refill: 3  7. Hypercholesterolemia Stable. Diagnosis pulled for medication refill. Continue current medical treatment plan. Will check labs as below and f/u pending results. - atorvastatin (LIPITOR) 20 MG tablet; Take 1 tablet (20 mg total) by mouth daily.  Dispense: 90 tablet; Refill: 0 - Comprehensive Metabolic Panel (CMET) - Lipid Panel With LDL/HDL Ratio  8. Iron deficiency Stable. Diagnosis pulled for medication refill. Continue current medical treatment plan. Will check labs as below and f/u pending results. - ferrous sulfate 325 (65 FE) MG tablet; TAKE 1 TABLET BY MOUTH EVERY DAY WITH BREAKFAST  Dispense: 90 tablet; Refill: 3 - CBC w/Diff/Platelet - Fe+TIBC+Fer  9. Pruritus Stable. Diagnosis pulled for medication refill. Continue current medical treatment plan. - hydrOXYzine (ATARAX/VISTARIL) 10 MG tablet; Take 1 tablet (10 mg total) by mouth 3 (three) times daily as needed.  Dispense: 270 tablet; Refill: 3  10. Class 3 severe obesity due to excess calories with serious comorbidity and body mass index (BMI) of 40.0 to 44.9 in adult [facility]) Counseled patient on healthy lifestyle modifications including dieting and exercise.    No follow-ups on file.      Reynolds Bowl, PA-C, have reviewed all documentation for this visit. The documentation on 10/13/19 for the exam, diagnosis, procedures, and orders are all accurate and complete.   Rubye Beach  [facility] [place] [phone] (phone) [phone] (fax)  [place]

## 2019-10-13 ENCOUNTER — Encounter: Payer: Self-pay | Admitting: Physician Assistant

## 2019-11-15 ENCOUNTER — Other Ambulatory Visit: Payer: Self-pay | Admitting: Physician Assistant

## 2019-11-15 DIAGNOSIS — R600 Localized edema: Secondary | ICD-10-CM

## 2020-01-19 ENCOUNTER — Telehealth: Payer: Self-pay | Admitting: Physician Assistant

## 2020-01-19 NOTE — Telephone Encounter (Signed)
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  rt foot  0.44 Lt foot 0.61   She will be sending FU paperwork by fax.  CB#  [phone]

## 2020-01-21 NOTE — Progress Notes (Signed)
Subjective:   Cathy Townsend is a 64 y.o. female who presents for an Initial Medicare Annual Wellness Visit.   Review of Systems    N/A  Cardiac Risk Factors include: advanced age (>51men, >77 women);obesity (BMI >30kg/m2);smoking/ tobacco exposure;dyslipidemia;hypertension     Objective:    Today's Vitals   01/25/20 1031  BP: 106/62  Pulse: 69  Temp: 98 F (36.7 C)  TempSrc: Oral  SpO2: 98%  Weight: 223 lb 6.4 oz (101.3 kg)  Height: 5\' 2"  (1.575 m)  PainSc: 0-No pain   Body mass index is 40.86 kg/m.  Advanced Directives 01/25/2020 12/07/2018 07/13/2016 02/10/2015 01/27/2015 05/06/2012 05/01/2012  Does Patient Have a Medical Advance Directive? Yes No No No No Patient does not have advance directive;Patient would not like information Patient does not have advance directive;Patient would not like information  Type of Scientist, forensic Power of [place];Living will - - - - - -  Copy of [place] in Chart? No - copy requested - - - - - -  Would patient like information on creating a medical advance directive? - No - Patient declined - No - patient declined information;Yes - Educational materials given No - patient declined information - -  Pre-existing out of facility DNR order (yellow form or pink MOST form) - - - - - - No    Current Medications (verified) Outpatient Encounter Medications as of 01/25/2020  Medication Sig  . aspirin EC 81 MG tablet Take 81 mg by mouth.  Marland Kitchen atorvastatin (LIPITOR) 20 MG tablet Take 1 tablet (20 mg total) by mouth daily.  . hydrochlorothiazide (HYDRODIURIL) 25 MG tablet Take 1 tablet (25 mg total) by mouth 2 (two) times daily.  . hydrOXYzine (ATARAX/VISTARIL) 10 MG tablet Take 1 tablet (10 mg total) by mouth 3 (three) times daily as needed.  Marland Kitchen ibuprofen (ADVIL) 800 MG tablet Take 1 tablet (800 mg total) by mouth every evening. (Patient taking differently: Take 800 mg by mouth every other day. )  . loratadine (CLARITIN) 10  MG tablet TAKE 1 TABLET (10 MG TOTAL) BY MOUTH DAILY. REPORTED ON 08/31/2015  . montelukast (SINGULAIR) 10 MG tablet Take 1 tablet (10 mg total) by mouth daily.  Marland Kitchen amoxicillin (AMOXIL) 875 MG tablet Take 1 tablet (875 mg total) by mouth 2 (two) times daily. (Patient not taking: Reported on 01/25/2020)  . chlorpheniramine-HYDROcodone (TUSSIONEX PENNKINETIC ER) 10-8 MG/5ML SUER Take 5 mLs by mouth at bedtime as needed for cough. (Patient not taking: Reported on 01/25/2020)  . ferrous sulfate 325 (65 FE) MG tablet TAKE 1 TABLET BY MOUTH EVERY DAY WITH BREAKFAST (Patient not taking: Reported on 01/25/2020)  . fluticasone (FLONASE) 50 MCG/ACT nasal spray Place 2 sprays into both nostrils daily. (Patient not taking: Reported on 06/29/2019)  . [DISCONTINUED] ferrous sulfate 325 (65 FE) MG tablet TAKE 1 TABLET BY MOUTH EVERY DAY WITH BREAKFAST   No facility-administered encounter medications on file as of 01/25/2020.    Allergies (verified) Oxycodone-acetaminophen, Tramadol, and Meloxicam   History: Past Medical History:  Diagnosis Date  . Allergy   . Arthritis    "think I have it in both knees" (05/06/2012)  . Hyperlipidemia   . Hypertension   . PONV (postoperative nausea and vomiting)    Past Surgical History:  Procedure Laterality Date  . COLONOSCOPY WITH PROPOFOL N/A 07/13/2016   Procedure: COLONOSCOPY WITH PROPOFOL;  Surgeon: Jonathon Bellows, MD;  Location: [facility] ENDOSCOPY;  Service: Endoscopy;  Laterality: N/A;  . DILATION AND  CURETTAGE OF UTERUS    . JOINT REPLACEMENT     right knee  . REPLACEMENT TOTAL KNEE  ~ 2008   right  . TOTAL KNEE ARTHROPLASTY Left 02/10/2015   Procedure: TOTAL KNEE ARTHROPLASTY;  Surgeon: Hessie Knows, MD;  Location: [facility] ORS;  Service: Orthopedics;  Laterality: Left;  . TOTAL KNEE REVISION  05/05/2012   Procedure: TOTAL KNEE REVISION;  Surgeon: Kerin Salen, MD;  Location: [place];  Service: Orthopedics;  Laterality: Right;  . TOTAL THYROIDECTOMY  1990's  . TUBAL LIGATION      Family History  Problem Relation Age of Onset  . CAD Mother   . Heart disease Mother   . Heart attack Brother    Social History   Socioeconomic History  . Marital status: Married    Spouse name: Not on file  . Number of children: 2  . Years of education: Not on file  . Highest education level: High school graduate  Occupational History  . Occupation: retired  Tobacco Use  . Smoking status: Current Every Day Smoker    Packs/day: 0.50    Years: 40.00    Pack years: 20.00    Types: Cigarettes  . Smokeless tobacco: Never Used  Vaping Use  . Vaping Use: Never used  Substance and Sexual Activity  . Alcohol use: Yes    Alcohol/week: 7.0 standard drinks    Types: 7 Cans of beer per week    Comment: 1/day  . Drug use: No  . Sexual activity: Never  Other Topics Concern  . Not on file  Social History Narrative  . Not on file   Social Determinants of Health   Financial Resource Strain: Low Risk   . Difficulty of Paying Living Expenses: Not hard at all  Food Insecurity: No Food Insecurity  . Worried About Charity fundraiser in the Last Year: Never true  . Ran Out of Food in the Last Year: Never true  Transportation Needs: No Transportation Needs  . Lack of Transportation (Medical): No  . Lack of Transportation (Non-Medical): No  Physical Activity: Insufficiently Active  . Days of Exercise per Week: 2 days  . Minutes of Exercise per Session: 20 min  Stress: No Stress Concern Present  . Feeling of Stress : Not at all  Social Connections: Moderately Integrated  . Frequency of Communication with Friends and Family: More than three times a week  . Frequency of Social Gatherings with Friends and Family: More than three times a week  . Attends Religious Services: More than 4 times per year  . Active Member of Clubs or Organizations: No  . Attends Archivist Meetings: Never  . Marital Status: Married    Tobacco Counseling Ready to quit: No Counseling given:  No   Clinical Intake:  Pre-visit preparation completed: Yes  Pain : No/denies pain Pain Score: 0-No pain     Nutritional Status: BMI > 30  Obese Nutritional Risks: None Diabetes: No  How often do you need to have someone help you when you read instructions, pamphlets, or other written materials from your doctor or pharmacy?: 1 - Never  Diabetic? No  Interpreter Needed?: No  Information entered by :: [facility], LPN   Activities of Daily Living In your present state of health, do you have any difficulty performing the following activities: 01/25/2020 06/29/2019  Hearing? N N  Vision? N N  Comment Wears eye glasses. -  Difficulty concentrating or making decisions? N N  Walking  or climbing stairs? N N  Dressing or bathing? N N  Doing errands, shopping? N N  Preparing Food and eating ? N -  Using the Toilet? N -  In the past six months, have you accidently leaked urine? N -  Do you have problems with loss of bowel control? N -  Managing your Medications? N -  Managing your Finances? N -  Housekeeping or managing your Housekeeping? N -  Some recent data might be hidden    Patient Care Team: Mar Daring, PA-C as PCP - General (Family Medicine) Odette Fraction (Optometry) Jannet Mantis, MD (Dermatology)  Indicate any recent Medical Services you may have received from other than [facility] providers in the past year (date may be approximate).     Assessment:   This is a routine wellness examination for [place].  Hearing/Vision screen No exam data present  Dietary issues and exercise activities discussed: Current Exercise Habits: Home exercise routine, Type of exercise: walking;Other - see comments (Also does stretches 7 days a week.), Time (Minutes): 25, Frequency (Times/Week): 2, Weekly Exercise (Minutes/Week): 50, Intensity: Mild, Exercise limited by: None identified  Goals    . Weight (lb) < 200 lb (90.7 kg)     Recommend to cut back on sugar in  daily diet to help with weight loss.       Depression Screen PHQ 2/9 Scores 01/25/2020 10/12/2019 07/07/2018 05/20/2017 05/15/2016  PHQ - 2 Score 0 - 0 0 0  PHQ- 9 Score - - 1 0 -  Exception Documentation - Patient refusal - - -    Fall Risk Fall Risk  01/25/2020 06/29/2019 05/20/2017 05/15/2016  Falls in the past year? 0 0 No No  Number falls in past yr: 0 0 - -  Injury with Fall? 0 0 - -  Follow up - Falls evaluation completed - -    Any stairs in or around the home? Yes  If so, are there any without handrails? No Home free of loose throw rugs in walkways, pet beds, electrical cords, etc? Yes  Adequate lighting in your home to reduce risk of falls? Yes   ASSISTIVE DEVICES UTILIZED TO PREVENT FALLS:  Life alert? No  Use of a cane, walker or w/c? No  Grab bars in the bathroom? No  Shower chair or bench in shower? No  Elevated toilet seat or a handicapped toilet? No    Cognitive Function:     6CIT Screen 01/25/2020  What Year? 0 points  What month? 0 points  What time? 0 points  Count back from 20 0 points  Months in reverse 4 points  Repeat phrase 4 points  Total Score 8    Immunizations Immunization History  Administered Date(s) Administered  . Fluad Quad(high Dose 65+) 03/31/2019  . Influenza,inj,Quad PF,6+ Mos 05/20/2017, 05/26/2018  . Moderna SARS-COVID-2 Vaccination 08/01/2019, 08/29/2019  . Pneumococcal Conjugate-13 01/25/2020  . Pneumococcal Polysaccharide-23 04/06/2014  . Td 05/28/2002  . Tdap 11/09/2009  . Zoster 12/31/2013    TDAP status: Due, Education has been provided regarding the importance of this vaccine. Advised may receive this vaccine at local pharmacy or Health Dept. Aware to provide a copy of the vaccination record if obtained from local pharmacy or Health Dept. Verbalized acceptance and understanding. Flu Vaccine status: Up to date Pneumococcal vaccine status: Completed during today's visit. Covid-19 vaccine status: Completed  vaccines  Qualifies for Shingles Vaccine? Yes   Zostavax completed Yes   Shingrix Completed?: No.  Education has been provided regarding the importance of this vaccine. Patient has been advised to call insurance company to determine out of pocket expense if they have not yet received this vaccine. Advised may also receive vaccine at local pharmacy or Health Dept. Verbalized acceptance and understanding.  Screening Tests Health Maintenance  Topic Date Due  . COLONOSCOPY  07/13/2017  . MAMMOGRAM  09/06/2018  . INFLUENZA VACCINE  01/17/2020  . TETANUS/TDAP  01/24/2021 (Originally 11/10/2019)  . PNA vac Low Risk Adult (2 of 2 - PPSV23) 01/24/2021  . PAP SMEAR-Modifier  07/07/2021  . DEXA SCAN  Completed  . COVID-19 Vaccine  Completed  . Hepatitis C Screening  Completed  . HIV Screening  Completed    Health Maintenance  Health Maintenance Due  Topic Date Due  . COLONOSCOPY  07/13/2017  . MAMMOGRAM  09/06/2018  . INFLUENZA VACCINE  01/17/2020    Colorectal cancer screening: Referral to GI placed today. Pt aware the office will call re: appt. Mammogram status: Currently due, declined order at this time.  Bone Density status: Completed 11/09/09. Previous DEXA scan was normal. No repeat needed unless advised by a physician.  Lung Cancer Screening: (Low Dose CT Chest recommended if Age 86-80 years, 30 pack-year currently smoking OR have quit w/in 15years.) does qualify, however declined order at this time.    Additional Screening:  Hepatitis C Screening: Up to date  Vision Screening: Recommended annual ophthalmology exams for early detection of glaucoma and other disorders of the eye. Is the patient up to date with their annual eye exam?  Yes  Who is the provider or what is the name of the office in which the patient attends annual eye exams? [employer] [facility] [employer] [facility] If pt is not established with a provider, would they like to be referred to a provider to establish care? No .    Dental Screening: Recommended annual dental exams for proper oral hygiene  Community Resource Referral / Chronic Care Management: CRR required this visit?  No   CCM required this visit?  No      Plan:     I have personally reviewed and noted the following in the patient's chart:   . Medical and social history . Use of alcohol, tobacco or illicit drugs  . Current medications and supplements . Functional ability and status . Nutritional status . Physical activity . Advanced directives . List of other physicians . Hospitalizations, surgeries, and ER visits in previous 12 months . Vitals . Screenings to include cognitive, depression, and falls . Referrals and appointments  In addition, I have reviewed and discussed with patient certain preventive protocols, quality metrics, and best practice recommendations. A written personalized care plan for preventive services as well as general preventive health recommendations were provided to patient.     Lovenia Debruler [place], [place]   5/0/0370   Nurse Notes: Declined a mammogram order at this time.

## 2020-01-25 ENCOUNTER — Other Ambulatory Visit: Payer: Self-pay

## 2020-01-25 ENCOUNTER — Ambulatory Visit (INDEPENDENT_AMBULATORY_CARE_PROVIDER_SITE_OTHER): Payer: Medicare Other

## 2020-01-25 VITALS — BP 106/62 | HR 69 | Temp 98.0°F | Ht 62.0 in | Wt 223.4 lb

## 2020-01-25 DIAGNOSIS — Z23 Encounter for immunization: Secondary | ICD-10-CM

## 2020-01-25 DIAGNOSIS — Z1211 Encounter for screening for malignant neoplasm of colon: Secondary | ICD-10-CM

## 2020-01-25 DIAGNOSIS — Z0000 Encounter for general adult medical examination without abnormal findings: Secondary | ICD-10-CM | POA: Diagnosis not present

## 2020-01-25 NOTE — Patient Instructions (Signed)
Cathy Townsend , Thank you for taking time to come for your Medicare Wellness Visit. I appreciate your ongoing commitment to your health goals. Please review the following plan we discussed and let me know if I can assist you in the future.   Screening recommendations/referrals: Colonoscopy: Colorectal cancer screening: Referral to GI placed today. Pt aware the office will call re: appt. Mammogram: Currently due, declined today.  Bone Density: Previous DEXA scan was normal. No repeat needed unless advised by a physician. Recommended yearly ophthalmology/optometry visit for glaucoma screening and checkup Recommended yearly dental visit for hygiene and checkup  Vaccinations: Influenza vaccine: Done 03/31/19 Pneumococcal vaccine: Prevnar 13 administered today.  Tdap vaccine: Currently due, declined today.  Shingles vaccine: Shingrix discussed. Please contact your pharmacy for coverage information.     Advanced directives: Please bring a copy of your POA (Power of Attorney) and/or Living Will to your next appointment.   Conditions/risks identified: Obesity- recommend to cut back on sugar in daily diet to help with weight loss.   Next appointment: 03/21/20 @ 8:00 AM with [place] 65 Years and Older, Female Preventive care refers to lifestyle choices and visits with your health care provider that can promote health and wellness. What does preventive care include?  A yearly physical exam. This is also called an annual well check.  Dental exams once or twice a year.  Routine eye exams. Ask your health care provider how often you should have your eyes checked.  Personal lifestyle choices, including:  Daily care of your teeth and gums.  Regular physical activity.  Eating a healthy diet.  Avoiding tobacco and drug use.  Limiting alcohol use.  Practicing safe sex.  Taking low-dose aspirin every day.  Taking vitamin and mineral supplements as recommended by  your health care provider. What happens during an annual well check? The services and screenings done by your health care provider during your annual well check will depend on your age, overall health, lifestyle risk factors, and family history of disease. Counseling  Your health care provider may ask you questions about your:  Alcohol use.  Tobacco use.  Drug use.  Emotional well-being.  Home and relationship well-being.  Sexual activity.  Eating habits.  History of falls.  Memory and ability to understand (cognition).  Work and work Statistician.  Reproductive health. Screening  You may have the following tests or measurements:  Height, weight, and BMI.  Blood pressure.  Lipid and cholesterol levels. These may be checked every 5 years, or more frequently if you are over 3 years old.  Skin check.  Lung cancer screening. You may have this screening every year starting at age 21 if you have a 30-pack-year history of smoking and currently smoke or have quit within the past 15 years.  Fecal occult blood test (FOBT) of the stool. You may have this test every year starting at age 35.  Flexible sigmoidoscopy or colonoscopy. You may have a sigmoidoscopy every 5 years or a colonoscopy every 10 years starting at age 23.  Hepatitis C blood test.  Hepatitis B blood test.  Sexually transmitted disease (STD) testing.  Diabetes screening. This is done by checking your blood sugar (glucose) after you have not eaten for a while (fasting). You may have this done every 1-3 years.  Bone density scan. This is done to screen for osteoporosis. You may have this done starting at age 75.  Mammogram. This may be done every 1-2 years. Talk to  your health care provider about how often you should have regular mammograms. Talk with your health care provider about your test results, treatment options, and if necessary, the need for more tests. Vaccines  Your health care provider may  recommend certain vaccines, such as:  Influenza vaccine. This is recommended every year.  Tetanus, diphtheria, and acellular pertussis (Tdap, Td) vaccine. You may need a Td booster every 10 years.  Zoster vaccine. You may need this after age 13.  Pneumococcal 13-valent conjugate (PCV13) vaccine. One dose is recommended after age 92.  Pneumococcal polysaccharide (PPSV23) vaccine. One dose is recommended after age 73. Talk to your health care provider about which screenings and vaccines you need and how often you need them. This information is not intended to replace advice given to you by your health care provider. Make sure you discuss any questions you have with your health care provider. Document Released: 07/01/2015 Document Revised: 02/22/2016 Document Reviewed: 04/05/2015 Elsevier Interactive Patient Education  2017 [place] Prevention in the Home Falls can cause injuries. They can happen to people of all ages. There are many things you can do to make your home safe and to help prevent falls. What can I do on the outside of my home?  Regularly fix the edges of walkways and driveways and fix any cracks.  Remove anything that might make you trip as you walk through a door, such as a raised step or threshold.  Trim any bushes or trees on the path to your home.  Use bright outdoor lighting.  Clear any walking paths of anything that might make someone trip, such as rocks or tools.  Regularly check to see if handrails are loose or broken. Make sure that both sides of any steps have handrails.  Any raised decks and porches should have guardrails on the edges.  Have any leaves, snow, or ice cleared regularly.  Use sand or salt on walking paths during winter.  Clean up any spills in your garage right away. This includes oil or grease spills. What can I do in the bathroom?  Use night lights.  Install grab bars by the toilet and in the tub and shower. Do not use towel  bars as grab bars.  Use non-skid mats or decals in the tub or shower.  If you need to sit down in the shower, use a plastic, non-slip stool.  Keep the floor dry. Clean up any water that spills on the floor as soon as it happens.  Remove soap buildup in the tub or shower regularly.  Attach bath mats securely with double-sided non-slip rug tape.  Do not have throw rugs and other things on the floor that can make you trip. What can I do in the bedroom?  Use night lights.  Make sure that you have a light by your bed that is easy to reach.  Do not use any sheets or blankets that are too big for your bed. They should not hang down onto the floor.  Have a firm chair that has side arms. You can use this for support while you get dressed.  Do not have throw rugs and other things on the floor that can make you trip. What can I do in the kitchen?  Clean up any spills right away.  Avoid walking on wet floors.  Keep items that you use a lot in easy-to-reach places.  If you need to reach something above you, use a strong step stool that has  a grab bar.  Keep electrical cords out of the way.  Do not use floor polish or wax that makes floors slippery. If you must use wax, use non-skid floor wax.  Do not have throw rugs and other things on the floor that can make you trip. What can I do with my stairs?  Do not leave any items on the stairs.  Make sure that there are handrails on both sides of the stairs and use them. Fix handrails that are broken or loose. Make sure that handrails are as long as the stairways.  Check any carpeting to make sure that it is firmly attached to the stairs. Fix any carpet that is loose or worn.  Avoid having throw rugs at the top or bottom of the stairs. If you do have throw rugs, attach them to the floor with carpet tape.  Make sure that you have a light switch at the top of the stairs and the bottom of the stairs. If you do not have them, ask someone to  add them for you. What else can I do to help prevent falls?  Wear shoes that:  Do not have high heels.  Have rubber bottoms.  Are comfortable and fit you well.  Are closed at the toe. Do not wear sandals.  If you use a stepladder:  Make sure that it is fully opened. Do not climb a closed stepladder.  Make sure that both sides of the stepladder are locked into place.  Ask someone to hold it for you, if possible.  Clearly mark and make sure that you can see:  Any grab bars or handrails.  First and last steps.  Where the edge of each step is.  Use tools that help you move around (mobility aids) if they are needed. These include:  Canes.  Walkers.  Scooters.  Crutches.  Turn on the lights when you go into a dark area. Replace any light bulbs as soon as they burn out.  Set up your furniture so you have a clear path. Avoid moving your furniture around.  If any of your floors are uneven, fix them.  If there are any pets around you, be aware of where they are.  Review your medicines with your doctor. Some medicines can make you feel dizzy. This can increase your chance of falling. Ask your doctor what other things that you can do to help prevent falls. This information is not intended to replace advice given to you by your health care provider. Make sure you discuss any questions you have with your health care provider. Document Released: 03/31/2009 Document Revised: 11/10/2015 Document Reviewed: 07/09/2014 Elsevier Interactive Patient Education  2017 [employer].

## 2020-02-01 ENCOUNTER — Telehealth (INDEPENDENT_AMBULATORY_CARE_PROVIDER_SITE_OTHER): Payer: Self-pay | Admitting: Gastroenterology

## 2020-02-01 DIAGNOSIS — Z1211 Encounter for screening for malignant neoplasm of colon: Secondary | ICD-10-CM

## 2020-02-01 NOTE — Progress Notes (Signed)
Colonoscopy Triage cpmpleted however patient only has Monday and Tuesday to work with.  If we scheduled her colonoscopy on Monday COVID testing would be on Thursday and by scheduling her on Tuesday she would need COVID testing on Friday.  This schedule does not work well for her job.  She said she will need to call back at another time.   Gastroenterology Pre-Procedure Review  Request Date: Not scheduled due to work schedule Requesting Physician:  PATIENT REVIEW QUESTIONS: The patient responded to the following health history questions as indicated:    1. Are you having any GI issues? no 2. Do you have a personal history of Polyps? no 3. Do you have a family history of Colon Cancer or Polyps? no 4. Diabetes Mellitus? no 5. Joint replacements in the past 12 months?no 6. Major health problems in the past 3 months?no 7. Any artificial heart valves, MVP, or defibrillator?no    MEDICATIONS & ALLERGIES:    Patient reports the following regarding taking any anticoagulation/antiplatelet therapy:   Plavix, Coumadin, Eliquis, Xarelto, Lovenox, Pradaxa, Brilinta, or Effient? no Aspirin? Yes 81 mg daily  Patient confirms/reports the following medications:  Current Outpatient Medications  Medication Sig Dispense Refill  . aspirin EC 81 MG tablet Take 81 mg by mouth.    Marland Kitchen atorvastatin (LIPITOR) 20 MG tablet Take 1 tablet (20 mg total) by mouth daily. 90 tablet 0  . hydrochlorothiazide (HYDRODIURIL) 25 MG tablet Take 1 tablet (25 mg total) by mouth 2 (two) times daily. 180 tablet 3  . hydrOXYzine (ATARAX/VISTARIL) 10 MG tablet Take 1 tablet (10 mg total) by mouth 3 (three) times daily as needed. 270 tablet 3  . ibuprofen (ADVIL) 800 MG tablet Take 1 tablet (800 mg total) by mouth every evening. (Patient taking differently: Take 800 mg by mouth every other day. ) 90 tablet 3  . montelukast (SINGULAIR) 10 MG tablet Take 1 tablet (10 mg total) by mouth daily. 90 tablet 3   No current  facility-administered medications for this visit.    Patient confirms/reports the following allergies:  Allergies  Allergen Reactions  . Oxycodone-Acetaminophen Itching  . Tramadol Itching  . Meloxicam Rash    No orders of the defined types were placed in this encounter.   AUTHORIZATION INFORMATION Primary Insurance: 1D#: Group #:  Secondary Insurance: 1D#: Group #:  SCHEDULE INFORMATION: Date: Not scheduled due to work. Time: Location:

## 2020-02-04 ENCOUNTER — Other Ambulatory Visit: Payer: Self-pay | Admitting: Physician Assistant

## 2020-02-04 DIAGNOSIS — R600 Localized edema: Secondary | ICD-10-CM

## 2020-03-10 ENCOUNTER — Other Ambulatory Visit: Payer: Self-pay | Admitting: Physician Assistant

## 2020-03-10 DIAGNOSIS — R600 Localized edema: Secondary | ICD-10-CM

## 2020-03-16 ENCOUNTER — Other Ambulatory Visit: Payer: Self-pay | Admitting: Physician Assistant

## 2020-03-16 DIAGNOSIS — E7800 Pure hypercholesterolemia, unspecified: Secondary | ICD-10-CM

## 2020-03-16 NOTE — Telephone Encounter (Signed)
Requested medication (s) are due for refill today: yes  Requested medication (s) are on the active medication list: yes  Last refill: 09/22/19  #90  0 refills  Future visit scheduled: yes  Notes to clinic:  pharmacy requesting Dx code    Requested Prescriptions  Pending Prescriptions Disp Refills   atorvastatin (LIPITOR) 20 MG tablet [Pharmacy Med Name: ATORVASTATIN 20 MG TABLET] 90 tablet 0    Sig: TAKE 1 TABLET BY MOUTH EVERY DAY      Cardiovascular:  Antilipid - Statins Failed - 03/16/2020  9:08 AM      Failed - Total Cholesterol in normal range and within 360 days    Cholesterol, Total  Date Value Ref Range Status  07/07/2018 267 (H) 100 - 199 mg/dL Final          Failed - LDL in normal range and within 360 days    LDL Cholesterol (Calc)  Date Value Ref Range Status  05/20/2017 139 (H) mg/dL (calc) Final    Comment:    Reference range: <100 . Desirable range <100 mg/dL for primary prevention;   <70 mg/dL for patients with CHD or diabetic patients  with > or = 2 CHD risk factors. Marland Kitchen LDL-C is now calculated using the Martin-Hopkins  calculation, which is a validated novel method providing  better accuracy than the Friedewald equation in the  estimation of LDL-C.  Cresenciano Genre et al. Annamaria Helling. 7741;287(86): 2061-2068  (http://education.[employer].com/faq/FAQ164)    LDL Calculated  Date Value Ref Range Status  07/07/2018 179 (H) 0 - 99 mg/dL Final          Failed - HDL in normal range and within 360 days    HDL  Date Value Ref Range Status  07/07/2018 61 >39 mg/dL Final          Failed - Triglycerides in normal range and within 360 days    Triglycerides  Date Value Ref Range Status  07/07/2018 137 0 - 149 mg/dL Final          Passed - Patient is not pregnant      Passed - Valid encounter within last 12 months    Recent Outpatient Visits           5 months ago Acute non-recurrent pansinusitis   [place], [place]    8 months ago Strain of left piriformis muscle, initial encounter   [facility] [place], Clearnce Sorrel, [place]   1 year ago Annual physical exam   [place], Clearnce Sorrel, [place]   2 years ago Pruritus   [place], [place], [place]   2 years ago Decreased hemoglobin   [place], Clearnce Sorrel, [place]       Future Appointments             In 5 days [place], Clearnce Sorrel, PA-C [employer], [place]

## 2020-03-18 NOTE — Progress Notes (Signed)
Complete physical exam   [name]   DOB: 1956/04/16   64 y.o. Female  MRN: [id number] Visit Date: 03/21/2020  Today's healthcare provider: Mar Daring, PA-C   Chief Complaint  Patient presents with  . Annual Exam   Subjective    Cathy Townsend is a 64 y.o. female who presents today for a complete physical exam.  She reports consuming a general diet. Home exercise routine includes stretching. She generally feels well. She reports sleeping well. She does have additional problems to discuss today.  HPI  Patient had AWV with [facility] 01/25/20. Per patient she had flu shot At [place] two months ago.   Past Medical History:  Diagnosis Date  . Allergy   . Arthritis    "think I have it in both knees" (05/06/2012)  . Hyperlipidemia   . Hypertension   . PONV (postoperative nausea and vomiting)    Past Surgical History:  Procedure Laterality Date  . COLONOSCOPY WITH PROPOFOL N/A 07/13/2016   Procedure: COLONOSCOPY WITH PROPOFOL;  Surgeon: Jonathon Bellows, MD;  Location: [facility] END[facility]COPY;  Service: Endoscopy;  Laterality: N/A;  . DILATION AND CURETTAGE OF UTERUS    . JOINT REPLACEMENT     right knee  . REPLACEMENT TOTAL KNEE  ~ 2008   right  . TOTAL KNEE ARTHROPLASTY Left 02/10/2015   Procedure: TOTAL KNEE ARTHROPLASTY;  Surgeon: Hessie Knows, MD;  Location: [facility] ORS;  Service: Orthopedics;  Laterality: Left;  . TOTAL KNEE REVISION  05/05/2012   Procedure: TOTAL KNEE REVISION;  Surgeon: Kerin Salen, MD;  Location: [facility];  Service: Orthopedics;  Laterality: Right;  . TOTAL THYROIDECTOMY  1990's  . TUBAL LIGATION     Social History   Socioeconomic History  . Marital status: Married    Spouse name: Not on file  . Number of children: 2  . Years of education: Not on file  . Highest education level: High school graduate  Occupational History  . Occupation: retired  Tobacco Use  . Smoking status: Current Every Day Smoker    Packs/day: 0.50     Years: 40.00    Pack years: 20.00    Types: Cigarettes  . Smokeless tobacco: Never Used  Vaping Use  . Vaping Use: Never used  Substance and Sexual Activity  . Alcohol use: Yes    Alcohol/week: 7.0 standard drinks    Types: 7 Cans of beer per week    Comment: 1/day  . Drug use: No  . Sexual activity: Never  Other Topics Concern  . Not on file  Social History Narrative  . Not on file   Social Determinants of Health   Financial Resource Strain: Low Risk   . Difficulty of Paying Living Expenses: Not hard at all  Food Insecurity: No Food Insecurity  . Worried About Charity fundraiser in the Last Year: Never true  . Ran Out of Food in the Last Year: Never true  Transportation Needs: No Transportation Needs  . Lack of Transportation (Medical): No  . Lack of Transportation (Non-Medical): No  Physical Activity: Insufficiently Active  . Days of Exercise per Week: 2 days  . Minutes of Exercise per Session: 20 min  Stress: No Stress Concern Present  . Feeling of Stress : Not at all  Social Connections: Moderately Integrated  . Frequency of Communication with Friends and Family: More than three times a week  . Frequency of Social Gatherings with Friends and Family: More  than three times a week  . Attends Religious Services: More than 4 times per year  . Active Member of Clubs or Organizations: No  . Attends Archivist Meetings: Never  . Marital Status: Married  Human resources officer Violence: Not At Risk  . Fear of Current or Ex-Partner: No  . Emotionally Abused: No  . Physically Abused: No  . Sexually Abused: No   Family Status  Relation Name Status  . Mother  Alive  . Brother  Deceased at age 38  . Father  Deceased at age 92  . Sister  Deceased at age 14  . Brother  Deceased at age 66       gunshot wound  . Daughter  Alive  . Son  Alive  . Sister  Alive  . Sister  Alive  . Sister  Alive   Family History  Problem Relation Age of Onset  . CAD Mother   . Heart  disease Mother   . Heart attack Brother    Allergies  Allergen Reactions  . Oxycodone-Acetaminophen Itching  . Tramadol Itching  . Meloxicam Rash    Patient Care Team: Mar Daring, PA-C as PCP - General (Family Medicine) Odette Fraction (Optometry) Jannet Mantis, MD (Dermatology)   Medications: Outpatient Medications Prior to Visit  Medication Sig  . aspirin EC 81 MG tablet Take 81 mg by mouth.  Marland Kitchen atorvastatin (LIPITOR) 20 MG tablet Take 1 tablet (20 mg total) by mouth daily.  . hydrochlorothiazide (HYDRODIURIL) 25 MG tablet Take 1 tablet (25 mg total) by mouth 2 (two) times daily.  . hydrOXYzine (ATARAX/VISTARIL) 10 MG tablet Take 1 tablet (10 mg total) by mouth 3 (three) times daily as needed.  Marland Kitchen ibuprofen (ADVIL) 800 MG tablet Take 1 tablet (800 mg total) by mouth every evening. (Patient taking differently: Take 800 mg by mouth every other day. )  . montelukast (SINGULAIR) 10 MG tablet Take 1 tablet (10 mg total) by mouth daily.   No facility-administered medications prior to visit.    Review of Systems  Constitutional: Negative.   HENT: Negative.   Eyes: Negative.   Respiratory: Positive for cough ("at night").   Cardiovascular: Negative.   Gastrointestinal: Negative.   Endocrine: Negative.   Genitourinary: Negative.   Musculoskeletal: Negative.   Skin: Negative.   Allergic/Immunologic: Negative.   Neurological: Negative.   Hematological: Negative.   Psychiatric/Behavioral: Negative.       Objective    BP (!) 114/50 (BP Location: Left Arm, Patient Position: Sitting, Cuff Size: Large)   Pulse 67   Temp 98.4 F (36.9 C) (Oral)   Resp 16   Ht 5\' 2"  (1.575 m)   Wt 222 lb 3.2 oz (100.8 kg)   SpO2 100%   BMI 40.64 kg/m    Physical Exam Vitals reviewed.  Constitutional:      General: She is not in acute distress.    Appearance: Normal appearance. She is well-developed. She is obese. She is not ill-appearing or diaphoretic.  HENT:      Head: Normocephalic and atraumatic.     Right Ear: Tympanic membrane, ear canal and external ear normal.     Left Ear: Tympanic membrane, ear canal and external ear normal.     Nose: Nose normal.     Mouth/Throat:     Mouth: Mucous membranes are moist.     Pharynx: Oropharynx is clear. No oropharyngeal exudate or posterior oropharyngeal erythema.  Eyes:     General: No  scleral icterus.       Right eye: No discharge.        Left eye: No discharge.     Extraocular Movements: Extraocular movements intact.     Conjunctiva/sclera: Conjunctivae normal.     Pupils: Pupils are equal, round, and reactive to light.  Neck:     Thyroid: No thyromegaly.     Vascular: No carotid bruit or JVD.     Trachea: No tracheal deviation.  Cardiovascular:     Rate and Rhythm: Normal rate and regular rhythm.     Pulses: Normal pulses.     Heart sounds: Normal heart sounds. No murmur heard.  No friction rub. No gallop.   Pulmonary:     Effort: Pulmonary effort is normal. No respiratory distress.     Breath sounds: Normal breath sounds. No wheezing or rales.  Chest:     Chest wall: No tenderness.  Abdominal:     General: Abdomen is flat. Bowel sounds are normal. There is no distension.     Palpations: Abdomen is soft. There is no mass.     Tenderness: There is no abdominal tenderness. There is no guarding or rebound.  Musculoskeletal:        General: No tenderness. Normal range of motion.     Cervical back: Normal range of motion and neck supple. No tenderness.     Right lower leg: No edema.     Left lower leg: No edema.  Lymphadenopathy:     Cervical: No cervical adenopathy.  Skin:    General: Skin is warm and dry.     Capillary Refill: Capillary refill takes less than 2 seconds.     Findings: No rash.  Neurological:     General: No focal deficit present.     Mental Status: She is alert and oriented to person, place, and time. Mental status is at baseline.  Psychiatric:        Mood and Affect:  Mood normal.        Behavior: Behavior normal.        Thought Content: Thought content normal.        Judgment: Judgment normal.     Last depression screening scores PHQ 2/9 Scores 01/25/2020 10/12/2019 07/07/2018  PHQ - 2 Score 0 - 0  PHQ- 9 Score - - 1  Exception Documentation - Patient refusal -   Last fall risk screening Fall Risk  01/25/2020  Falls in the past year? 0  Number falls in past yr: 0  Injury with Fall? 0  Follow up -   Last Audit-C alcohol use screening Alcohol Use Disorder Test (AUDIT) 01/25/2020  1. How often do you have a drink containing alcohol? 4  2. How many drinks containing alcohol do you have on a typical day when you are drinking? 0  3. How often do you have six or more drinks on one occasion? 0  AUDIT-C Score 4  4. How often during the last year have you found that you were not able to stop drinking once you had started? 0  5. How often during the last year have you failed to do what was normally expected from you because of drinking? 0  6. How often during the last year have you needed a first drink in the morning to get yourself going after a heavy drinking session? 0  7. How often during the last year have you had a feeling of guilt of remorse after drinking? 0  8. How  often during the last year have you been unable to remember what happened the night before because you had been drinking? 0  9. Have you or someone else been injured as a result of your drinking? 0  10. Has a relative or friend or a doctor or another health worker been concerned about your drinking or suggested you cut down? 0  Alcohol Use Disorder Identification Test Final Score (AUDIT) 4  Alcohol Brief Interventions/Follow-up AUDIT Score <7 follow-up not indicated   A score of 3 or more in women, and 4 or more in men indicates increased risk for alcohol abuse, EXCEPT if all of the points are from question 1   No results found for any visits on 03/21/20.  Assessment & Plan    Routine  Health Maintenance and Physical Exam  Exercise Activities and Dietary recommendations Goals    . Weight (lb) < 200 lb (90.7 kg)     Recommend to cut back on sugar in daily diet to help with weight loss.        Immunization History  Administered Date(s) Administered  . Fluad Quad(high Dose 65+) 03/31/2019  . Influenza,inj,Quad PF,6+ Mos 05/20/2017, 05/26/2018  . Moderna SARS-COVID-2 Vaccination 08/01/2019, 08/29/2019  . Pneumococcal Conjugate-13 01/25/2020  . Pneumococcal Polysaccharide-23 04/06/2014  . Td 05/28/2002  . Tdap 11/09/2009  . Zoster 12/31/2013    Health Maintenance  Topic Date Due  . COLONOSCOPY  07/13/2017  . MAMMOGRAM  09/06/2018  . INFLUENZA VACCINE  01/17/2020  . TETANUS/TDAP  01/24/2021 (Originally 11/10/2019)  . PNA vac Low Risk Adult (2 of 2 - PPSV23) 01/24/2021  . DEXA SCAN  Completed  . COVID-19 Vaccine  Completed  . Hepatitis C Screening  Completed    Discussed health benefits of physical activity, and encouraged her to engage in regular exercise appropriate for her age and condition.  1. Annual physical exam Normal physical exam today. Will check labs as below and f/u pending lab results. If labs are stable and WNL she will not need to have these rechecked for one year at her next annual physical exam. She is to call the office in the meantime if she has any acute issue, questions or concerns.  2. Rash Stable. Diagnosis pulled for medication refill. Continue current medical treatment plan. - triamcinolone cream (KENALOG) 0.1 %; Apply 1 application topically 2 (two) times daily.  Dispense: 80 g; Refill: 0  3. Hypercholesterolemia without hypertriglyceridemia Stable. Continue Atorvastatin 20mg . Will check labs as below and f/u pending results. - CBC w/Diff/Platelet - Comprehensive Metabolic Panel (CMET) - Lipid Panel With LDL/HDL Ratio  4. Class 3 severe obesity due to excess calories with serious comorbidity and body mass index (BMI) of 40.0 to  44.9 in adult [facility]) Counseled patient on healthy lifestyle modifications including dieting and exercise. Will check labs as below and f/u pending results. - CBC w/Diff/Platelet - Comprehensive Metabolic Panel (CMET) - TSH - Lipid Panel With LDL/HDL Ratio - HgB A1c   No follow-ups on file.     Reynolds Bowl, PA-C, have reviewed all documentation for this visit. The documentation on 03/24/20 for the exam, diagnosis, procedures, and orders are all accurate and complete.   Rubye Beach  [facility] [phone] (phone) [phone] (fax)  [place]

## 2020-03-21 ENCOUNTER — Ambulatory Visit (INDEPENDENT_AMBULATORY_CARE_PROVIDER_SITE_OTHER): Payer: Medicare Other | Admitting: Physician Assistant

## 2020-03-21 ENCOUNTER — Encounter: Payer: Self-pay | Admitting: Physician Assistant

## 2020-03-21 ENCOUNTER — Other Ambulatory Visit: Payer: Self-pay

## 2020-03-21 VITALS — BP 114/50 | HR 67 | Temp 98.4°F | Resp 16 | Ht 62.0 in | Wt 222.2 lb

## 2020-03-21 DIAGNOSIS — E7800 Pure hypercholesterolemia, unspecified: Secondary | ICD-10-CM

## 2020-03-21 DIAGNOSIS — R21 Rash and other nonspecific skin eruption: Secondary | ICD-10-CM | POA: Diagnosis not present

## 2020-03-21 DIAGNOSIS — R7309 Other abnormal glucose: Secondary | ICD-10-CM | POA: Diagnosis not present

## 2020-03-21 DIAGNOSIS — Z6841 Body Mass Index (BMI) 40.0 and over, adult: Secondary | ICD-10-CM

## 2020-03-21 DIAGNOSIS — Z0000 Encounter for general adult medical examination without abnormal findings: Secondary | ICD-10-CM | POA: Diagnosis not present

## 2020-03-21 MED ORDER — TRIAMCINOLONE ACETONIDE 0.1 % EX CREA: 1.0000 "application " | TOPICAL_CREAM | Freq: Two times a day (BID) | CUTANEOUS | 0 refills | Status: DC

## 2020-03-21 NOTE — Patient Instructions (Signed)

## 2020-03-22 ENCOUNTER — Telehealth: Payer: Self-pay

## 2020-03-22 LAB — COMPREHENSIVE METABOLIC PANEL
ALT: 20 IU/L (ref 0–32)
AST: 25 IU/L (ref 0–40)
Albumin/Globulin Ratio: 1.6 (ref 1.2–2.2)
Albumin: 4.6 g/dL (ref 3.8–4.8)
Alkaline Phosphatase: 103 IU/L (ref 44–121)
BUN/Creatinine Ratio: 19 (ref 12–28)
BUN: 14 mg/dL (ref 8–27)
Bilirubin Total: <0.2 mg/dL (ref 0.0–1.2)
CO2: 20 mmol/L (ref 20–29)
Calcium: 9.7 mg/dL (ref 8.7–10.3)
Chloride: 103 mmol/L (ref 96–106)
Creatinine, Ser: 0.72 mg/dL (ref 0.57–1.00)
GFR calc Af Amer: 101 mL/min/{1.73_m2} (ref >59)
GFR calc non Af Amer: 88 mL/min/{1.73_m2} (ref >59)
Globulin, Total: 2.9 g/dL (ref 1.5–4.5)
Glucose: 94 mg/dL (ref 65–99)
Potassium: 3.8 mmol/L (ref 3.5–5.2)
Sodium: 142 mmol/L (ref 134–144)
Total Protein: 7.5 g/dL (ref 6.0–8.5)

## 2020-03-22 LAB — CBC WITH DIFFERENTIAL/PLATELET
Basophils Absolute: 0 10*3/uL (ref 0.0–0.2)
Basos: 1 %
EOS (ABSOLUTE): 0.2 10*3/uL (ref 0.0–0.4)
Eos: 5 %
Hematocrit: 37.9 % (ref 34.0–46.6)
Hemoglobin: 12.2 g/dL (ref 11.1–15.9)
Immature Grans (Abs): 0 10*3/uL (ref 0.0–0.1)
Immature Granulocytes: 0 %
Lymphocytes Absolute: 1.1 10*3/uL (ref 0.7–3.1)
Lymphs: 24 %
MCH: 29.8 pg (ref 26.6–33.0)
MCHC: 32.2 g/dL (ref 31.5–35.7)
MCV: 92 fL (ref 79–97)
Monocytes Absolute: 0.4 10*3/uL (ref 0.1–0.9)
Monocytes: 8 %
Neutrophils Absolute: 2.8 10*3/uL (ref 1.4–7.0)
Neutrophils: 62 %
Platelets: 398 10*3/uL (ref 150–450)
RBC: 4.1 x10E6/uL (ref 3.77–5.28)
RDW: 13 % (ref 11.7–15.4)
WBC: 4.5 10*3/uL (ref 3.4–10.8)

## 2020-03-22 LAB — LIPID PANEL WITH LDL/HDL RATIO
Cholesterol, Total: 215 mg/dL — ABNORMAL HIGH (ref 100–199)
HDL: 62 mg/dL (ref >39)
LDL Chol Calc (NIH): 137 mg/dL — ABNORMAL HIGH (ref 0–99)
LDL/HDL Ratio: 2.2 ratio (ref 0.0–3.2)
Triglycerides: 90 mg/dL (ref 0–149)
VLDL Cholesterol Cal: 16 mg/dL (ref 5–40)

## 2020-03-22 LAB — HEMOGLOBIN A1C
Est. average glucose Bld gHb Est-mCnc: 114 mg/dL
Hgb A1c MFr Bld: 5.6 % (ref 4.8–5.6)

## 2020-03-22 LAB — TSH: TSH: 0.658 u[IU]/mL (ref 0.450–4.500)

## 2020-03-22 NOTE — Telephone Encounter (Signed)
Called patient no answer/voicemail not set up yet. If patient calls ok for [facility] nurse to give results.

## 2020-03-22 NOTE — Telephone Encounter (Signed)
Patient returned call- notified of lab results and PCP recommendation. Patient is very pleased.

## 2020-03-22 NOTE — Telephone Encounter (Signed)
-----   Message from Mar Daring, [place] sent at 03/22/2020  8:49 AM EDT ----- Blood count is normal. Kidney and liver function are normal. Sodium, potassium, and calcium are normal. Thyroid is normal. Cholesterol is much improved. Continue Atorvastatin 20mg . A1c/sugar are normal.

## 2020-04-11 ENCOUNTER — Ambulatory Visit (INDEPENDENT_AMBULATORY_CARE_PROVIDER_SITE_OTHER): Payer: Medicare Other | Admitting: Internal Medicine

## 2020-04-11 ENCOUNTER — Other Ambulatory Visit: Payer: Self-pay

## 2020-04-11 DIAGNOSIS — Z23 Encounter for immunization: Secondary | ICD-10-CM | POA: Diagnosis not present

## 2020-04-11 NOTE — Progress Notes (Signed)
Patient came for 3rd covid shot

## 2020-04-12 ENCOUNTER — Telehealth: Payer: Self-pay | Admitting: Physician Assistant

## 2020-04-12 ENCOUNTER — Other Ambulatory Visit: Payer: Self-pay | Admitting: Adult Health

## 2020-04-12 DIAGNOSIS — B379 Candidiasis, unspecified: Secondary | ICD-10-CM

## 2020-04-12 MED ORDER — FLUCONAZOLE 150 MG PO TABS: ORAL_TABLET | ORAL | 0 refills | Status: DC

## 2020-04-12 NOTE — Telephone Encounter (Signed)
I will send in Diflucan, if for yeast infection as  this is what she had prior. If any symptoms change, worsen or persist she should follow up immediately with office visit.

## 2020-04-12 NOTE — Telephone Encounter (Signed)
Please triage symptoms and verify if she was recently on antibiotics ?  Is this new ?  When was her last infection ?  Any new symptoms.

## 2020-04-12 NOTE — Telephone Encounter (Signed)
Spoke with patient reports that this is the same thing she gets all the time and that she hasn't been on any recent antibiotic and that she is going to wait until Tawanna Sat comes back to see what she says.

## 2020-04-12 NOTE — Telephone Encounter (Signed)
Yeast infection Diflucan? This is a Cathy Townsend patient. She is out the whole week.

## 2020-04-12 NOTE — Telephone Encounter (Unsigned)
Copied from [place] [phone]. Topic: General - Other >> Apr 12, 2020  7:58 AM [name] wrote: PT has a gist infection asking for the same medication she got last time / please advise

## 2020-04-12 NOTE — Telephone Encounter (Signed)
Called patient twice no answer/not able to [facility] voicemail is full

## 2020-04-18 ENCOUNTER — Other Ambulatory Visit: Payer: Self-pay | Admitting: Physician Assistant

## 2020-04-18 DIAGNOSIS — E7800 Pure hypercholesterolemia, unspecified: Secondary | ICD-10-CM

## 2020-06-27 DIAGNOSIS — L309 Dermatitis, unspecified: Secondary | ICD-10-CM | POA: Diagnosis not present

## 2020-06-27 DIAGNOSIS — L2089 Other atopic dermatitis: Secondary | ICD-10-CM | POA: Diagnosis not present

## 2020-08-28 ENCOUNTER — Other Ambulatory Visit: Payer: Self-pay | Admitting: Physician Assistant

## 2020-08-28 DIAGNOSIS — M1710 Unilateral primary osteoarthritis, unspecified knee: Secondary | ICD-10-CM

## 2020-08-28 NOTE — Telephone Encounter (Signed)
Requested Prescriptions  Pending Prescriptions Disp Refills  . ibuprofen (ADVIL) 800 MG tablet [Pharmacy Med Name: IBUPROFEN 800 MG TABLET] 90 tablet 1    Sig: TAKE 1 TABLET (800 MG TOTAL) BY MOUTH EVERY EVENING.     Analgesics:  NSAIDS Passed - 08/28/2020  1:21 PM      Passed - Cr in normal range and within 360 days    Creatinine, Ser  Date Value Ref Range Status  03/21/2020 0.72 0.57 - 1.00 mg/dL Final         Passed - HGB in normal range and within 360 days    Hemoglobin  Date Value Ref Range Status  03/21/2020 12.2 11.1 - 15.9 g/dL Final         Passed - Patient is not pregnant      Passed - Valid encounter within last 12 months    Recent Outpatient Visits          5 months ago Annual physical exam   [facility] [place], [place]   10 months ago Acute non-recurrent pansinusitis   [facility] [place], [place], [place]   1 year ago Strain of left piriformis muscle, initial encounter   [facility] [place], Clearnce Sorrel, [place]   2 years ago Annual physical exam   [place], Clearnce Sorrel, [place]   2 years ago Pruritus   [place], [place]      Future Appointments            In 1 week Marlyn Corporal, Clearnce Sorrel, PA-C [employer], [place]

## 2020-09-05 ENCOUNTER — Other Ambulatory Visit: Payer: Self-pay

## 2020-09-05 ENCOUNTER — Ambulatory Visit (INDEPENDENT_AMBULATORY_CARE_PROVIDER_SITE_OTHER): Payer: Medicare Other | Admitting: Physician Assistant

## 2020-09-05 ENCOUNTER — Encounter: Payer: Self-pay | Admitting: Physician Assistant

## 2020-09-05 VITALS — BP 97/66 | HR 67 | Temp 98.1°F | Wt 202.0 lb

## 2020-09-05 DIAGNOSIS — J301 Allergic rhinitis due to pollen: Secondary | ICD-10-CM

## 2020-09-05 DIAGNOSIS — R21 Rash and other nonspecific skin eruption: Secondary | ICD-10-CM | POA: Diagnosis not present

## 2020-09-05 DIAGNOSIS — L299 Pruritus, unspecified: Secondary | ICD-10-CM

## 2020-09-05 DIAGNOSIS — Z6836 Body mass index (BMI) 36.0-36.9, adult: Secondary | ICD-10-CM

## 2020-09-05 DIAGNOSIS — R600 Localized edema: Secondary | ICD-10-CM

## 2020-09-05 DIAGNOSIS — E7800 Pure hypercholesterolemia, unspecified: Secondary | ICD-10-CM

## 2020-09-05 MED ORDER — HYDROXYZINE HCL 25 MG PO TABS: 25.0000 mg | ORAL_TABLET | Freq: Every evening | ORAL | 1 refills | Status: DC | PRN

## 2020-09-05 MED ORDER — HYDROCHLOROTHIAZIDE 25 MG PO TABS: 25.0000 mg | ORAL_TABLET | Freq: Two times a day (BID) | ORAL | 3 refills | Status: DC

## 2020-09-05 MED ORDER — ATORVASTATIN CALCIUM 20 MG PO TABS: 20.0000 mg | ORAL_TABLET | Freq: Every day | ORAL | 3 refills | Status: DC

## 2020-09-05 MED ORDER — HYDROXYZINE HCL 10 MG PO TABS: 10.0000 mg | ORAL_TABLET | Freq: Three times a day (TID) | ORAL | 3 refills | Status: DC | PRN

## 2020-09-05 MED ORDER — MONTELUKAST SODIUM 10 MG PO TABS: 10.0000 mg | ORAL_TABLET | Freq: Every day | ORAL | 3 refills | Status: DC

## 2020-09-05 MED ORDER — TRIAMCINOLONE ACETONIDE 0.1 % EX CREA: 1.0000 "application " | TOPICAL_CREAM | Freq: Two times a day (BID) | CUTANEOUS | 0 refills | Status: DC

## 2020-09-05 NOTE — Patient Instructions (Signed)

## 2020-09-05 NOTE — Progress Notes (Signed)
Established patient visit   [name]   DOB: 08-04-1955   65 y.o. Female  MRN: [id number] Visit Date: 09/05/2020  Today's healthcare provider: Mar Daring, PA-C   Chief Complaint  Patient presents with  . Rash   Subjective    HPI  Lipid/Cholesterol, Follow-up  Last lipid panel Other pertinent labs  Lab Results  Component Value Date   CHOL 215 (H) 03/21/2020   HDL 62 03/21/2020   LDLCALC 137 (H) 03/21/2020   TRIG 90 03/21/2020   CHOLHDL 4.4 07/07/2018   Lab Results  Component Value Date   ALT 20 03/21/2020   AST 25 03/21/2020   PLT 398 03/21/2020   TSH 0.658 03/21/2020     She was last seen for this 5 months ago.  Management since that visit includes no changes.  She reports excellent compliance with treatment. She is not having side effects.   Symptoms: No chest pain No chest pressure/discomfort  No dyspnea No lower extremity edema  No numbness or tingling of extremity No orthopnea  No palpitations No paroxysmal nocturnal dyspnea  No speech difficulty No syncope   Current diet: in general, a "healthy" diet    The 10-year ASCVD risk score Mikey Bussing DC Brooke Bonito., et al., 2013) is: 15%  ---------------------------------------------------------------------------------------------------   Patient Active Problem List   Diagnosis Date Noted  . Alcohol use 07/07/2018  . Primary osteoarthritis of knee 02/10/2015  . Allergic rhinitis 02/08/2015  . Edema 02/08/2015  . Cardiac murmur 01/24/2015  . 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 Avitaminosis D 11/09/2009  . Adiposity 02/17/2008  . Tobacco use 02/17/2008  . Hypercholesterolemia without hypertriglyceridemia 05/28/2002  . 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 Allergy   . Arthritis    "think I have it in both knees" (05/06/2012)  . Hyperlipidemia   . Hypertension   . PONV (postoperative nausea and vomiting)    Social History    Tobacco Use  . Smoking status: Current Every Day Smoker    Packs/day: 0.50    Years: 40.00    Pack years: 20.00    Types: Cigarettes  . Smokeless tobacco: Never Used  Vaping Use  . Vaping Use: Never used  Substance Use Topics  . Alcohol use: Yes    Alcohol/week: 7.0 standard drinks    Types: 7 Cans of beer per week    Comment: 1/day  . Drug use: No   Allergies  Allergen Reactions  . Oxycodone-Acetaminophen Itching  . Tramadol Itching  . Meloxicam Rash     Medications: Outpatient Medications Prior to Visit  Medication Sig  . aspirin EC 81 MG tablet Take 81 mg by mouth.  Marland Kitchen atorvastatin (LIPITOR) 20 MG tablet TAKE 1 TABLET BY MOUTH EVERY DAY  . fluconazole (DIFLUCAN) 150 MG tablet Take one tablet on day 1 and may  Repeat dose of 1 tablet  in 72 hrs if needed  . hydrochlorothiazide (HYDRODIURIL) 25 MG tablet Take 1 tablet (25 mg total) by mouth 2 (two) times daily.  . hydrOXYzine (ATARAX/VISTARIL) 10 MG tablet Take 1 tablet (10 mg total) by mouth 3 (three) times daily as needed.  Marland Kitchen ibuprofen (ADVIL) 800 MG tablet TAKE 1 TABLET (800 MG TOTAL) BY MOUTH EVERY EVENING.  . montelukast (SINGULAIR) 10 MG tablet Take 1 tablet (10 mg total) by mouth daily.  Marland Kitchen triamcinolone cream (KENALOG) 0.1 % Apply 1 application topically 2 (two) times daily.  No facility-administered medications prior to visit.    Review of Systems  Constitutional: Negative.   Respiratory: Negative.   Cardiovascular: Negative.   Gastrointestinal: Negative.   Neurological: Negative for dizziness, light-headedness and headaches.       Objective    There were no vitals taken for this visit.   Physical Exam Vitals reviewed.  Constitutional:      General: She is not in acute distress.    Appearance: Normal appearance. She is well-developed. She is obese. She is not ill-appearing or diaphoretic.  HENT:     Head: Normocephalic and atraumatic.  Cardiovascular:     Rate and Rhythm: Normal rate and  regular rhythm.     Pulses: Normal pulses.     Heart sounds: Murmur (3/6 holosystolic murmur heard throughout) heard.  No friction rub. No gallop.   Pulmonary:     Effort: Pulmonary effort is normal. No respiratory distress.     Breath sounds: Normal breath sounds. No wheezing or rales.  Musculoskeletal:     Cervical back: Normal range of motion and neck supple.  Neurological:     Mental Status: She is alert.      No results found for any visits on 09/05/20.  Assessment & Plan      1. Hypercholesterolemia Stable. Diagnosis pulled for medication refill. Continue current medical treatment plan. CPE due in Oct 2022.  - atorvastatin (LIPITOR) 20 MG tablet; Take 1 tablet (20 mg total) by mouth daily.  Dispense: 90 tablet; Refill: 3  2. Rash Currently not present. Needs refills for PRN.  - triamcinolone (KENALOG) 0.1 %; Apply 1 application topically 2 (two) times daily.  Dispense: 80 g; Refill: 0 - hydrOXYzine (ATARAX/VISTARIL) 25 MG tablet; Take 1 tablet (25 mg total) by mouth at bedtime as needed.  Dispense: 90 tablet; Refill: 1  3. Pruritus Stable. Diagnosis pulled for medication refill. Continue current medical treatment plan. - hydrOXYzine (ATARAX/VISTARIL) 25 MG tablet; Take 1 tablet (25 mg total) by mouth at bedtime as needed.  Dispense: 90 tablet; Refill: 1  4. Seasonal allergic rhinitis due to pollen Stable. Diagnosis pulled for medication refill. Continue current medical treatment plan. - montelukast (SINGULAIR) 10 MG tablet; Take 1 tablet (10 mg total) by mouth daily.  Dispense: 90 tablet; Refill: 3  5. Bilateral edema of lower extremity Stable. Diagnosis pulled for medication refill. Continue current medical treatment plan. - hydrochlorothiazide (HYDRODIURIL) 25 MG tablet; Take 1 tablet (25 mg total) by mouth 2 (two) times daily.  Dispense: 180 tablet; Refill: 3  6. Class 2 severe obesity due to excess calories with serious comorbidity and body mass index (BMI) of 36.0  to 36.9 in adult [facility]) Doing well. Has lost 20 pounds on her own. Counseled patient on healthy lifestyle modifications including dieting and exercise.    No follow-ups on file.      Reynolds Bowl, PA-C, have reviewed all documentation for this visit. The documentation on 09/05/20 for the exam, diagnosis, procedures, and orders are all accurate and complete.   Rubye Beach  [facility] [phone] (phone) [phone] (fax)  [place]

## 2020-09-20 DIAGNOSIS — L2089 Other atopic dermatitis: Secondary | ICD-10-CM | POA: Diagnosis not present

## 2020-10-03 DIAGNOSIS — L2089 Other atopic dermatitis: Secondary | ICD-10-CM | POA: Diagnosis not present

## 2020-10-17 ENCOUNTER — Other Ambulatory Visit: Payer: Self-pay | Admitting: Physician Assistant

## 2020-10-17 DIAGNOSIS — L309 Dermatitis, unspecified: Secondary | ICD-10-CM | POA: Diagnosis not present

## 2020-10-17 DIAGNOSIS — E611 Iron deficiency: Secondary | ICD-10-CM

## 2020-10-17 DIAGNOSIS — L2089 Other atopic dermatitis: Secondary | ICD-10-CM | POA: Diagnosis not present

## 2020-10-17 DIAGNOSIS — R21 Rash and other nonspecific skin eruption: Secondary | ICD-10-CM

## 2020-10-18 NOTE — Telephone Encounter (Signed)
Notes to clinic: Review medication for continued use and refill   Requested Prescriptions  Pending Prescriptions Disp Refills   triamcinolone cream (KENALOG) 0.1 % [Pharmacy Med Name: TRIAMCINOLONE 0.1% CREAM] 60 g     Sig: APPLY TO [place]      Dermatology:  Corticosteroids Passed - 10/17/2020  6:54 PM      Passed - Valid encounter within last 12 months    Recent Outpatient Visits           1 month ago Hypercholesterolemia   [facility], Clearnce Sorrel, PA-C   7 months ago Annual physical exam   [place], Clearnce Sorrel, [place]   1 year ago Acute non-recurrent pansinusitis   [facility] [place], [place], [place]   1 year ago Strain of left piriformis muscle, initial encounter   [facility] [place], Clearnce Sorrel, [place]   2 years ago Annual physical exam   [facility], Clearnce Sorrel, [place]       Future Appointments             In 5 months Bacigalupo, Dionne Bucy, MD [facility], [facility]               ferrous sulfate 325 (65 FE) MG tablet [Pharmacy Med Name: FERROUS SULFATE 325 MG TABLET] 90 tablet 3    Sig: TAKE 1 TABLET BY [place]      Endocrinology:  Minerals - Iron Supplementation Failed - 10/17/2020  6:54 PM      Failed - Fe (serum) in normal range and within 360 days    No results found for: IRON, IRONPCTSAT        Failed - Ferritin in normal range and within 360 days    No results found for: FERRITIN        Passed - HGB in normal range and within 360 days    Hemoglobin  Date Value Ref Range Status  03/21/2020 12.2 11.1 - 15.9 g/dL Final          Passed - HCT in normal range and within 360 days    Hematocrit  Date Value Ref Range Status  03/21/2020 37.9 34.0 - 46.6 % Final          Passed - RBC in normal range and within 360 days    RBC  Date Value Ref Range Status  03/21/2020 4.10 3.77 - 5.28 x10E6/uL Final   12/07/2018 3.94 3.87 - 5.11 MIL/uL Final          Passed - Valid encounter within last 12 months    Recent Outpatient Visits           1 month ago Hypercholesterolemia   [place], Clearnce Sorrel, PA-C   7 months ago Annual physical exam   [place], Clearnce Sorrel, [place]   1 year ago Acute non-recurrent pansinusitis   [place], [place], [place]   1 year ago Strain of left piriformis muscle, initial encounter   [facility] [place], Clearnce Sorrel, [place]   2 years ago Annual physical exam   [facility], Clearnce Sorrel, [place]       Future Appointments             In 5 months Bacigalupo, Dionne Bucy, MD [facility] [place], [place]

## 2020-12-25 ENCOUNTER — Other Ambulatory Visit: Payer: Self-pay | Admitting: Physician Assistant

## 2020-12-25 DIAGNOSIS — L299 Pruritus, unspecified: Secondary | ICD-10-CM

## 2020-12-25 DIAGNOSIS — R21 Rash and other nonspecific skin eruption: Secondary | ICD-10-CM

## 2020-12-26 ENCOUNTER — Ambulatory Visit: Payer: Medicare Other | Admitting: Internal Medicine

## 2020-12-26 ENCOUNTER — Telehealth: Payer: Self-pay

## 2020-12-26 NOTE — Telephone Encounter (Signed)
Please review.  Can you work her in somewhere?   Thanks,   -Mickel Baas

## 2020-12-26 NOTE — Telephone Encounter (Signed)
Called to [facility] for OV and app't made for 4:20 with Dr Neomia Dear.  Patient declined appointment because she did not want to do a virtual appointment   Copied from [place] [phone]. Topic: Appointment Scheduling - Scheduling Inquiry for Clinic >> Dec 26, 2020  7:55 AM Cathy Townsend wrote: Reason for CRM: Pt does not think she can wait until tomorrow. Pt is having chest congestion started yesterday and having sniffing. Pt would like appt today. Please advise

## 2020-12-28 NOTE — Telephone Encounter (Signed)
LMTCB, please have patient triaged by nurse, when was initial onset of symptoms? What symptoms is she having? Did she take at home test or was she tested in laboratory? Was patient previously vaccinated for covid? What pharmacy does patient use? KW

## 2020-12-28 NOTE — Telephone Encounter (Signed)
Pt stated she tested positive for covid, please advise.

## 2020-12-28 NOTE — Telephone Encounter (Signed)
Patient called, ringing, no answer, no answering machine set up.

## 2020-12-29 ENCOUNTER — Ambulatory Visit: Payer: Self-pay | Admitting: *Deleted

## 2020-12-29 MED ORDER — NIRMATRELVIR/RITONAVIR (PAXLOVID)TABLET: 3.0000 | ORAL_TABLET | Freq: Two times a day (BID) | ORAL | 0 refills | Status: DC

## 2020-12-29 NOTE — Telephone Encounter (Signed)
Ok to send in [place] - last gfr 101. Will need to hold atorvastatin while taking it

## 2020-12-29 NOTE — Telephone Encounter (Signed)
Reason for Disposition  [1] HIGH RISK for severe COVID complications (e.g., weak immune system, age > 70 years, obesity with BMI > 25, pregnant, chronic lung disease or other chronic medical condition) AND [2] COVID symptoms (e.g., cough, fever)  (Exceptions: Already seen by PCP and no new or worsening symptoms.)  Answer Assessment - Initial Assessment Questions 1. COVID-19 DIAGNOSIS: "Who made your COVID-19 diagnosis?" "Was it confirmed by a positive lab test or self-test?" If not diagnosed by a doctor (or NP/PA), ask "Are there lots of cases (community spread) where you live?" Note: See public health department website, if unsure.     7/13- lab test- health dept, 7/12- + home test 2. COVID-19 EXPOSURE: "Was there any known exposure to COVID before the symptoms began?" CDC Definition of close contact: within 6 feet (2 meters) for a total of 15 minutes or more over a 24-hour period.      Exposed with mother 3. ONSET: "When did the COVID-19 symptoms start?"      7/10 4. WORST SYMPTOM: "What is your worst symptom?" (e.g., cough, fever, shortness of breath, muscle aches)     Sinus drainage, cough 5. COUGH: "Do you have a cough?" If Yes, ask: "How bad is the cough?"       Yes-constant- hard to sleep 6. FEVER: "Do you have a fever?" If Yes, ask: "What is your temperature, how was it measured, and when did it start?"     no 7. RESPIRATORY STATUS: "Describe your breathing?" (e.g., shortness of breath, wheezing, unable to speak)      Fine- no trouble 8. BETTER-SAME-WORSE: "Are you getting better, staying the same or getting worse compared to yesterday?"  If getting worse, ask, "In what way?"     Better- cough is improving 9. HIGH RISK DISEASE: "Do you have any chronic medical problems?" (e.g., asthma, heart or lung disease, weak immune system, obesity, etc.)     age 65. VACCINE: "Have you had the COVID-19 vaccine?" If Yes, ask: "Which one, how many shots, when did you get it?"       Yes- Moderna 11.  BOOSTER: "Have you received your COVID-19 booster?" If Yes, ask: "Which one and when did you get it?"       no 12. PREGNANCY: "Is there any chance you are pregnant?" "When was your last menstrual period?"       N/a 13. OTHER SYMPTOMS: "Do you have any other symptoms?"  (e.g., chills, fatigue, headache, loss of smell or taste, muscle pain, sore throat)       no 14. O2 SATURATION MONITOR:  "Do you use an oxygen saturation monitor (pulse oximeter) at home?" If Yes, ask "What is your reading (oxygen level) today?" "What is your usual oxygen saturation reading?" (e.g., 95%)       no  Protocols used: Coronavirus (COVID-19) Diagnosed or Suspected-A-AH

## 2020-12-29 NOTE — Telephone Encounter (Signed)
See triage note for patient

## 2020-12-29 NOTE — Addendum Note (Signed)
Addended by: Minette Headland on: 12/29/2020 03:16 PM   Modules accepted: Orders

## 2020-12-29 NOTE — Telephone Encounter (Signed)
Patient advised.KW 

## 2020-12-29 NOTE — Telephone Encounter (Signed)
Patient is calling to report she tested + COVID 7/12- home,7/13-lab. Patient states she was exposed to family member and she started coughing 7/10. Patient states her main symptom is cough and sinus drainage. Patient advised per protocol regarding care, isolation and treatments. Call sent for review by PCP.

## 2021-01-02 ENCOUNTER — Telehealth: Payer: Self-pay

## 2021-01-02 NOTE — Telephone Encounter (Signed)
Copied from [place] [phone]. Topic: General - Inquiry >> Jan 02, 2021  9:54 AM Greggory Keen D wrote: Reason for CRM: Pt has covid and would like something sent to the pharmacy for her cough.  No other symptoms  [phone]  [facility]

## 2021-01-02 NOTE — Telephone Encounter (Signed)
Please advise 

## 2021-01-02 NOTE — Telephone Encounter (Signed)
Patient was advised and made an appointment

## 2021-01-02 NOTE — Telephone Encounter (Signed)
Can try delsym or honey. Would need to be seen to recommend any Rx.  Also, note to other provider that this message did not need ot be forwarded to me.  I do not know this patient better than any other provider.

## 2021-01-03 ENCOUNTER — Telehealth (INDEPENDENT_AMBULATORY_CARE_PROVIDER_SITE_OTHER): Payer: Medicare Other | Admitting: Family Medicine

## 2021-01-03 ENCOUNTER — Encounter: Payer: Self-pay | Admitting: Family Medicine

## 2021-01-03 VITALS — Ht 62.0 in | Wt 189.0 lb

## 2021-01-03 DIAGNOSIS — U071 COVID-19: Secondary | ICD-10-CM | POA: Diagnosis not present

## 2021-01-03 MED ORDER — BENZONATATE 100 MG PO CAPS: 100.0000 mg | ORAL_CAPSULE | Freq: Two times a day (BID) | ORAL | 0 refills | Status: DC | PRN

## 2021-01-03 MED ORDER — FLUTICASONE PROPIONATE 50 MCG/ACT NA SUSP: 2.0000 | Freq: Every day | NASAL | 6 refills | Status: DC

## 2021-01-03 NOTE — Progress Notes (Signed)
MyChart Video Visit    Virtual Visit via Video Note   This visit type was conducted due to national recommendations for restrictions regarding the COVID-19 Pandemic (e.g. social distancing) in an effort to limit this patient's exposure and mitigate transmission in our community. This patient is at least at moderate risk for complications without adequate follow up. This format is felt to be most appropriate for this patient at this time. Physical exam was limited by quality of the video and audio technology used for the visit.    Patient location: home Provider location: [place] involved in the visit: patient, provider  I discussed the limitations of evaluation and management by telemedicine and the availability of in person appointments. The patient expressed understanding and agreed to proceed.  [name]   DOB: 11/02/1955   65 y.o. Female  MRN: [id number] Visit Date: 01/03/2021  Today's healthcare provider: Lavon Paganini, MD   No chief complaint on file.  Subjective    Cough This is a new problem. The current episode started in the past 7 days. The problem has been unchanged. The problem occurs constantly. The cough is Non-productive. Associated symptoms include postnasal drip. Pertinent negatives include no chest pain, chills, ear pain, fever, nasal congestion, rhinorrhea, sore throat, shortness of breath or wheezing. Nothing aggravates the symptoms. She has tried OTC cough suppressant for the symptoms. The treatment provided no relief.   Patient C/O cough x 8 days. Patient tested positive for COVID on 12/27/20, was started on Paxlovid on 12/29/20. Patient requesting prescription strength cough medication.   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   atorvastatin (LIPITOR) 20 MG tablet Take 1 tablet (20 mg total) by mouth daily.   ferrous sulfate 325 (65 FE) MG tablet TAKE 1 TABLET BY MOUTH EVERY DAY WITH BREAKFAST   hydrochlorothiazide (HYDRODIURIL) 25 MG tablet Take 1 tablet (25 mg total) by mouth 2 (two) times daily.   hydrOXYzine (ATARAX/VISTARIL) 25 MG tablet TAKE 1 TABLET BY MOUTH AT BEDTIME AS NEEDED.   ibuprofen (ADVIL) 800 MG tablet TAKE 1 TABLET (800 MG TOTAL) BY MOUTH EVERY EVENING.   montelukast (SINGULAIR) 10 MG tablet Take 1 tablet (10 mg total) by mouth daily.   triamcinolone cream (KENALOG) 0.1 % APPLY TO AFFECTED AREA TWICE A DAY (Patient taking differently: as needed.)   nirmatrelvir/ritonavir EUA (PAXLOVID) TABS Take 3 tablets  by mouth 2 (two) times daily for 5 days. (Take nirmatrelvir 150 mg two tablets twice daily for 5 days and ritonavir 100 mg one tablet twice daily for 5 days) Patient GFR is 101   [DISCONTINUED] fluconazole (DIFLUCAN) 150 MG tablet Take one tablet on day 1 and may  Repeat dose of 1 tablet  in 72 hrs if needed   No facility-administered medications prior to visit.    Review of Systems  Constitutional:  Negative for chills and  fever.  HENT:  Positive for postnasal drip. Negative for congestion, ear discharge, ear pain, rhinorrhea, sinus pressure, sinus pain and sore throat.   Respiratory:  Positive for cough. Negative for shortness of breath and wheezing.   Cardiovascular:  Negative for chest pain and palpitations.   Last metabolic panel Lab Results  Component Value Date   GLUCOSE 94 03/21/2020   NA 142 03/21/2020   K 3.8 03/21/2020   CL 103 03/21/2020   CO2 20 03/21/2020   BUN 14 03/21/2020   CREATININE 0.72 03/21/2020   GFRNONAA 88 03/21/2020   GFRAA 101 03/21/2020   CALCIUM 9.7 03/21/2020   PROT 7.5 03/21/2020   ALBUMIN 4.6 03/21/2020   LABGLOB 2.9 03/21/2020   AGRATIO 1.6 03/21/2020   BILITOT <0.2 03/21/2020   ALKPHOS 103 03/21/2020   AST 25 03/21/2020   ALT 20 03/21/2020   ANIONGAP 9 12/07/2018      Objective    Ht 5\' 2"  (1.575 m)   Wt 189 lb (85.7 kg)   BMI 34.57 kg/m  BP Readings from Last 3 Encounters:  09/05/20 97/66  03/21/20 (!) 114/50  01/25/20 106/62   Wt Readings from Last 3 Encounters:  01/03/21 189 lb (85.7 kg)  09/05/20 202 lb (91.6 kg)  03/21/20 222 lb 3.2 oz (100.8 kg)     Physical Exam Constitutional:      General: She is not in acute distress.    Appearance: Normal appearance. She is not diaphoretic.  HENT:     Head: Normocephalic and atraumatic.  Eyes:     Conjunctiva/sclera: Conjunctivae normal.  Pulmonary:     Effort: Pulmonary effort is normal. No respiratory distress.  Neurological:     Mental Status: She is alert and oriented to person, place, and time. Mental status is at baseline.  Psychiatric:        Mood and Affect: Mood normal.        Behavior: Behavior normal.       Assessment & Plan     1. COVID-19 - s/p paxlovid - now with lingering PND and cough - trial of flonase and tessalon - return precautions discussed  No follow-ups on file.     I discussed the assessment and treatment plan with the patient. The patient was provided an  opportunity to ask questions and all were answered. The patient agreed with the plan and demonstrated an understanding of the instructions.   The patient was advised to call back or seek an in-person evaluation if the symptoms worsen or if the condition fails to improve as anticipated.  I, Lavon Paganini, MD, have reviewed all documentation for this visit. The documentation on 01/03/21 for the exam, diagnosis, procedures, and orders are all accurate and complete.   Fritzie Prioleau, Dionne Bucy, MD, MPH [facility]

## 2021-01-30 ENCOUNTER — Ambulatory Visit: Payer: Medicare Other

## 2021-02-14 ENCOUNTER — Other Ambulatory Visit: Payer: Self-pay | Admitting: Physician Assistant

## 2021-02-14 ENCOUNTER — Other Ambulatory Visit: Payer: Self-pay | Admitting: Family Medicine

## 2021-02-14 DIAGNOSIS — R21 Rash and other nonspecific skin eruption: Secondary | ICD-10-CM

## 2021-02-14 DIAGNOSIS — M1710 Unilateral primary osteoarthritis, unspecified knee: Secondary | ICD-10-CM

## 2021-02-14 NOTE — Telephone Encounter (Signed)
Requested medications are due for refill today.  yes  Requested medications are on the active medications list. yeys  Last refill. 10/18/2020  Future visit scheduled.   yes  Notes to clinic.  PCP is Ms. Burnette

## 2021-02-21 IMAGING — CT CT ANGIOGRAPHY CHEST
3 of 7 series · 19 of 36 positions shown · IV contrast (APPLIED)
Comparison: Radiograph earlier this day.

CLINICAL DATA: Left-sided chest pain. Elevated D-dimer.

EXAM:
CT ANGIOGRAPHY CHEST WITH CONTRAST
TECHNIQUE: Multidetector CT imaging of the chest was performed using the
standard protocol during bolus administration of intravenous
contrast. Multiplanar CT image reconstructions and MIPs were
obtained to evaluate the vascular anatomy.
CONTRAST:  75mL OMNIPAQUE IOHEXOL 350 MG/ML SOLN

[Series 5: thins · axial · 0.65mm/px · z∈[-598,-359]mm · 15 of 275 slices shown]
[im 18/275  lung]
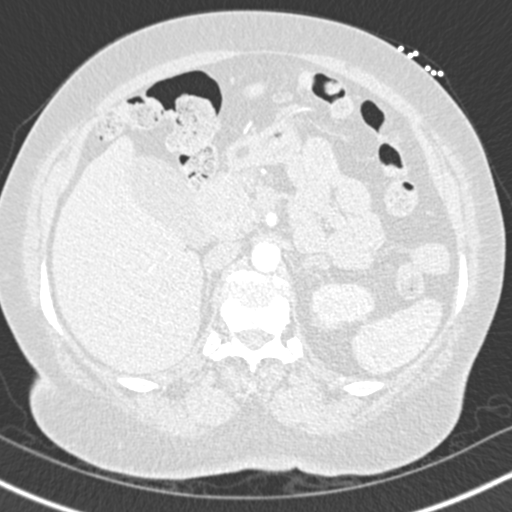
[im 35/275  mediastinal]
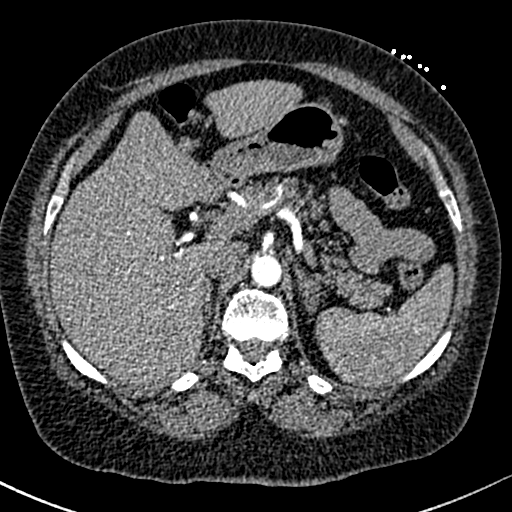
[im 52/275  lung]
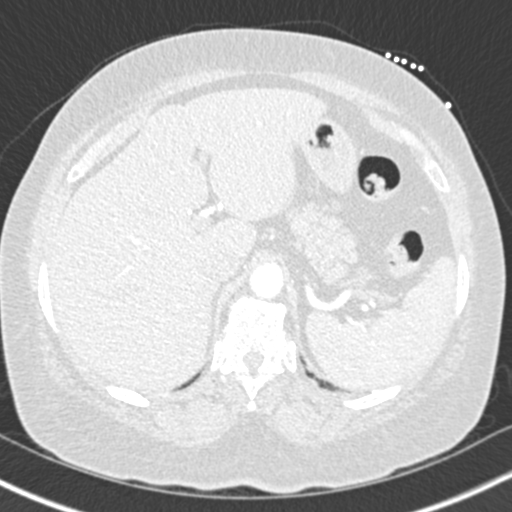
[im 69/275  mediastinal]
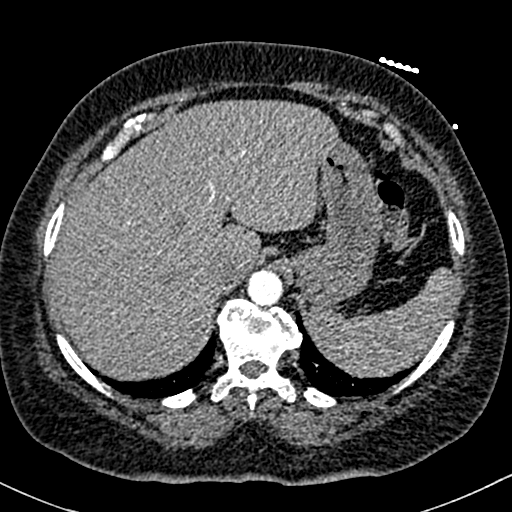
[im 86/275  lung]
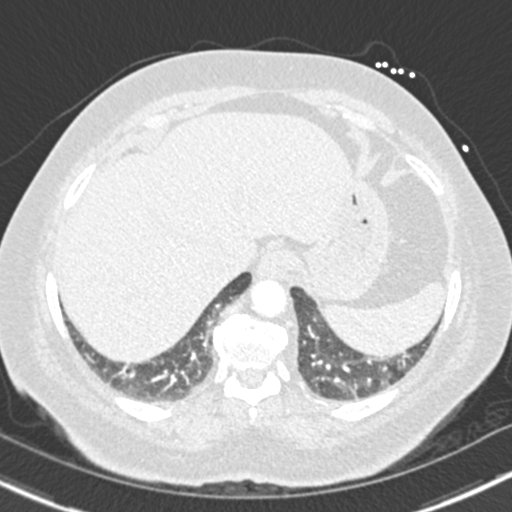
[im 103/275  mediastinal]
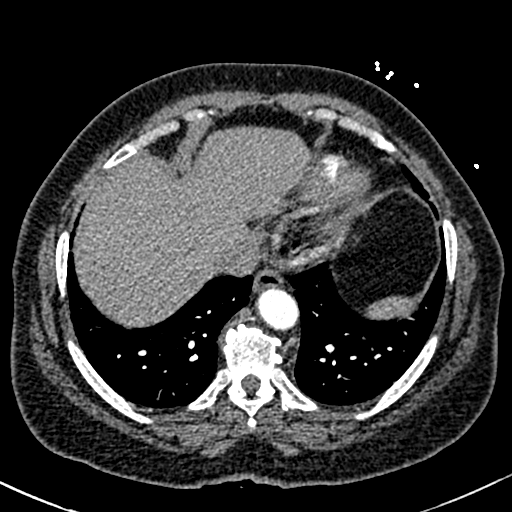
[im 120/275  lung]
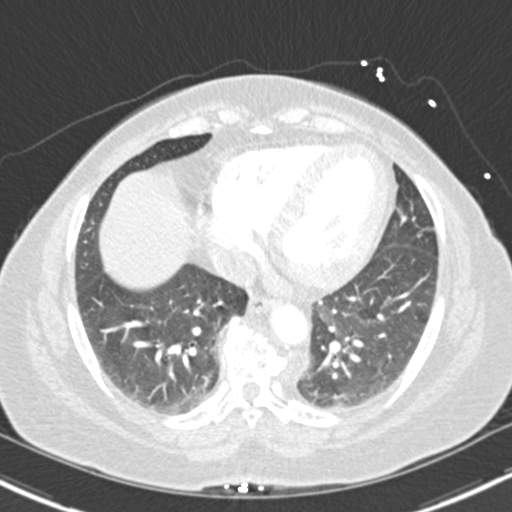
[im 138/275  mediastinal]
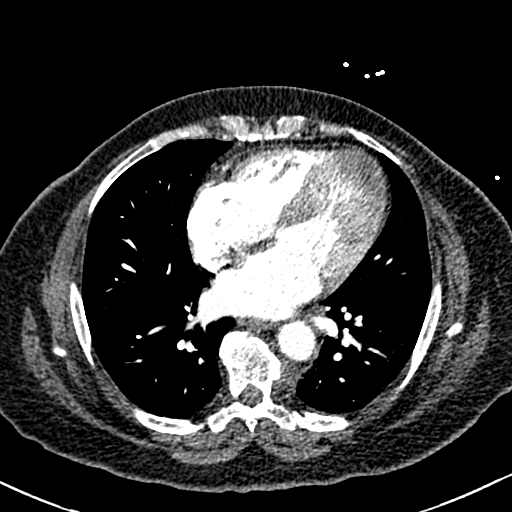
[im 155/275  lung]
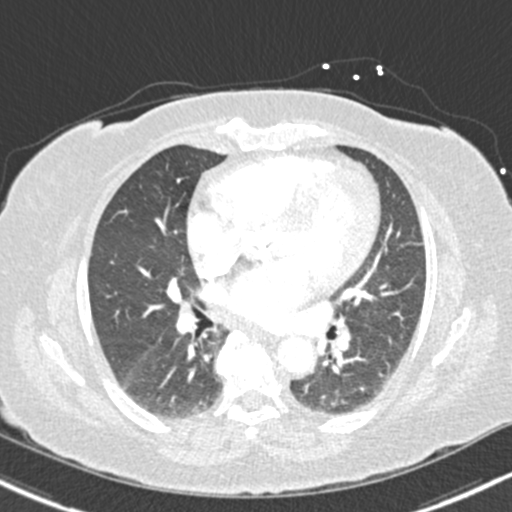
[im 172/275  mediastinal]
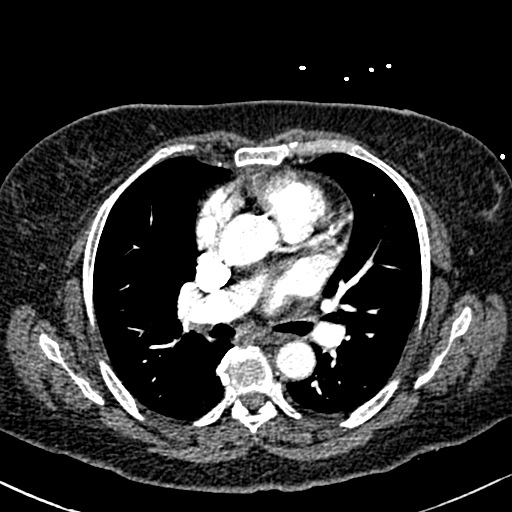
[im 189/275  lung]
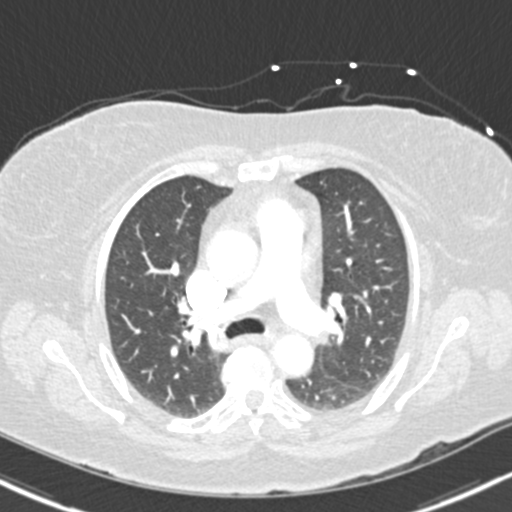
[im 206/275  mediastinal]
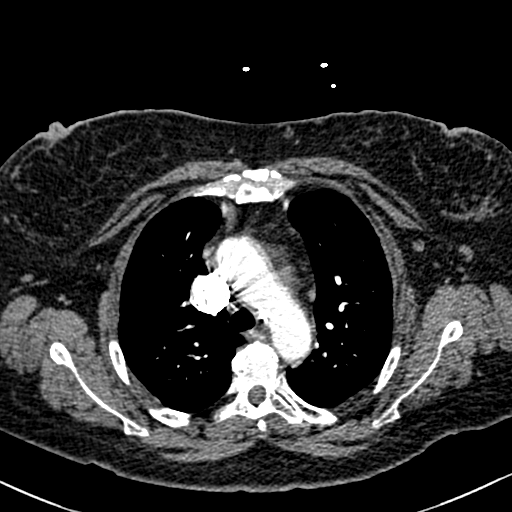
[im 223/275  lung]
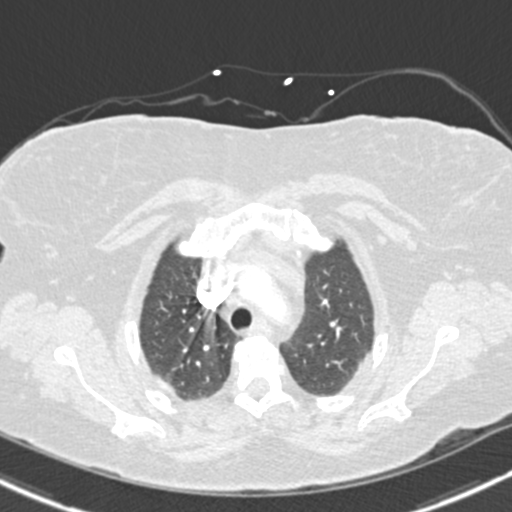
[im 240/275  mediastinal]
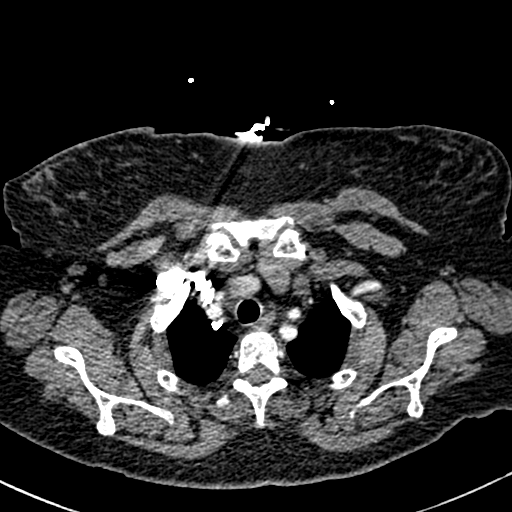
[im 257/275  lung]
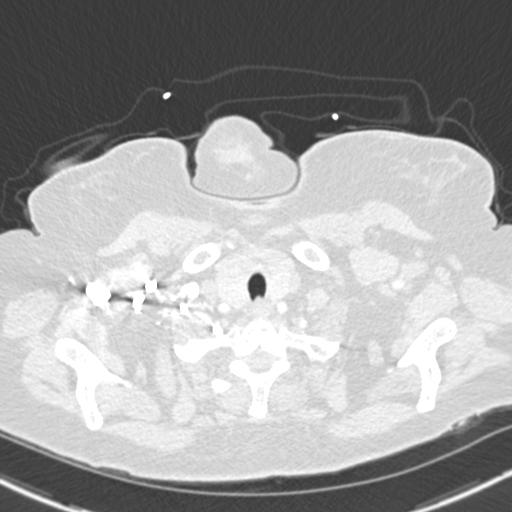

[Series 6: lung · axial · 0.65mm/px · z∈[-512,-398]mm · 3 of 77 slices shown]
[im 20/77  mediastinal]
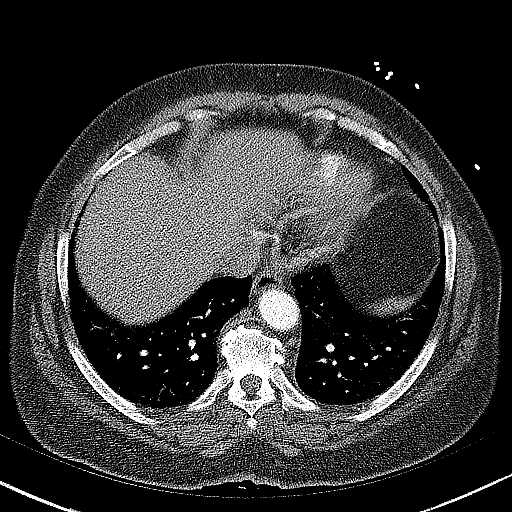
[im 39/77  mediastinal]
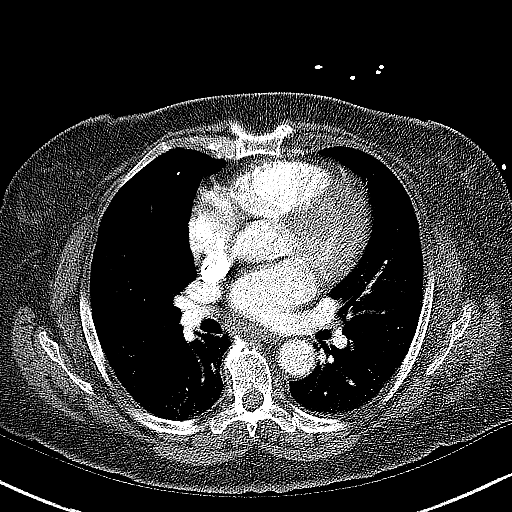
[im 58/77  mediastinal]
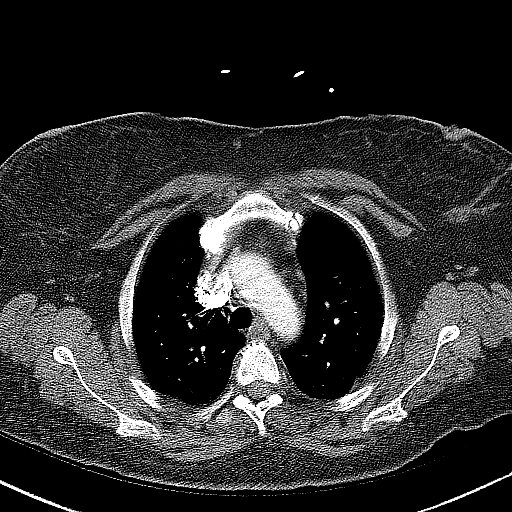

[Series 7: coronal mpr · coronal · 0.54mm/px · 1 of 106 slices shown]
[im 53/106  mediastinal]
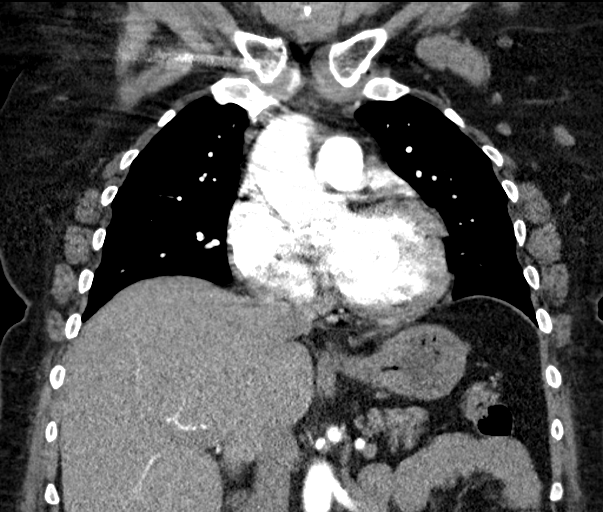

[19 of 36 positions shown; findings below may reference images not displayed]

FINDINGS: Cardiovascular: There are no filling defects within the pulmonary
arteries to suggest pulmonary embolus. The thoracic aorta is normal
in caliber without dissection. Mild atherosclerosis. Bovine arch
configuration. Normal heart size without pericardial effusion.

Mediastinum/Nodes: Heterogeneous thyroid gland with probable 15 mm
nodule in the left lobe. Multiple small mediastinal nodes all
subcentimeter. No hilar adenopathy. Small bilateral axillary nodes
not enlarged by size criteria. Decompressed esophagus.

Lungs/Pleura: Mild hypoventilatory change in the bases. Lungs are
otherwise clear. No focal airspace disease. No pulmonary edema. No
pleural fluid. No pulmonary mass.

Upper Abdomen: No acute abnormality.

Musculoskeletal: There are no acute or suspicious osseous
abnormalities.

Review of the MIP images confirms the above findings.
IMPRESSION: 1. No pulmonary embolus or acute intrathoracic abnormality.
2. Heterogeneous thyroid gland with possible 15 mm left thyroid
nodule. Recommend further evaluation with thyroid ultrasound on a
nonemergent basis.

## 2021-03-28 ENCOUNTER — Ambulatory Visit (INDEPENDENT_AMBULATORY_CARE_PROVIDER_SITE_OTHER): Payer: Medicare Other | Admitting: Family Medicine

## 2021-03-28 ENCOUNTER — Other Ambulatory Visit: Payer: Self-pay

## 2021-03-28 ENCOUNTER — Encounter: Payer: Self-pay | Admitting: Family Medicine

## 2021-03-28 VITALS — BP 107/51 | HR 72 | Temp 98.5°F | Resp 16 | Ht 62.0 in | Wt 198.0 lb

## 2021-03-28 DIAGNOSIS — R011 Cardiac murmur, unspecified: Secondary | ICD-10-CM

## 2021-03-28 DIAGNOSIS — E559 Vitamin D deficiency, unspecified: Secondary | ICD-10-CM

## 2021-03-28 DIAGNOSIS — R21 Rash and other nonspecific skin eruption: Secondary | ICD-10-CM | POA: Diagnosis not present

## 2021-03-28 DIAGNOSIS — Z0000 Encounter for general adult medical examination without abnormal findings: Secondary | ICD-10-CM

## 2021-03-28 DIAGNOSIS — M1710 Unilateral primary osteoarthritis, unspecified knee: Secondary | ICD-10-CM | POA: Diagnosis not present

## 2021-03-28 DIAGNOSIS — E611 Iron deficiency: Secondary | ICD-10-CM

## 2021-03-28 DIAGNOSIS — Z720 Tobacco use: Secondary | ICD-10-CM

## 2021-03-28 DIAGNOSIS — Z1211 Encounter for screening for malignant neoplasm of colon: Secondary | ICD-10-CM | POA: Diagnosis not present

## 2021-03-28 DIAGNOSIS — R600 Localized edema: Secondary | ICD-10-CM

## 2021-03-28 DIAGNOSIS — J301 Allergic rhinitis due to pollen: Secondary | ICD-10-CM | POA: Diagnosis not present

## 2021-03-28 DIAGNOSIS — Z1231 Encounter for screening mammogram for malignant neoplasm of breast: Secondary | ICD-10-CM | POA: Diagnosis not present

## 2021-03-28 DIAGNOSIS — E7800 Pure hypercholesterolemia, unspecified: Secondary | ICD-10-CM | POA: Diagnosis not present

## 2021-03-28 DIAGNOSIS — Z6841 Body Mass Index (BMI) 40.0 and over, adult: Secondary | ICD-10-CM

## 2021-03-28 DIAGNOSIS — L299 Pruritus, unspecified: Secondary | ICD-10-CM

## 2021-03-28 MED ORDER — MONTELUKAST SODIUM 10 MG PO TABS: 10.0000 mg | ORAL_TABLET | Freq: Every day | ORAL | 3 refills | Status: DC

## 2021-03-28 MED ORDER — HYDROXYZINE HCL 25 MG PO TABS: 25.0000 mg | ORAL_TABLET | Freq: Every evening | ORAL | 1 refills | Status: DC | PRN

## 2021-03-28 MED ORDER — BENZONATATE 100 MG PO CAPS: 100.0000 mg | ORAL_CAPSULE | Freq: Two times a day (BID) | ORAL | 0 refills | Status: DC | PRN

## 2021-03-28 MED ORDER — FLUTICASONE PROPIONATE 50 MCG/ACT NA SUSP: 2.0000 | Freq: Every day | NASAL | 6 refills | Status: DC

## 2021-03-28 MED ORDER — ATORVASTATIN CALCIUM 20 MG PO TABS: 20.0000 mg | ORAL_TABLET | Freq: Every day | ORAL | 3 refills | Status: DC

## 2021-03-28 MED ORDER — IBUPROFEN 800 MG PO TABS: 800.0000 mg | ORAL_TABLET | Freq: Every evening | ORAL | 1 refills | Status: DC

## 2021-03-28 MED ORDER — FERROUS SULFATE 325 (65 FE) MG PO TABS: 325.0000 mg | ORAL_TABLET | Freq: Every day | ORAL | 3 refills | Status: DC

## 2021-03-28 MED ORDER — TRIAMCINOLONE ACETONIDE 0.1 % EX CREA: TOPICAL_CREAM | CUTANEOUS | 1 refills | Status: DC

## 2021-03-28 NOTE — Assessment & Plan Note (Signed)
Occasional cough due to post-nasal drip Refill tessalon Continue singulair, flonase Add antihistamine

## 2021-03-28 NOTE — Assessment & Plan Note (Signed)
Reviewed last lipid panel Not currently on a statin Recheck FLP and CMP Discussed diet and exercise  

## 2021-03-28 NOTE — Assessment & Plan Note (Signed)
Continue supplement Recheck level 

## 2021-03-28 NOTE — Assessment & Plan Note (Signed)
>>  ASSESSMENT AND PLAN FOR HYPERCHOLESTEROLEMIA WITHOUT HYPERTRIGLYCERIDEMIA WRITTEN ON 03/28/2021  9:29 AM BY Erasmo Downer, MD  Reviewed last lipid panel Not currently on a statin Recheck FLP and CMP Discussed diet and exercise

## 2021-03-28 NOTE — Assessment & Plan Note (Signed)
Chronic and stable Asymptomatic Never had Echo

## 2021-03-28 NOTE — Patient Instructions (Addendum)
Check with your insurance to see if they will cover the shingles shot.   Check at the pharmacy about COVID, tetanus, and shingles vaccines

## 2021-03-28 NOTE — Assessment & Plan Note (Signed)
Discussed importance of healthy weight management Discussed diet and exercise  

## 2021-03-28 NOTE — Progress Notes (Signed)
Annual Wellness Visit     [name], Female    DOB: [birth year], 65 y.o.   MRN: [id number] Visit Date: 03/28/2021  Today's Provider: Lavon Paganini, MD   Chief Complaint  Patient presents with   Medicare Wellness    Subjective    Cathy Townsend is a 65 y.o. female who presents today for her Annual Wellness Visit. She reports consuming a general diet. Home exercise routine includes stretching. She generally feels well. She reports sleeping fairly well. She does not have additional problems to discuss today.   HPI    Medications: Outpatient Medications Prior to Visit  Medication Sig   aspirin EC 81 MG tablet Take 81 mg by mouth.   [DISCONTINUED] atorvastatin (LIPITOR) 20 MG tablet Take 1 tablet (20 mg total) by mouth daily.   [DISCONTINUED] benzonatate (TESSALON) 100 MG capsule Take 1-2 capsules (100-200 mg total) by mouth 2 (two) times daily as needed for cough.   [DISCONTINUED] ferrous sulfate 325 (65 FE) MG tablet TAKE 1 TABLET BY MOUTH EVERY DAY WITH BREAKFAST   [DISCONTINUED] fluticasone (FLONASE) 50 MCG/ACT nasal spray Place 2 sprays into both nostrils daily.   [DISCONTINUED] hydrochlorothiazide (HYDRODIURIL) 25 MG tablet Take 1 tablet (25 mg total) by mouth 2 (two) times daily.   [DISCONTINUED] hydrOXYzine (ATARAX/VISTARIL) 25 MG tablet TAKE 1 TABLET BY MOUTH AT BEDTIME AS NEEDED.   [DISCONTINUED] ibuprofen (ADVIL) 800 MG tablet TAKE 1 TABLET (800 MG TOTAL) BY MOUTH EVERY EVENING.   [DISCONTINUED] montelukast (SINGULAIR) 10 MG tablet Take 1 tablet (10 mg total) by mouth daily.   [DISCONTINUED] triamcinolone cream (KENALOG) 0.1 % APPLY TO AFFECTED AREA TWICE A DAY   No facility-administered medications prior to visit.    Allergies  Allergen Reactions   Oxycodone-Acetaminophen Itching   Tramadol Itching   Meloxicam Rash    Patient Care Team: [name], PA-C as PCP - General (Physician Assistant) Odette Fraction  (Optometry) Jannet Mantis, MD (Dermatology) Brita Romp Dionne Bucy, MD as Consulting Physician (Family Medicine)  Review of Systems  Constitutional: Negative.   HENT:  Positive for congestion and sneezing.   Eyes:  Positive for itching.  Respiratory:  Positive for cough.   Cardiovascular: Negative.   Gastrointestinal: Negative.   Endocrine: Negative.   Genitourinary: Negative.   Musculoskeletal: Negative.   Skin: Negative.   Allergic/Immunologic: Negative.   Neurological: Negative.   Hematological: Negative.   Psychiatric/Behavioral: Negative.     Last CBC Lab Results  Component Value Date   WBC 4.5 03/21/2020   HGB 12.2 03/21/2020   HCT 37.9 03/21/2020   MCV 92 03/21/2020   MCH 29.8 03/21/2020   RDW 13.0 03/21/2020   PLT 398 29/79/8921   Last metabolic panel Lab Results  Component Value Date   GLUCOSE 94 03/21/2020   NA 142 03/21/2020   K 3.8 03/21/2020   CL 103 03/21/2020   CO2 20 03/21/2020   BUN 14 03/21/2020   CREATININE 0.72 03/21/2020   GFRNONAA 88 03/21/2020   CALCIUM 9.7 03/21/2020   PROT 7.5 03/21/2020   ALBUMIN 4.6 03/21/2020   LABGLOB 2.9 03/21/2020   AGRATIO 1.6 03/21/2020   BILITOT <0.2 03/21/2020   ALKPHOS 103 03/21/2020   AST 25 03/21/2020   ALT 20 03/21/2020   ANIONGAP 9 12/07/2018   Last lipids Lab Results  Component Value Date   CHOL 215 (H) 03/21/2020   HDL 62 03/21/2020   LDLCALC 137 (H) 03/21/2020   TRIG 90 03/21/2020  CHOLHDL 4.4 07/07/2018   Last hemoglobin A1c Lab Results  Component Value Date   HGBA1C 5.6 03/21/2020   Last thyroid functions Lab Results  Component Value Date   TSH 0.658 03/21/2020   Last vitamin D Lab Results  Component Value Date   VD25OH 26.9 (L) 07/07/2018       Objective    Vitals: BP (!) 107/51 (BP Location: Right Arm, Patient Position: Sitting, Cuff Size: Large)   Pulse 72   Temp 98.5 F (36.9 C) (Oral)   Resp 16   Ht 5\' 2"  (1.575 m)   Wt 198 lb (89.8 kg)   SpO2 100%   BMI  36.21 kg/m  BP Readings from Last 3 Encounters:  03/28/21 (!) 107/51  09/05/20 97/66  03/21/20 (!) 114/50   Wt Readings from Last 3 Encounters:  03/28/21 198 lb (89.8 kg)  01/03/21 189 lb (85.7 kg)  09/05/20 202 lb (91.6 kg)      Physical Exam Vitals reviewed.  Constitutional:      General: She is not in acute distress.    Appearance: Normal appearance. She is well-developed. She is not diaphoretic.  HENT:     Head: Normocephalic and atraumatic.     Right Ear: Tympanic membrane, ear canal and external ear normal.     Left Ear: Tympanic membrane, ear canal and external ear normal.     Nose: Nose normal.     Mouth/Throat:     Mouth: Mucous membranes are moist.     Pharynx: Oropharynx is clear. No oropharyngeal exudate.  Eyes:     General: No scleral icterus.    Conjunctiva/sclera: Conjunctivae normal.     Pupils: Pupils are equal, round, and reactive to light.  Neck:     Thyroid: No thyromegaly.  Cardiovascular:     Rate and Rhythm: Normal rate and regular rhythm.     Pulses: Normal pulses.     Heart sounds: Murmur heard.  Pulmonary:     Effort: Pulmonary effort is normal. No respiratory distress.     Breath sounds: Normal breath sounds. No wheezing or rales.  Chest:     Comments: Breasts: breasts appear normal, no suspicious masses, no skin or nipple changes or axillary nodes  Abdominal:     General: There is no distension.     Palpations: Abdomen is soft.     Tenderness: There is no abdominal tenderness. There is no guarding or rebound.  Musculoskeletal:        General: No deformity.     Cervical back: Neck supple.     Right lower leg: No edema.     Left lower leg: No edema.  Lymphadenopathy:     Cervical: No cervical adenopathy.  Skin:    General: Skin is warm and dry.     Capillary Refill: Capillary refill takes less than 2 seconds.     Findings: No rash.  Neurological:     Mental Status: She is alert and oriented to person, place, and time. Mental status  is at baseline.     Sensory: No sensory deficit.     Motor: No weakness.     Gait: Gait normal.  Psychiatric:        Mood and Affect: Mood normal.        Behavior: Behavior normal.        Thought Content: Thought content normal.     Most recent functional status assessment: In your present state of health, do you have any difficulty performing the  following activities: 03/28/2021  Hearing? N  Vision? N  Difficulty concentrating or making decisions? N  Walking or climbing stairs? N  Dressing or bathing? N  Doing errands, shopping? N  Some recent data might be hidden   Most recent fall risk assessment: Fall Risk  03/28/2021  Falls in the past year? 0  Number falls in past yr: 0  Injury with Fall? 0  Risk for fall due to : No Fall Risks  Follow up Falls evaluation completed    Most recent depression screenings: PHQ 2/9 Scores 03/28/2021 09/05/2020  PHQ - 2 Score 0 0  PHQ- 9 Score 0 0  Exception Documentation - -   Most recent cognitive screening: 6CIT Screen 03/28/2021  What Year? 0 points  What month? 0 points  What time? 0 points  Count back from 20 2 points  Months in reverse 4 points  Repeat phrase 0 points  Total Score 6   Most recent Audit-C alcohol use screening Alcohol Use Disorder Test (AUDIT) 03/28/2021  1. How often do you have a drink containing alcohol? 4  2. How many drinks containing alcohol do you have on a typical day when you are drinking? 3  3. How often do you have six or more drinks on one occasion? 3  AUDIT-C Score 10  4. How often during the last year have you found that you were not able to stop drinking once you had started? -  5. How often during the last year have you failed to do what was normally expected from you because of drinking? -  6. How often during the last year have you needed a first drink in the morning to get yourself going after a heavy drinking session? -  7. How often during the last year have you had a feeling of guilt of  remorse after drinking? -  8. How often during the last year have you been unable to remember what happened the night before because you had been drinking? -  9. Have you or someone else been injured as a result of your drinking? -  10. Has a relative or friend or a doctor or another health worker been concerned about your drinking or suggested you cut down? -  Alcohol Use Disorder Identification Test Final Score (AUDIT) -  Alcohol Brief Interventions/Follow-up -   A score of 3 or more in women, and 4 or more in men indicates increased risk for alcohol abuse, EXCEPT if all of the points are from question 1   No results found for any visits on 03/28/21.  Assessment & Plan     Annual wellness visit done today including the all of the following: Reviewed patient's Family Medical History Reviewed and updated list of patient's medical providers Assessment of cognitive impairment was done Assessed patient's functional ability Established a written schedule for health screening [place] Completed and Reviewed  Exercise Activities and Dietary recommendations  Goals      Weight (lb) < 200 lb (90.7 kg)     Recommend to cut back on sugar in daily diet to help with weight loss.         Immunization History  Administered Date(s) Administered   Fluad Quad(high Dose 65+) 03/31/2019   Influenza,inj,Quad PF,6+ Mos 05/20/2017, 05/26/2018   Influenza-Unspecified 03/14/2021   Moderna Sars-Covid-2 Vaccination 08/01/2019, 08/29/2019, 04/11/2020   Pneumococcal Conjugate-13 01/25/2020   Pneumococcal Polysaccharide-23 04/06/2014   Td 05/28/2002   Tdap 11/09/2009   Zoster, Live 12/31/2013  Health Maintenance  Topic Date Due   Zoster Vaccines- Shingrix (1 of 2) Never done   COLONOSCOPY (Pts 45-85yrs Insurance coverage will need to be confirmed)  07/13/2017   MAMMOGRAM  09/06/2018   TETANUS/TDAP  11/10/2019   COVID-19 Vaccine (4 - Booster for Moderna series) 08/12/2020    INFLUENZA VACCINE  Completed   DEXA SCAN  Completed   Hepatitis C Screening  Completed   HPV VACCINES  Aged Out     Discussed health benefits of physical activity, and encouraged her to engage in regular exercise appropriate for her age and condition.    Problem List Items Addressed This Visit       Respiratory   Allergic rhinitis    Occasional cough due to post-nasal drip Refill tessalon Continue singulair, flonase Add antihistamine      Relevant Medications   montelukast (SINGULAIR) 10 MG tablet     Musculoskeletal and Integument   Primary osteoarthritis of knee   Relevant Medications   ibuprofen (ADVIL) 800 MG tablet     Other   Cardiac murmur    Chronic and stable Asymptomatic Never had Echo      Adiposity    Discussed importance of healthy weight management Discussed diet and exercise       Hypercholesterolemia without hypertriglyceridemia    Reviewed last lipid panel Not currently on a statin Recheck FLP and CMP Discussed diet and exercise       Relevant Medications   atorvastatin (LIPITOR) 20 MG tablet   Other Relevant Orders   Lipid Panel With LDL/HDL Ratio   Avitaminosis D    Continue supplement Recheck level       Relevant Orders   VITAMIN D 25 Hydroxy (Vit-D Deficiency, Fractures)   Tobacco use    Patient is currently precontemplative  Will reassess at next visit  Declines lung cancer screening      Edema    Chronic and stable Low BP today Taking HCTZ 25mg  BID, wihtout improvement in edema Will stop Discussed leg elevation, compression socks If recurs in setting of murmur consider Echo and loop diuretic      Other Visit Diagnoses     Encounter for Medicare annual wellness exam    -  Primary   Relevant Orders   Comprehensive metabolic panel   Lipid Panel With LDL/HDL Ratio   Encounter for annual physical exam       Iron deficiency       Relevant Medications   ferrous sulfate 325 (65 FE) MG tablet   Other Relevant  Orders   CBC with Differential/Platelet   Iron, TIBC and Ferritin Panel   Encounter for screening mammogram for malignant neoplasm of breast       Colon cancer screening       Hypercholesterolemia       Relevant Medications   atorvastatin (LIPITOR) 20 MG tablet   Rash       Relevant Medications   hydrOXYzine (ATARAX/VISTARIL) 25 MG tablet   triamcinolone cream (KENALOG) 0.1 %   Pruritus       Relevant Medications   hydrOXYzine (ATARAX/VISTARIL) 25 MG tablet       declines lung cancer, mammogram, colon cancer screening  Return in about 1 year (around 03/28/2022) for AWV, CPE, With new PCP.     I, Lavon Paganini, MD, have reviewed all documentation for this visit. The documentation on 03/28/21 for the exam, diagnosis, procedures, and orders are all accurate and complete.   Virginia Crews, MD,  [facility]

## 2021-03-28 NOTE — Assessment & Plan Note (Addendum)
Chronic and stable Low BP today Taking HCTZ 25mg  BID, wihtout improvement in edema Will stop Discussed leg elevation, compression socks If recurs in setting of murmur consider Echo and loop diuretic

## 2021-03-28 NOTE — Assessment & Plan Note (Signed)
Patient is currently precontemplative  Will reassess at next visit  Declines lung cancer screening

## 2021-05-24 ENCOUNTER — Ambulatory Visit: Payer: Self-pay

## 2021-05-24 NOTE — Telephone Encounter (Signed)
Pt called in stating her ankles are swollen. She reports being taken off her fluid pill and since then for about 1-2 weeks now her ankles have been swelling. She denies pain to feet or ankles and states swelling doesn't go above the ankles. Appt scheduled for 05/29/21 at 1340 with Dr. Ky Barban d/t pts preference in appts. Care advice given and pt verbalized understanding. No other questions/concerns noted.     Reason for Disposition  [1] MODERATE pain (e.g., interferes with normal activities, limping) AND [2] present > 3 days  Answer Assessment - Initial Assessment Questions 1. LOCATION: "Which ankle is swollen?" "Where is the swelling?"     Both ankles 2. ONSET: "When did the swelling start?"     1-2 weeks 3. SIZE: "How large is the swelling?"     Finger indentions 4. PAIN: "Is there any pain?" If Yes, ask: "How bad is it?" (Scale 1-10; or mild, moderate, severe)   - NONE (0): no pain.   - MILD (1-3): doesn't interfere with normal activities.    - MODERATE (4-7): interferes with normal activities (e.g., work or school) or awakens from sleep, limping.    - SEVERE (8-10): excruciating pain, unable to do any normal activities, unable to walk.      no 5. CAUSE: "What do you think caused the ankle swelling?"     Was taken off fluid pill 6. OTHER SYMPTOMS: "Do you have any other symptoms?" (e.g., fever, chest pain, difficulty breathing, calf pain)     No 7. PREGNANCY: "Is there any chance you are pregnant?" "When was your last menstrual period?"     No  Protocols used: Ankle Swelling-A-AH

## 2021-05-29 ENCOUNTER — Encounter: Payer: Self-pay | Admitting: Family Medicine

## 2021-05-29 ENCOUNTER — Other Ambulatory Visit: Payer: Self-pay

## 2021-05-29 ENCOUNTER — Ambulatory Visit (INDEPENDENT_AMBULATORY_CARE_PROVIDER_SITE_OTHER): Payer: Medicare Other | Admitting: Family Medicine

## 2021-05-29 VITALS — BP 95/61 | HR 78 | Temp 98.3°F | Resp 16 | Ht 62.0 in | Wt 203.0 lb

## 2021-05-29 DIAGNOSIS — E7800 Pure hypercholesterolemia, unspecified: Secondary | ICD-10-CM | POA: Diagnosis not present

## 2021-05-29 DIAGNOSIS — E559 Vitamin D deficiency, unspecified: Secondary | ICD-10-CM | POA: Diagnosis not present

## 2021-05-29 DIAGNOSIS — R600 Localized edema: Secondary | ICD-10-CM

## 2021-05-29 DIAGNOSIS — E611 Iron deficiency: Secondary | ICD-10-CM | POA: Diagnosis not present

## 2021-05-29 DIAGNOSIS — Z0000 Encounter for general adult medical examination without abnormal findings: Secondary | ICD-10-CM | POA: Diagnosis not present

## 2021-05-29 MED ORDER — FUROSEMIDE 20 MG PO TABS: 20.0000 mg | ORAL_TABLET | Freq: Every day | ORAL | 0 refills | Status: DC | PRN

## 2021-05-29 NOTE — Patient Instructions (Signed)
It was great to see you!  Our plans for today:  - Try compression socks for your swelling and elevate your legs when sitting.  - Take the furosemide only as needed.   We are checking some labs today, we will call you with these results.   Take care and seek immediate care sooner if you develop any concerns.   Dr. Ky Barban

## 2021-05-29 NOTE — Progress Notes (Signed)
   SUBJECTIVE:   CHIEF COMPLAINT / HPI:   LEG SWELLING - chronic issue, to b/l ankles - noticing worsening since discontinuing HCTZ - known cardiac murmur, reports having ECHO years ago. Not interested in repeat ECHO (no report available) - elevates legs at night, about an hour, not sure if helping. - refuses compression stockings.  - denies alleviating or aggravating symptoms.  - no new meds - denies chest pain, trouble breathing, recent immobility, abdomen swelling, bloody or dark BM, trouble sleeping - no h/o blood clots   OBJECTIVE:   BP 95/61 (BP Location: Right Arm, Patient Position: Sitting, Cuff Size: Large)   Pulse 78   Temp 98.3 F (36.8 C) (Temporal)   Resp 16   Ht 5\' 2"  (1.575 m)   Wt 203 lb (92.1 kg)   SpO2 99%   BMI 37.13 kg/m   Gen: well appearing, in NAD Card: RRR, systolic murmur Lungs: CTAB, no rales/wheeze Ext: WWP, 1+ pitting edema to shins b/l   ASSESSMENT/PLAN:   Edema Chronic, reports worsening though not using compression socks. No signs to concern for clot. Discussed ECHO given murmur but patient adamantly refusing. Also refusing compression. Encouraged elevation whenever possible. Obtaining labs.      Myles Gip, DO

## 2021-05-29 NOTE — Assessment & Plan Note (Addendum)
Chronic, reports worsening though not using compression socks. No signs to concern for clot. Discussed ECHO given murmur but patient adamantly refusing. Also refusing compression. Encouraged elevation whenever possible. Obtaining labs.

## 2021-05-30 ENCOUNTER — Telehealth: Payer: Self-pay

## 2021-05-30 DIAGNOSIS — D649 Anemia, unspecified: Secondary | ICD-10-CM

## 2021-05-30 LAB — CBC WITH DIFFERENTIAL/PLATELET
Basophils Absolute: 0 10*3/uL (ref 0.0–0.2)
Basos: 1 %
EOS (ABSOLUTE): 0.2 10*3/uL (ref 0.0–0.4)
Eos: 4 %
Hematocrit: 30.9 % — ABNORMAL LOW (ref 34.0–46.6)
Hemoglobin: 9.8 g/dL — ABNORMAL LOW (ref 11.1–15.9)
Immature Grans (Abs): 0 10*3/uL (ref 0.0–0.1)
Immature Granulocytes: 0 %
Lymphocytes Absolute: 1.3 10*3/uL (ref 0.7–3.1)
Lymphs: 32 %
MCH: 29 pg (ref 26.6–33.0)
MCHC: 31.7 g/dL (ref 31.5–35.7)
MCV: 91 fL (ref 79–97)
Monocytes Absolute: 0.6 10*3/uL (ref 0.1–0.9)
Monocytes: 14 %
Neutrophils Absolute: 1.9 10*3/uL (ref 1.4–7.0)
Neutrophils: 49 %
Platelets: 360 10*3/uL (ref 150–450)
RBC: 3.38 x10E6/uL — ABNORMAL LOW (ref 3.77–5.28)
RDW: 12.5 % (ref 11.7–15.4)
WBC: 4 10*3/uL (ref 3.4–10.8)

## 2021-05-30 LAB — COMPREHENSIVE METABOLIC PANEL
ALT: 13 IU/L (ref 0–32)
AST: 18 IU/L (ref 0–40)
Albumin/Globulin Ratio: 1.7 (ref 1.2–2.2)
Albumin: 4.2 g/dL (ref 3.8–4.8)
Alkaline Phosphatase: 76 IU/L (ref 44–121)
BUN/Creatinine Ratio: 18 (ref 12–28)
BUN: 13 mg/dL (ref 8–27)
Bilirubin Total: <0.2 mg/dL (ref 0.0–1.2)
CO2: 25 mmol/L (ref 20–29)
Calcium: 9.7 mg/dL (ref 8.7–10.3)
Chloride: 108 mmol/L — ABNORMAL HIGH (ref 96–106)
Creatinine, Ser: 0.72 mg/dL (ref 0.57–1.00)
Globulin, Total: 2.5 g/dL (ref 1.5–4.5)
Glucose: 91 mg/dL (ref 70–99)
Potassium: 4.2 mmol/L (ref 3.5–5.2)
Sodium: 146 mmol/L — ABNORMAL HIGH (ref 134–144)
Total Protein: 6.7 g/dL (ref 6.0–8.5)
eGFR: 92 mL/min/{1.73_m2} (ref >59)

## 2021-05-30 LAB — LIPID PANEL WITH LDL/HDL RATIO
Cholesterol, Total: 164 mg/dL (ref 100–199)
HDL: 60 mg/dL (ref >39)
LDL Chol Calc (NIH): 74 mg/dL (ref 0–99)
LDL/HDL Ratio: 1.2 ratio (ref 0.0–3.2)
Triglycerides: 178 mg/dL — ABNORMAL HIGH (ref 0–149)
VLDL Cholesterol Cal: 30 mg/dL (ref 5–40)

## 2021-05-30 LAB — IRON,TIBC AND FERRITIN PANEL
Ferritin: 27 ng/mL (ref 15–150)
Iron Saturation: 13 % — ABNORMAL LOW (ref 15–55)
Iron: 47 ug/dL (ref 27–139)
Total Iron Binding Capacity: 356 ug/dL (ref 250–450)
UIBC: 309 ug/dL (ref 118–369)

## 2021-05-30 LAB — BRAIN NATRIURETIC PEPTIDE: BNP: 80.2 pg/mL (ref 0.0–100.0)

## 2021-05-30 LAB — VITAMIN D 25 HYDROXY (VIT D DEFICIENCY, FRACTURES): Vit D, 25-Hydroxy: 17.6 ng/mL — ABNORMAL LOW (ref 30.0–100.0)

## 2021-05-30 NOTE — Telephone Encounter (Signed)
-----   Message from Cathy Crews, MD sent at 05/30/2021  8:09 AM EST ----- Normal/stable labs, except some signs of dehydration. Be sure to hydrate well. Vit D level is low.  Recommend starting OTC Vit D3 1000-2000 units daily. Good improvement in cholesterol. Worsening anemia. Needs GI evaluation. She was supposed to get repeat colonoscopy in 2019.

## 2021-05-30 NOTE — Telephone Encounter (Signed)
Patient advised. Per patient she will go see GI only for her anemia but doesn't want a colonoscopy.

## 2021-07-25 ENCOUNTER — Other Ambulatory Visit: Payer: Self-pay | Admitting: Family Medicine

## 2021-07-25 DIAGNOSIS — M1710 Unilateral primary osteoarthritis, unspecified knee: Secondary | ICD-10-CM

## 2021-07-26 NOTE — Telephone Encounter (Signed)
Requesting early. Requested Prescriptions  Pending Prescriptions Disp Refills   ibuprofen (ADVIL) 800 MG tablet [Pharmacy Med Name: IBUPROFEN 800 MG TABLET] 90 tablet 1    Sig: TAKE 1 TABLET (800 MG TOTAL) BY MOUTH EVERY EVENING.     Analgesics:  NSAIDS Failed - 07/25/2021  6:42 PM      Failed - Manual Review: Labs are only required if the patient has taken medication for more than 8 weeks.      Failed - HGB in normal range and within 360 days    Hemoglobin  Date Value Ref Range Status  05/29/2021 9.8 (L) 11.1 - 15.9 g/dL Final         Failed - HCT in normal range and within 360 days    Hematocrit  Date Value Ref Range Status  05/29/2021 30.9 (L) 34.0 - 46.6 % Final         Passed - Cr in normal range and within 360 days    Creatinine, Ser  Date Value Ref Range Status  05/29/2021 0.72 0.57 - 1.00 mg/dL Final         Passed - PLT in normal range and within 360 days    Platelets  Date Value Ref Range Status  05/29/2021 360 150 - 450 x10E3/uL Final         Passed - eGFR is 30 or above and within 360 days    GFR calc Af Amer  Date Value Ref Range Status  03/21/2020 101 >59 mL/min/1.73 Final    Comment:    **[facility] currently reports eGFR in compliance with the current**   recommendations of the [employer]. [facility] will   update reporting as new guidelines are published from the NKF-ASN   Task force.    GFR calc non Af Amer  Date Value Ref Range Status  03/21/2020 88 >59 mL/min/1.73 Final   eGFR  Date Value Ref Range Status  05/29/2021 92 >59 mL/min/1.73 Final         Passed - Patient is not pregnant      Passed - Valid encounter within last 12 months    Recent Outpatient Visits          1 month ago Localized edema   [place], DO   4 months ago Encounter for [employer] annual wellness exam   [facility] [place], Dionne Bucy, MD   6 months ago [place],  Dionne Bucy, MD   10 months ago Hypercholesterolemia   [facility], Clearnce Sorrel, [place]   1 year ago Annual physical exam   [facility], Clearnce Sorrel, [place]      Future Appointments            In 8 months Thedore Mins, Ria Comment, PA-C [employer], [place]

## 2021-07-31 ENCOUNTER — Encounter: Payer: Self-pay | Admitting: Gastroenterology

## 2021-07-31 ENCOUNTER — Other Ambulatory Visit: Payer: Self-pay

## 2021-07-31 ENCOUNTER — Ambulatory Visit: Payer: Medicare Other | Admitting: Gastroenterology

## 2021-07-31 VITALS — BP 112/68 | HR 77 | Temp 98.2°F | Ht 62.0 in | Wt 205.6 lb

## 2021-07-31 DIAGNOSIS — D509 Iron deficiency anemia, unspecified: Secondary | ICD-10-CM

## 2021-07-31 MED ORDER — NA SULFATE-K SULFATE-MG SULF 17.5-3.13-1.6 GM/177ML PO SOLN: 354.0000 mL | Freq: Once | ORAL | 0 refills | Status: AC

## 2021-07-31 NOTE — Patient Instructions (Signed)
Please use a fleet enema before starting your prep for your colonoscopy.

## 2021-07-31 NOTE — Progress Notes (Signed)
Jonathon Bellows MD, MRCP(U.K) [address]  [place]  [place], [place] [address]  Main: [phone]  Fax: [phone]   Gastroenterology Consultation  Referring Provider:     Virginia Crews, MD Primary Care Physician:  [name], PA-C Primary Gastroenterologist:  Dr. Jonathon Bellows  Reason for Consultation:  Anemia         HPI: -  Cathy Townsend is a 66 y.o. y/o female referred for anemia. Last colonoscopy in 2018-poor prep - repeat colonoscopy was recommended in 6 months but she didn't get it done.    She denies any overt blood loss.  Denies any use of NSAIDs.  Denies any rectal bleeding, change in bowel habits, vaginal bleeding, blood in the urine.  Denies any unintentional weight loss.  Not on any iron tablets.  CBC Latest Ref Rng & Units 05/29/2021 03/21/2020 12/07/2018  WBC 3.4 - 10.8 x10E3/uL 4.0 4.5 8.7  Hemoglobin 11.1 - 15.9 g/dL 9.8(L) 12.2 11.8(L)  Hematocrit 34.0 - 46.6 % 30.9(L) 37.9 35.6(L)  Platelets 150 - 450 x10E3/uL 360 398 373   MCV 91   Iron/TIBC/Ferritin/ %Sat    Component Value Date/Time   IRON 47 05/29/2021 1427   TIBC 356 05/29/2021 1427   FERRITIN 27 05/29/2021 1427   IRONPCTSAT 13 (L) 05/29/2021 1427      Past Medical History:  Diagnosis Date   Allergy    Arthritis    "think I have it in both knees" (05/06/2012)   Hyperlipidemia    Hypertension    PONV (postoperative nausea and vomiting)     Past Surgical History:  Procedure Laterality Date   COLONOSCOPY WITH PROPOFOL N/A 07/13/2016   Procedure: COLONOSCOPY WITH PROPOFOL;  Surgeon: Jonathon Bellows, MD;  Location: [facility] ENDOSCOPY;  Service: Endoscopy;  Laterality: N/A;   DILATION AND CURETTAGE OF UTERUS     JOINT REPLACEMENT     right knee   REPLACEMENT TOTAL KNEE  ~ 2008   right   TOTAL KNEE ARTHROPLASTY Left 02/10/2015   Procedure: TOTAL KNEE ARTHROPLASTY;  Surgeon: Hessie Knows, MD;  Location: [facility] ORS;  Service: Orthopedics;  Laterality: Left;   TOTAL KNEE REVISION   05/05/2012   Procedure: TOTAL KNEE REVISION;  Surgeon: Kerin Salen, MD;  Location: [place];  Service: Orthopedics;  Laterality: Right;   TOTAL THYROIDECTOMY  1990's   TUBAL LIGATION      Prior to Admission medications   Medication Sig Start Date End Date Taking? Authorizing Provider  aspirin EC 81 MG tablet Take 81 mg by mouth.    [provider]  atorvastatin (LIPITOR) 20 MG tablet Take 1 tablet (20 mg total) by mouth daily. 03/28/21   Virginia Crews, MD  ferrous sulfate 325 (65 FE) MG tablet Take 1 tablet (325 mg total) by mouth daily with breakfast. 03/28/21   Bacigalupo, Dionne Bucy, MD  fluticasone (FLONASE) 50 MCG/ACT nasal spray Place 2 sprays into both nostrils daily. 03/28/21   Bacigalupo, Dionne Bucy, MD  furosemide (LASIX) 20 MG tablet Take 1 tablet (20 mg total) by mouth daily as needed for edema. 05/29/21   Myles Gip, DO  hydrOXYzine (ATARAX/VISTARIL) 25 MG tablet Take 1 tablet (25 mg total) by mouth at bedtime as needed. 03/28/21   Virginia Crews, MD  ibuprofen (ADVIL) 800 MG tablet Take 1 tablet (800 mg total) by mouth every evening. 03/28/21   Bacigalupo, Dionne Bucy, MD  montelukast (SINGULAIR) 10 MG tablet Take 1 tablet (10 mg total) by  mouth daily. 03/28/21   Virginia Crews, MD  triamcinolone cream (KENALOG) 0.1 % APPLY TO AFFECTED AREA TWICE A DAY 03/28/21   Bacigalupo, Dionne Bucy, MD    Family History  Problem Relation Age of Onset   CAD Mother    Heart disease Mother    Heart attack Brother      Social History   Tobacco Use   Smoking status: Every Day    Packs/day: 0.50    Years: 40.00    Pack years: 20.00    Types: Cigarettes   Smokeless tobacco: Never  Vaping Use   Vaping Use: Never used  Substance Use Topics   Alcohol use: Yes    Alcohol/week: 7.0 standard drinks    Types: 7 Cans of beer per week    Comment: 1/day   Drug use: No    Allergies as of 07/31/2021 - Review Complete 07/31/2021  Allergen Reaction Noted    Oxycodone-acetaminophen Itching 01/24/2015   Tramadol Itching 04/04/2015   Meloxicam Rash 01/24/2015    Review of Systems:    All systems reviewed and negative except where noted in HPI.   Physical Exam:  BP 112/68    Pulse 77    Temp 98.2 F (36.8 C) (Oral)    Ht 5\' 2"  (1.575 m)    Wt 205 lb 9.6 oz (93.3 kg)    BMI 37.60 kg/m  No LMP recorded. Patient is postmenopausal. Psych:  Alert and cooperative. Normal mood and affect. General:   Alert,  Well-developed, well-nourished, pleasant and cooperative in NAD Head:  Normocephalic and atraumatic. Eyes:  Sclera clear, no icterus.   Conjunctiva pink. Ears:  Normal auditory acuity. Lungs:  Respirations even and unlabored.  Clear throughout to auscultation.   No wheezes, crackles, or rhonchi. No acute distress. Heart:  Regular rate and rhythm; no murmurs, clicks, rubs, or gallops. Abdomen:  Normal bowel sounds.  No bruits.  Soft, non-tender and non-distended without masses, hepatosplenomegaly or hernias noted.  No guarding or rebound tenderness.    Neurologic:  Alert and oriented x3;  grossly normal neurologically. Psych:  Alert and cooperative. Normal mood and affect.  Imaging Studies: No results found.  Assessment and Plan:   NARMEEN KERPER is a 66 y.o. y/o female has been referred for anemia- likely component of iron deficiency anemia .   Plan  Celiac serology , urine analysis, b12,folate, h pylori breath test  EGD+colonoscopy and if negative capsule study of the small bowel IF Hb doesn't improve with iron replacement then refer to hematology to consider bone marrow bx.   I have discussed alternative options, risks & benefits,  which include, but are not limited to, bleeding, infection, perforation,respiratory complication & drug reaction.  The patient agrees with this plan & written consent will be obtained.     Follow up in 6 weeks  Dr Jonathon Bellows MD,MRCP(U.K)

## 2021-08-01 ENCOUNTER — Other Ambulatory Visit: Payer: Self-pay | Admitting: Family Medicine

## 2021-08-01 DIAGNOSIS — R21 Rash and other nonspecific skin eruption: Secondary | ICD-10-CM

## 2021-08-01 DIAGNOSIS — D509 Iron deficiency anemia, unspecified: Secondary | ICD-10-CM | POA: Diagnosis not present

## 2021-08-01 DIAGNOSIS — L299 Pruritus, unspecified: Secondary | ICD-10-CM

## 2021-08-01 LAB — URINALYSIS
Bilirubin, UA: NEGATIVE
Glucose, UA: NEGATIVE
Ketones, UA: NEGATIVE
Leukocytes,UA: NEGATIVE
Nitrite, UA: NEGATIVE
RBC, UA: NEGATIVE
Specific Gravity, UA: 1.019 (ref 1.005–1.030)
Urobilinogen, Ur: 0.2 mg/dL (ref 0.2–1.0)
pH, UA: 6.5 (ref 5.0–7.5)

## 2021-08-01 LAB — B12 AND FOLATE PANEL
Folate: >20 ng/mL (ref >3.0)
Vitamin B-12: 737 pg/mL (ref 232–1245)

## 2021-08-01 LAB — CELIAC DISEASE AB SCREEN W/RFX
Antigliadin Abs, IgA: 2 units (ref 0–19)
IgA/Immunoglobulin A, Serum: 192 mg/dL (ref 87–352)
Transglutaminase IgA: <2 U/mL (ref 0–3)

## 2021-08-02 LAB — H. PYLORI BREATH TEST: H pylori Breath Test: POSITIVE — AB

## 2021-08-02 MED ORDER — PEG 3350-KCL-NA BICARB-NACL 420 G PO SOLR: ORAL | 0 refills | Status: DC

## 2021-08-02 NOTE — Telephone Encounter (Signed)
Requested medications are due for refill today.  unsure  Requested medications are on the active medications list.  no  Last refill. 03/28/2021  Future visit scheduled.   yes  Notes to clinic.  Medication was discontinued 07/31/2021.    Requested Prescriptions  Pending Prescriptions Disp Refills   hydrOXYzine (ATARAX) 25 MG tablet [Pharmacy Med Name: HYDROXYZINE HCL 25 MG TABLET] 90 tablet 1    Sig: TAKE 1 TABLET BY MOUTH AT BEDTIME AS NEEDED.     Ear, Nose, and Throat:  Antihistamines 2 Passed - 08/01/2021  5:13 PM      Passed - Cr in normal range and within 360 days    Creatinine, Ser  Date Value Ref Range Status  05/29/2021 0.72 0.57 - 1.00 mg/dL Final          Passed - Valid encounter within last 12 months    Recent Outpatient Visits           2 months ago Localized edema   [place], DO   4 months ago Encounter for [employer] annual wellness exam   [facility] [place], Dionne Bucy, MD   7 months ago [place] Bacigalupo, Dionne Bucy, MD   11 months ago Hypercholesterolemia   [facility], Clearnce Sorrel, [place]   1 year ago Annual physical exam   [place], Clearnce Sorrel, [place]       Future Appointments             In 2 months Jonathon Bellows, MD [place]   In 8 months Thedore Mins, Ria Comment, PA-C [employer], [place]

## 2021-08-02 NOTE — Addendum Note (Signed)
Addended by: Wayna Chalet on: 08/02/2021 10:42 AM   Modules accepted: Orders

## 2021-08-11 ENCOUNTER — Other Ambulatory Visit: Payer: Self-pay

## 2021-08-11 MED ORDER — OMEPRAZOLE 20 MG PO CPDR: 20.0000 mg | DELAYED_RELEASE_CAPSULE | Freq: Every day | ORAL | 0 refills | Status: DC

## 2021-08-11 MED ORDER — AMOXICILLIN 500 MG PO CAPS: 500.0000 mg | ORAL_CAPSULE | Freq: Two times a day (BID) | ORAL | 0 refills | Status: AC

## 2021-08-11 MED ORDER — CLARITHROMYCIN 500 MG PO TABS: 500.0000 mg | ORAL_TABLET | Freq: Two times a day (BID) | ORAL | 0 refills | Status: AC

## 2021-08-21 ENCOUNTER — Ambulatory Visit: Payer: Medicare Other | Admitting: Certified Registered"

## 2021-08-21 ENCOUNTER — Encounter
Admission: RE | Disposition: A | Discharge status: Home / Self Care | Payer: Self-pay | Source: Home / Self Care | Attending: Gastroenterology

## 2021-08-21 ENCOUNTER — Ambulatory Visit
Admission: RE | Admit: 2021-08-21 | Discharge: 2021-08-21 | Disposition: A | Discharge status: Home / Self Care | Payer: Medicare Other | Attending: Gastroenterology | Admitting: Gastroenterology

## 2021-08-21 DIAGNOSIS — I10 Essential (primary) hypertension: Secondary | ICD-10-CM | POA: Diagnosis not present

## 2021-08-21 DIAGNOSIS — K2970 Gastritis, unspecified, without bleeding: Secondary | ICD-10-CM | POA: Diagnosis not present

## 2021-08-21 DIAGNOSIS — K3189 Other diseases of stomach and duodenum: Secondary | ICD-10-CM | POA: Insufficient documentation

## 2021-08-21 DIAGNOSIS — F17210 Nicotine dependence, cigarettes, uncomplicated: Secondary | ICD-10-CM | POA: Insufficient documentation

## 2021-08-21 DIAGNOSIS — B9681 Helicobacter pylori [H. pylori] as the cause of diseases classified elsewhere: Secondary | ICD-10-CM | POA: Diagnosis not present

## 2021-08-21 DIAGNOSIS — Z96653 Presence of artificial knee joint, bilateral: Secondary | ICD-10-CM | POA: Diagnosis not present

## 2021-08-21 DIAGNOSIS — E785 Hyperlipidemia, unspecified: Secondary | ICD-10-CM | POA: Insufficient documentation

## 2021-08-21 DIAGNOSIS — D509 Iron deficiency anemia, unspecified: Secondary | ICD-10-CM | POA: Insufficient documentation

## 2021-08-21 HISTORY — PX: COLONOSCOPY WITH PROPOFOL: SHX5780

## 2021-08-21 HISTORY — PX: ESOPHAGOGASTRODUODENOSCOPY: SHX5428

## 2021-08-21 SURGERY — COLONOSCOPY WITH PROPOFOL
Anesthesia: General

## 2021-08-21 MED ORDER — PROPOFOL 500 MG/50ML IV EMUL
INTRAVENOUS | Status: AC
  Filled 2021-08-21: qty 50

## 2021-08-21 MED ORDER — MIDAZOLAM HCL 2 MG/2ML IJ SOLN
INTRAMUSCULAR | Status: AC
  Filled 2021-08-21: qty 2

## 2021-08-21 MED ORDER — SODIUM CHLORIDE 0.9 % IV SOLN: INTRAVENOUS | Status: DC

## 2021-08-21 MED ORDER — LIDOCAINE 2% (20 MG/ML) 5 ML SYRINGE
INTRAMUSCULAR | Status: DC | PRN
Start: 2021-08-21 — End: 2021-08-21
  Administered 2021-08-21: 20 mg via INTRAVENOUS

## 2021-08-21 MED ORDER — GLYCOPYRROLATE 0.2 MG/ML IJ SOLN
INTRAMUSCULAR | Status: AC
  Filled 2021-08-21: qty 1

## 2021-08-21 MED ORDER — PROPOFOL 500 MG/50ML IV EMUL
INTRAVENOUS | Status: DC | PRN
  Administered 2021-08-21: 120 ug/kg/min via INTRAVENOUS

## 2021-08-21 MED ORDER — PROPOFOL 10 MG/ML IV BOLUS
INTRAVENOUS | Status: DC | PRN
  Administered 2021-08-21: 70 mg via INTRAVENOUS

## 2021-08-21 MED ORDER — GLYCOPYRROLATE 0.2 MG/ML IJ SOLN
INTRAMUSCULAR | Status: DC | PRN
  Administered 2021-08-21: .2 mg via INTRAVENOUS

## 2021-08-21 MED ORDER — MIDAZOLAM HCL 5 MG/5ML IJ SOLN
INTRAMUSCULAR | Status: DC | PRN
Start: 2021-08-21 — End: 2021-08-21
  Administered 2021-08-21: 2 mg via INTRAVENOUS

## 2021-08-21 NOTE — Anesthesia Preprocedure Evaluation (Signed)
Anesthesia Evaluation  ?Patient identified by MRN, date of birth, ID band ?Patient awake ? ? ? ?Reviewed: ?Allergy & Precautions, H&P , NPO status , Patient's Chart, lab work & pertinent test results, reviewed documented beta blocker date and time  ? ?History of Anesthesia Complications ?(+) PONV and history of anesthetic complications ? ?Airway ?Mallampati: II ? ? ?Neck ROM: full ? ? ? Dental ? ?(+) Poor Dentition ?  ?Pulmonary ?neg pulmonary ROS, Current Smoker,  ?  ?Pulmonary exam normal ? ? ? ? ? ? ? Cardiovascular ?Exercise Tolerance: Poor ?hypertension, On Medications ?Normal cardiovascular exam+ Valvular Problems/Murmurs  ?Rhythm:regular Rate:Normal ? ? ?  ?Neuro/Psych ?negative neurological ROS ? negative psych ROS  ? GI/Hepatic ?negative GI ROS, Neg liver ROS,   ?Endo/Other  ?negative endocrine ROS ? Renal/GU ?negative Renal ROS  ?negative genitourinary ?  ?Musculoskeletal ? ? Abdominal ?  ?Peds ? Hematology ?negative hematology ROS ?(+)   ?Anesthesia Other Findings ?Past Medical History: ?No date: Allergy ?No date: Arthritis ?    Comment:  "think I have it in both knees" (05/06/2012) ?No date: Hyperlipidemia ?No date: Hypertension ?No date: PONV (postoperative nausea and vomiting) ?Past Surgical History: ?07/13/2016: COLONOSCOPY WITH PROPOFOL; N/A ?    Comment:  Procedure: COLONOSCOPY WITH PROPOFOL;  Surgeon: Bailey Mech  ?             Vicente Males, MD;  Location: [facility] ENDOSCOPY;  Service: Endoscopy; ?             Laterality: N/A; ?No date: DILATION AND CURETTAGE OF UTERUS ?No date: JOINT REPLACEMENT ?    Comment:  right knee ?~ 2008: REPLACEMENT TOTAL KNEE ?    Comment:  right ?02/10/2015: TOTAL KNEE ARTHROPLASTY; Left ?    Comment:  Procedure: TOTAL KNEE ARTHROPLASTY;  Surgeon: Legrand Como  ?             Rudene Christians, MD;  Location: [facility] ORS;  Service: Orthopedics;   ?             Laterality: Left; ?05/05/2012: TOTAL KNEE REVISION ?    Comment:  Procedure: TOTAL KNEE REVISION;  Surgeon: Kerin Salen, ?             MD;  Location: [place];  Service: Orthopedics;  Laterality: ?             Right; ?1990's: TOTAL THYROIDECTOMY ?No date: TUBAL LIGATION ?BMI   ? Body Mass Index: 37.31 kg/m?  ?  ? Reproductive/Obstetrics ?negative OB ROS ? ?  ? ? ? ? ? ? ? ? ? ? ? ? ? ?  ?  ? ? ? ? ? ? ? ? ?Anesthesia Physical ?Anesthesia Plan ? ?ASA: 3 ? ?Anesthesia Plan: General  ? ?Post-op Pain Management:   ? ?Induction:  ? ?PONV Risk Score and Plan:  ? ?Airway Management Planned:  ? ?Additional Equipment:  ? ?Intra-op Plan:  ? ?Post-operative Plan:  ? ?Informed Consent: I have reviewed the patients History and Physical, chart, labs and discussed the procedure including the risks, benefits and alternatives for the proposed anesthesia with the patient or authorized representative who has indicated his/her understanding and acceptance.  ? ? ? ?Dental Advisory Given ? ?Plan Discussed with: CRNA ? ?Anesthesia Plan Comments:   ? ? ? ? ? ? ?Anesthesia Quick Evaluation ? ?

## 2021-08-21 NOTE — Op Note (Signed)
[facility] ?Gastroenterology ?Patient Name: Cathy Townsend ?Procedure Date: 08/21/2021 9:43 AM ?MRN: [id number] ?Account #: [id number] ?Date of Birth: Mar 09, 1956 ?Admit Type: Outpatient ?Age: 66 ?Room: [facility] ENDO ROOM 4 ?Gender: Female ?Note Status: Finalized ?Instrument Name: Colonscope [id number] ?Procedure:             Colonoscopy ?Indications:           Iron deficiency anemia ?Providers:             Jonathon Bellows MD, MD ?Referring MD:          Forest Gleason Md, MD (Referring MD) ?Medicines:             Monitored Anesthesia Care ?Complications:         No immediate complications. ?Procedure:             Pre-Anesthesia Assessment: ?                       - Prior to the procedure, a History and Physical was  ?                       performed, and patient medications, allergies and  ?                       sensitivities were reviewed. The patient's tolerance  ?                       of previous anesthesia was reviewed. ?                       - The risks and benefits of the procedure and the  ?                       sedation options and risks were discussed with the  ?                       patient. All questions were answered and informed  ?                       consent was obtained. ?                       - ASA Grade Assessment: II - A patient with mild  ?                       systemic disease. ?                       After obtaining informed consent, the colonoscope was  ?                       passed under direct vision. Throughout the procedure,  ?                       the patient's blood pressure, pulse, and oxygen  ?                       saturations were monitored continuously. The  ?                       Colonoscope was introduced through  the anus and  ?                       advanced to the the cecum, identified by the  ?                       appendiceal orifice. The colonoscopy was performed  ?                       without difficulty. The patient tolerated the  ?                       procedure  well. The quality of the bowel preparation  ?                       was poor. ?Findings: ?     The perianal and digital rectal examinations were normal. ?     A large amount of semi-liquid stool was found at the hepatic flexure, in  ?     the ascending colon and in the cecum, interfering with visualization. ?     The exam was otherwise without abnormality. ?Impression:            - Preparation of the colon was poor. ?                       - Stool at the hepatic flexure, in the ascending colon  ?                       and in the cecum. ?                       - The examination was otherwise normal. ?                       - No specimens collected. ?Recommendation:        - Discharge patient to home (with escort). ?                       - Resume previous diet. ?                       - Continue present medications. ?                       - Repeat colonoscopy in 2 weeks because the bowel  ?                       preparation was suboptimal. ?Procedure Code(s):     --- Professional --- ?                       [phone], Colonoscopy, flexible; diagnostic, including  ?                       collection of specimen(s) by brushing or washing, when  ?                       performed (separate procedure) ?Diagnosis Code(s):     --- Professional --- ?  D50.9, Iron deficiency anemia, unspecified ?CPT copyright 2019 American Medical Association. All rights reserved. ?The codes documented in this report are preliminary and upon coder review may  ?be revised to meet current compliance requirements. ?Jonathon Bellows, MD ?Jonathon Bellows MD, MD ?08/21/2021 10:25:06 AM ?This report has been signed electronically. ?Number of Addenda: 0 ?Note Initiated On: 08/21/2021 9:43 AM ?Scope Withdrawal Time: 0 hours 3 minutes 14 seconds  ?Total Procedure Duration: 0 hours 6 minutes 38 seconds  ?Estimated Blood Loss:  Estimated blood loss: none. ?     [facility] ?

## 2021-08-21 NOTE — Op Note (Signed)
[facility] ?Gastroenterology ?Patient Name: Cathy Townsend ?Procedure Date: 08/21/2021 9:44 AM ?MRN: [id number] ?Account #: [id number] ?Date of Birth: 1955-07-07 ?Admit Type: Outpatient ?Age: 66 ?Room: [facility] ENDO ROOM 4 ?Gender: Female ?Note Status: Finalized ?Instrument Name: Upper-Endoscope [id number] ?Procedure:             Upper GI endoscopy ?Indications:           Iron deficiency anemia ?Providers:             Jonathon Bellows MD, MD ?Referring MD:          Forest Gleason Md, MD (Referring MD) ?Medicines:             Monitored Anesthesia Care ?Complications:         No immediate complications. ?Procedure:             Pre-Anesthesia Assessment: ?                       - Prior to the procedure, a History and Physical was  ?                       performed, and patient medications, allergies and  ?                       sensitivities were reviewed. The patient's tolerance  ?                       of previous anesthesia was reviewed. ?                       - The risks and benefits of the procedure and the  ?                       sedation options and risks were discussed with the  ?                       patient. All questions were answered and informed  ?                       consent was obtained. ?                       - ASA Grade Assessment: II - A patient with mild  ?                       systemic disease. ?                       After obtaining informed consent, the endoscope was  ?                       passed under direct vision. Throughout the procedure,  ?                       the patient's blood pressure, pulse, and oxygen  ?                       saturations were monitored continuously. The Endoscope  ?                       was  introduced through the mouth, and advanced to the  ?                       third part of duodenum. The upper GI endoscopy was  ?                       accomplished with ease. The patient tolerated the  ?                       procedure well. ?Findings: ?     The examined  duodenum was normal. ?     The esophagus was normal. ?     Localized mild mucosal changes characterized by tight circumferential  ?     folds were found in the prepyloric region of the stomach. Biopsies were  ?     taken with a cold forceps for histology. ?     The cardia and gastric fundus were normal on retroflexion. ?Impression:            - Normal examined duodenum. ?                       - Normal esophagus. ?                       - Tight circumferentially folded mucosa in the  ?                       prepyloric region of the stomach. Biopsied. ?Recommendation:        - Await pathology results. ?                       - Perform a colonoscopy today. ?Procedure Code(s):     --- Professional --- ?                       [phone], Esophagogastroduodenoscopy, flexible,  ?                       transoral; with biopsy, single or multiple ?Diagnosis Code(s):     --- Professional --- ?                       K31.89, Other diseases of stomach and duodenum ?                       D50.9, Iron deficiency anemia, unspecified ?CPT copyright 2019 American Medical Association. All rights reserved. ?The codes documented in this report are preliminary and upon coder review may  ?be revised to meet current compliance requirements. ?Jonathon Bellows, MD ?Jonathon Bellows MD, MD ?08/21/2021 10:13:45 AM ?This report has been signed electronically. ?Number of Addenda: 0 ?Note Initiated On: 08/21/2021 9:44 AM ?Estimated Blood Loss:  Estimated blood loss: none. ?     [facility] ?

## 2021-08-21 NOTE — Transfer of Care (Signed)
Immediate Anesthesia Transfer of Care Note ? ?[name] ? ?Procedure(s) Performed: COLONOSCOPY WITH PROPOFOL ?ESOPHAGOGASTRODUODENOSCOPY (EGD) ? ?Patient Location: Endoscopy Unit ? ?Anesthesia Type:General ? ?Level of Consciousness: awake ? ?Airway & Oxygen Therapy: Patient Spontanous Breathing ? ?Post-op Assessment: Report given to RN and Post -op Vital signs reviewed and stable ? ?Post vital signs: Reviewed ? ?Last Vitals:  ?Vitals Value Taken Time  ?BP    ?Temp    ?Pulse 76 08/21/21 1026  ?Resp 20 08/21/21 1026  ?SpO2 100 % 08/21/21 1026  ?Vitals shown include unvalidated device data. ? ?Last Pain:  ?Vitals:  ? 08/21/21 1025  ?TempSrc:   ?PainSc: 0-No pain  ?   ? ?  ? ?Complications: No notable events documented. ?

## 2021-08-21 NOTE — H&P (Signed)
? ? ? ?Jonathon Bellows, MD ?[address], [place], [place], [place], [address] ?[address] [place], [place], [place], [address] ?Phone: [phone]  ?Fax: [phone] ? ?Primary Care Physician:  [name], PA-C ? ? ?Pre-Procedure History & Physical: ?HPI:  Cathy Townsend is a 66 y.o. female is here for an endoscopy and colonoscopy  ?  ?Past Medical History:  ?Diagnosis Date  ? Allergy   ? Arthritis   ? "think I have it in both knees" (05/06/2012)  ? Hyperlipidemia   ? Hypertension   ? PONV (postoperative nausea and vomiting)   ? ? ?Past Surgical History:  ?Procedure Laterality Date  ? COLONOSCOPY WITH PROPOFOL N/A 07/13/2016  ? Procedure: COLONOSCOPY WITH PROPOFOL;  Surgeon: Jonathon Bellows, MD;  Location: [facility] ENDOSCOPY;  Service: Endoscopy;  Laterality: N/A;  ? DILATION AND CURETTAGE OF UTERUS    ? JOINT REPLACEMENT    ? right knee  ? REPLACEMENT TOTAL KNEE  ~ 2008  ? right  ? TOTAL KNEE ARTHROPLASTY Left 02/10/2015  ? Procedure: TOTAL KNEE ARTHROPLASTY;  Surgeon: Hessie Knows, MD;  Location: [facility] ORS;  Service: Orthopedics;  Laterality: Left;  ? TOTAL KNEE REVISION  05/05/2012  ? Procedure: TOTAL KNEE REVISION;  Surgeon: Kerin Salen, MD;  Location: [facility];  Service: Orthopedics;  Laterality: Right;  ? TOTAL THYROIDECTOMY  1990's  ? TUBAL LIGATION    ? ? ?Prior to Admission medications   ?Medication Sig Start Date End Date Taking? Authorizing Provider  ?amoxicillin (AMOXIL) 500 MG capsule Take 1 capsule (500 mg total) by mouth 2 (two) times daily for 14 days. 08/11/21 08/25/21  Jonathon Bellows, MD  ?aspirin EC 81 MG tablet Take 81 mg by mouth.    [provider]  ?atorvastatin (LIPITOR) 20 MG tablet Take 1 tablet (20 mg total) by mouth daily. 03/28/21   Virginia Crews, MD  ?clarithromycin (BIAXIN) 500 MG tablet Take 1 tablet (500 mg total) by mouth 2 (two) times daily for 14 days. 08/11/21 08/25/21  Jonathon Bellows, MD  ?ferrous sulfate 325 (65 FE) MG tablet Take 1 tablet (325 mg total) by mouth daily with  breakfast. 03/28/21   Bacigalupo, Dionne Bucy, MD  ?fluticasone (FLONASE) 50 MCG/ACT nasal spray Place 2 sprays into both nostrils daily. 03/28/21   Bacigalupo, Dionne Bucy, MD  ?furosemide (LASIX) 20 MG tablet Take 1 tablet (20 mg total) by mouth daily as needed for edema. 05/29/21   Myles Gip, DO  ?hydrOXYzine (ATARAX) 25 MG tablet TAKE 1 TABLET BY MOUTH AT BEDTIME AS NEEDED. 08/03/21   [name], PA-C  ?ibuprofen (ADVIL) 800 MG tablet Take 1 tablet (800 mg total) by mouth every evening. 03/28/21   Virginia Crews, MD  ?montelukast (SINGULAIR) 10 MG tablet Take 1 tablet (10 mg total) by mouth daily. 03/28/21   Bacigalupo, Dionne Bucy, MD  ?omeprazole (PRILOSEC) 20 MG capsule Take 1 capsule (20 mg total) by mouth daily for 14 days. 08/11/21 08/25/21  Jonathon Bellows, MD  ?polyethylene glycol-electrolytes (NULYTELY) 420 g solution Prepare according to package instructions. Starting at 5:00 PM: Drink one 8 oz glass of mixture every 15 minutes until you finish half of the jug. Five hours prior to procedure, drink 8 oz glass of mixture every 15 minutes until it is all gone. Make sure you do not drink anything 4 hours prior to your procedure. 08/02/21   Jonathon Bellows, MD  ?triamcinolone cream (KENALOG) 0.1 % APPLY TO AFFECTED AREA TWICE A DAY 03/28/21  Virginia Crews, MD  ? ? ?Allergies as of 07/31/2021 - Review Complete 07/31/2021  ?Allergen Reaction Noted  ? Oxycodone-acetaminophen Itching 01/24/2015  ? Tramadol Itching 04/04/2015  ? Meloxicam Rash 01/24/2015  ? ? ?Family History  ?Problem Relation Age of Onset  ? CAD Mother   ? Heart disease Mother   ? Heart attack Brother   ? ? ?Social History  ? ?Socioeconomic History  ? Marital status: Married  ?  Spouse name: Not on file  ? Number of children: 2  ? Years of education: Not on file  ? Highest education level: High school graduate  ?Occupational History  ? Occupation: retired  ?Tobacco Use  ? Smoking status: Every Day  ?  Packs/day: 0.50  ?  Years: 40.00  ?   Pack years: 20.00  ?  Types: Cigarettes  ? Smokeless tobacco: Never  ?Vaping Use  ? Vaping Use: Never used  ?Substance and Sexual Activity  ? Alcohol use: Yes  ?  Alcohol/week: 7.0 standard drinks  ?  Types: 7 Cans of beer per week  ?  Comment: 1/day  ? Drug use: No  ? Sexual activity: Never  ?Other Topics Concern  ? Not on file  ?Social History Narrative  ? Not on file  ? ?Social Determinants of Health  ? ?Financial Resource Strain: Not on file  ?Food Insecurity: Not on file  ?Transportation Needs: Not on file  ?Physical Activity: Not on file  ?Stress: Not on file  ?Social Connections: Not on file  ?Intimate Partner Violence: Not on file  ? ? ?Review of Systems: ?See HPI, otherwise negative ROS ? ?Physical Exam: ?BP (!) 124/58   Pulse (!) 56   Temp (!) 96.5 ?F (35.8 ?C) (Temporal)   Resp 20   Ht '5\' 2"'$  (1.575 m)   Wt 92.5 kg   SpO2 99%   BMI 37.31 kg/m?  ?General:   Alert,  pleasant and cooperative in NAD ?Head:  Normocephalic and atraumatic. ?Neck:  Supple; no masses or thyromegaly. ?Lungs:  Clear throughout to auscultation, normal respiratory effort.    ?Heart:  +S1, +S2, Regular rate and rhythm, No edema. ?Abdomen:  Soft, nontender and nondistended. Normal bowel sounds, without guarding, and without rebound.   ?Neurologic:  Alert and  oriented x4;  grossly normal neurologically. ? ?Impression/Plan: ?Cathy Townsend is here for an endoscopy and colonoscopy  to be performed for  evaluation of iron deficiency anemia ?   ?Risks, benefits, limitations, and alternatives regarding endoscopy have been reviewed with the patient.  Questions have been answered.  All parties agreeable. ? ? ?Jonathon Bellows, MD  08/21/2021, 9:56 AM ? ?

## 2021-08-22 ENCOUNTER — Encounter: Payer: Self-pay | Admitting: Gastroenterology

## 2021-08-22 LAB — SURGICAL PATHOLOGY

## 2021-08-22 NOTE — Anesthesia Postprocedure Evaluation (Signed)
Anesthesia Post Note ? ?Patient: MAEBRY OBRIEN ? ?Procedure(s) Performed: COLONOSCOPY WITH PROPOFOL ?ESOPHAGOGASTRODUODENOSCOPY (EGD) ? ?Patient location during evaluation: PACU ?Anesthesia Type: General ?Level of consciousness: awake and alert ?Pain management: pain level controlled ?Vital Signs Assessment: post-procedure vital signs reviewed and stable ?Respiratory status: spontaneous breathing, nonlabored ventilation, respiratory function stable and patient connected to nasal cannula oxygen ?Cardiovascular status: blood pressure returned to baseline and stable ?Postop Assessment: no apparent nausea or vomiting ?Anesthetic complications: no ? ? ?No notable events documented. ? ? ?Last Vitals:  ?Vitals:  ? 08/21/21 1040 08/21/21 1101  ?BP: 111/81 130/70  ?Pulse:    ?Resp:    ?Temp:    ?SpO2: 99%   ?  ?Last Pain:  ?Vitals:  ? 08/22/21 0727  ?TempSrc:   ?PainSc: 0-No pain  ? ? ?  ?  ?  ?  ?  ?  ? ?Molli Barrows ? ? ? ? ?

## 2021-08-25 NOTE — Progress Notes (Signed)
Poor bowel prep- repeat colonoscopy in 2 weeks, stomach showed H pylori gastritis- breath test was also positive at office- check if antibiotics has been sent

## 2021-08-26 ENCOUNTER — Other Ambulatory Visit: Payer: Self-pay | Admitting: Family Medicine

## 2021-08-28 ENCOUNTER — Telehealth: Payer: Self-pay

## 2021-08-28 NOTE — Telephone Encounter (Signed)
-----   Message from Jonathon Bellows, MD sent at 08/25/2021 11:14 AM EST ----- ?Poor bowel prep- repeat colonoscopy in 2 weeks, stomach showed H pylori gastritis- breath test was also positive at office- check if antibiotics has been sent  ?

## 2021-08-28 NOTE — Telephone Encounter (Signed)
Called patient to let her know that Dr. Vicente Males stated that she needed to repeat her colonoscopy in two weeks due to poor prepping. Patient got upset and stated that she will not do a colonoscopy again because the prep "is nasty"! I told her that we could send her another prescription (pills) for her to take and she declined it as well. I then told her that Dr. Vicente Males had seen H Pylori gastritis in her stomach and he wanted to know if she was still taking them. Patient stated that she was still taking the antibiotics and that she was almost done. ?

## 2021-08-28 NOTE — Telephone Encounter (Signed)
Requested Prescriptions  ?Pending Prescriptions Disp Refills  ?? furosemide (LASIX) 20 MG tablet [Pharmacy Med Name: FUROSEMIDE 20 MG TABLET] 90 tablet 0  ?  Sig: TAKE 1 TABLET (20 MG TOTAL) BY MOUTH DAILY AS NEEDED FOR EDEMA.  ?  ? Cardiovascular:  Diuretics - Loop Failed - 08/26/2021  2:03 PM  ?  ?  Failed - Na in normal range and within 180 days  ?  Sodium  ?Date Value Ref Range Status  ?05/29/2021 146 (H) 134 - 144 mmol/L Final  ?   ?  ?  Failed - Cl in normal range and within 180 days  ?  Chloride  ?Date Value Ref Range Status  ?05/29/2021 108 (H) 96 - 106 mmol/L Final  ?   ?  ?  Failed - Mg Level in normal range and within 180 days  ?  No results found for: MG   ?  ?  Passed - K in normal range and within 180 days  ?  Potassium  ?Date Value Ref Range Status  ?05/29/2021 4.2 3.5 - 5.2 mmol/L Final  ?02/10/2015 3.9 mmol/L Final  ?   ?  ?  Passed - Ca in normal range and within 180 days  ?  Calcium  ?Date Value Ref Range Status  ?05/29/2021 9.7 8.7 - 10.3 mg/dL Final  ?   ?  ?  Passed - Cr in normal range and within 180 days  ?  Creatinine, Ser  ?Date Value Ref Range Status  ?05/29/2021 0.72 0.57 - 1.00 mg/dL Final  ?   ?  ?  Passed - Last BP in normal range  ?  BP Readings from Last 1 Encounters:  ?08/21/21 130/70  ?   ?  ?  Passed - Valid encounter within last 6 months  ?  Recent Outpatient Visits   ?      ? 3 months ago Localized edema  ? [place], DO  ? 5 months ago Encounter for [employer] annual wellness exam  ? [facility] [place], Dionne Bucy, MD  ? 7 months ago COVID-19  ? [facility] [place], Dionne Bucy, MD  ? 11 months ago Hypercholesterolemia  ? [facility] [place], Clearnce Sorrel, [place]  ? 1 year ago Annual physical exam  ? [facility] [place] Malling M, [place]  ?  ?  ?Future Appointments   ?        ? In 3 months Jonathon Bellows, MD [place]  ? In 7 months [place], [facility], [facility]  ?  ? ?  ?  ?  ? ? ?

## 2021-09-03 ENCOUNTER — Other Ambulatory Visit: Payer: Self-pay | Admitting: Gastroenterology

## 2021-09-05 DIAGNOSIS — L309 Dermatitis, unspecified: Secondary | ICD-10-CM | POA: Diagnosis not present

## 2021-09-05 DIAGNOSIS — I872 Venous insufficiency (chronic) (peripheral): Secondary | ICD-10-CM | POA: Diagnosis not present

## 2021-09-05 DIAGNOSIS — L2089 Other atopic dermatitis: Secondary | ICD-10-CM | POA: Diagnosis not present

## 2021-09-07 ENCOUNTER — Other Ambulatory Visit: Payer: Self-pay | Admitting: Family Medicine

## 2021-09-07 DIAGNOSIS — M1710 Unilateral primary osteoarthritis, unspecified knee: Secondary | ICD-10-CM

## 2021-10-02 ENCOUNTER — Other Ambulatory Visit: Payer: Self-pay | Admitting: Gastroenterology

## 2021-10-05 ENCOUNTER — Ambulatory Visit: Payer: Medicare Other | Admitting: Gastroenterology

## 2021-10-30 ENCOUNTER — Other Ambulatory Visit: Payer: Self-pay | Admitting: Gastroenterology

## 2021-11-07 ENCOUNTER — Ambulatory Visit: Payer: Self-pay | Admitting: *Deleted

## 2021-11-07 NOTE — Telephone Encounter (Signed)
  Chief Complaint: vaginal itching with white discharge Symptoms: above  Frequency: started last week Pertinent Negatives: Patient denies odor or urinary or bowel issues/symptoms Disposition: '[]'$ ED /'[]'$ Urgent Care (no appt availability in office) / '[x]'$ Appointment(In office/virtual)/ '[]'$  [place] [facility]/ '[]'$ Home Care/ '[]'$ Refused Recommended Disposition /'[]'$ [place] [facility]/ '[]'$  Follow-up with PCP Additional Notes: Due to her work schedule she requested an appt for next Monday morning so scheduled her with [name], PA-C for 11/14/2021 at 8:40.

## 2021-11-07 NOTE — Telephone Encounter (Signed)
I returned pt's call.   She did not give reason for call.  I have yeast infection and I'm itching.   I want the one day pill.   Reason for Disposition  [1] Symptoms of a "yeast infection" (i.e., itchy, white discharge, not bad smelling) AND [2] not improved > 3 days following CARE ADVICE  Answer Assessment - Initial Assessment Questions 1. DISCHARGE: "Describe the discharge." (e.g., white, yellow, green, gray, foamy, cottage cheese-like)     I'm having a clear discharge and itching.   I want the one day pill. 2. ODOR: "Is there a bad odor?"     No odor 3. ONSET: "When did the discharge begin?"     Started last week.  I'm itching  4. RASH: "Is there a rash in that area?" If Yes, ask: "Describe it." (e.g., redness, blisters, sores, bumps)     I'm irritated down there 5. ABDOMINAL PAIN: "Are you having any abdominal pain?" If Yes, ask: "What does it feel like? " (e.g., crampy, dull, intermittent, constant)      No pain 6. ABDOMINAL PAIN SEVERITY: If present, ask: "How bad is it?"  (e.g., mild, moderate, severe)  - MILD - doesn't interfere with normal activities   - MODERATE - interferes with normal activities or awakens from sleep   - SEVERE - patient doesn't want to move (R/O peritonitis)      No 7. CAUSE: "What do you think is causing the discharge?" "Have you had the same problem before? What happened then?"     No diarrhea or constipation.   8. OTHER SYMPTOMS: "Do you have any other symptoms?" (e.g., fever, itching, vaginal bleeding, pain with urination, injury to genital area, vaginal foreign body)     Urine is clear denies other syptoms 9. PREGNANCY: "Is there any chance you are pregnant?" "When was your last menstrual period?"     N/A  Protocols used: Vaginal Discharge-A-AH

## 2021-11-14 ENCOUNTER — Encounter: Payer: Self-pay | Admitting: Physician Assistant

## 2021-11-14 ENCOUNTER — Ambulatory Visit (INDEPENDENT_AMBULATORY_CARE_PROVIDER_SITE_OTHER): Payer: Medicare Other | Admitting: Physician Assistant

## 2021-11-14 VITALS — BP 124/60 | HR 67 | Wt 205.2 lb

## 2021-11-14 DIAGNOSIS — L299 Pruritus, unspecified: Secondary | ICD-10-CM

## 2021-11-14 DIAGNOSIS — R21 Rash and other nonspecific skin eruption: Secondary | ICD-10-CM

## 2021-11-14 DIAGNOSIS — B3731 Acute candidiasis of vulva and vagina: Secondary | ICD-10-CM | POA: Insufficient documentation

## 2021-11-14 DIAGNOSIS — M1710 Unilateral primary osteoarthritis, unspecified knee: Secondary | ICD-10-CM | POA: Diagnosis not present

## 2021-11-14 DIAGNOSIS — N898 Other specified noninflammatory disorders of vagina: Secondary | ICD-10-CM | POA: Diagnosis not present

## 2021-11-14 LAB — POCT URINALYSIS DIPSTICK
Bilirubin, UA: NEGATIVE
Blood, UA: NEGATIVE
Glucose, UA: NEGATIVE
Ketones, UA: NEGATIVE
Leukocytes, UA: NEGATIVE
Nitrite, UA: NEGATIVE
Protein, UA: NEGATIVE
Spec Grav, UA: 1.015 (ref 1.010–1.025)
Urobilinogen, UA: 0.2 E.U./dL
pH, UA: 6.5 (ref 5.0–8.0)

## 2021-11-14 MED ORDER — TRIAMCINOLONE ACETONIDE 0.1 % EX CREA: TOPICAL_CREAM | CUTANEOUS | 1 refills | Status: DC

## 2021-11-14 MED ORDER — HYDROXYZINE HCL 25 MG PO TABS: 25.0000 mg | ORAL_TABLET | Freq: Every evening | ORAL | 1 refills | Status: DC | PRN

## 2021-11-14 MED ORDER — FLUCONAZOLE 150 MG PO TABS: 150.0000 mg | ORAL_TABLET | Freq: Once | ORAL | 0 refills | Status: AC

## 2021-11-14 MED ORDER — IBUPROFEN 800 MG PO TABS: 800.0000 mg | ORAL_TABLET | Freq: Every evening | ORAL | 1 refills | Status: DC

## 2021-11-14 NOTE — Assessment & Plan Note (Signed)
Pulled for refill of steroid cream, eczema patch right shin

## 2021-11-14 NOTE — Assessment & Plan Note (Signed)
Treating as such for now--diflucan rx Unable to complete internal exam nuswab ordered ua - Advised pt likely atrophic vaginitis vs lichen sclerosus and would prefer she see GYN Referral pending nuswab results

## 2021-11-14 NOTE — Assessment & Plan Note (Signed)
Pulled for refill of ibuprofen, pt uses prn pain not daily

## 2021-11-14 NOTE — Progress Notes (Signed)
    I,Cathy Townsend,acting as a Education administrator for [employer], PA-C.,have documented all relevant documentation on the behalf of Cathy Kirschner, PA-C,as directed by  Cathy Kirschner, PA-C while in the presence of Cathy Kirschner, PA-C.   Acute Office Visit  Subjective:     Patient ID: Cathy Townsend, female    DOB: 02-Jan-1956, 66 y.o.   MRN: [id number]  Cc. Vaginal irritation  HPI [name] is a 66 y/o female who presents today with vaginal itching, irritation and occasional vaginal discharge x 1 week. Denies using any medications OTC. Denies being sexually active. Denies any urinary symptoms.       Objective:  Blood pressure 124/60, pulse 67, SpO2 100 %.   Physical Exam Vitals reviewed.  Constitutional:      Appearance: She is not ill-appearing.  HENT:     Head: Normocephalic.  Eyes:     Conjunctiva/sclera: Conjunctivae normal.  Cardiovascular:     Rate and Rhythm: Normal rate.  Pulmonary:     Effort: Pulmonary effort is normal. No respiratory distress.  Genitourinary:    Comments: Hypopigmentation to vulva Erythema to vaginal opening.  Internal exam deferred secondary to pt's inability to position  Neurological:     General: No focal deficit present.     Mental Status: She is alert and oriented to person, place, and time.  Psychiatric:        Mood and Affect: Mood normal.        Behavior: Behavior normal.    No results found for any visits on 11/14/21.      Assessment & Plan:   Problem List Items Addressed This Visit       Musculoskeletal and Integument   Primary osteoarthritis of knee    Pulled for refill of ibuprofen, pt uses prn pain not daily       Relevant Medications   ibuprofen (ADVIL) 800 MG tablet   Rash    Pulled for refill of steroid cream, eczema patch right shin       Relevant Medications   triamcinolone cream (KENALOG) 0.1 %     Genitourinary   Candidiasis, vagina - Primary    Treating as such for now--diflucan rx Unable to complete  internal exam nuswab ordered ua - Advised pt likely atrophic vaginitis vs lichen sclerosus and would prefer she see GYN Referral pending nuswab results       Relevant Medications   fluconazole (DIFLUCAN) 150 MG tablet   Other Visit Diagnoses     Vaginal discharge       Relevant Orders   POCT Urinalysis Dipstick   NuSwab Vaginitis Plus (VG+)   Pruritus       Relevant Medications   hydrOXYzine (ATARAX) 25 MG tablet        I, Cathy Kirschner, PA-C have reviewed all documentation for this visit. The documentation on  11/14/2021 for the exam, diagnosis, procedures, and orders are all accurate and complete.  Cathy Kirschner, PA-C [facility] [address] [place], [place], [address] Office: [phone] Fax: [phone]

## 2021-11-16 ENCOUNTER — Other Ambulatory Visit: Payer: Self-pay | Admitting: Physician Assistant

## 2021-11-16 DIAGNOSIS — N952 Postmenopausal atrophic vaginitis: Secondary | ICD-10-CM

## 2021-11-16 LAB — NUSWAB VAGINITIS PLUS (VG+)
Candida albicans, NAA: NEGATIVE
Candida glabrata, NAA: NEGATIVE
Chlamydia trachomatis, NAA: NEGATIVE
Neisseria gonorrhoeae, NAA: NEGATIVE
Trich vag by NAA: NEGATIVE

## 2021-11-16 LAB — SPECIMEN STATUS REPORT

## 2021-11-20 ENCOUNTER — Telehealth: Payer: Self-pay

## 2021-11-20 NOTE — Telephone Encounter (Signed)
[facility] referring for Atrophic vaginitis. Sch with ABC or MD. Cell phone on file not accepting calls. Home number I left message for patient to call back to be scheduled.

## 2021-11-21 NOTE — Telephone Encounter (Signed)
Patient is scheduled for 12/11/21 with JC

## 2021-11-29 ENCOUNTER — Ambulatory Visit: Payer: Self-pay

## 2021-11-29 NOTE — Telephone Encounter (Signed)
Summary: Nausea for about a week after she eats   Pt stated experiencing nausea for about a week after she eats.  Pt mentioned she has purchased medication from a pharmacy, but it's not helping.    Pt seeking clinical advice.      Chief Complaint: nausea Symptoms: nausea Frequency: 1 week Pertinent Negatives: Patient denies signs of dehydration, no other sx Disposition: '[]'$ ED /'[]'$ Urgent Care (no appt availability in office) / '[x]'$ Appointment(In office/virtual)/ '[]'$  [place] [facility]/ '[]'$ Home Care/ '[]'$ Refused Recommended Disposition /'[]'$ [place] [facility]/ '[]'$  Follow-up with PCP Additional Notes: Pt stated unable to come in office, due to her job. Accepted a phone visit tomorrow am with PCP. Pt does not have e-mail but does have access to smart phone.   Reason for Disposition  Nausea lasts > 1 week  Answer Assessment - Initial Assessment Questions 1. NAUSEA SEVERITY: "How bad is the nausea?" (e.g., mild, moderate, severe; dehydration, weight loss)   - MILD: loss of appetite without change in eating habits   - MODERATE: decreased oral intake without significant weight loss, dehydration, or malnutrition   - SEVERE: inadequate caloric or fluid intake, significant weight loss, symptoms of dehydration     mild 2. ONSET: "When did the nausea begin?"     1 week 3. VOMITING: "Any vomiting?" If Yes, ask: "How many times today?"     no 4. RECURRENT SYMPTOM: "Have you had nausea before?" If Yes, ask: "When was the last time?" "What happened that time?"     no 5. CAUSE: "What do you think is causing the nausea?"     Does not know  Protocols used: Nausea-A-AH

## 2021-11-30 ENCOUNTER — Telehealth: Payer: Medicare Other | Admitting: Physician Assistant

## 2021-12-04 ENCOUNTER — Telehealth: Payer: Self-pay | Admitting: Obstetrics

## 2021-12-04 ENCOUNTER — Other Ambulatory Visit: Payer: Self-pay

## 2021-12-04 ENCOUNTER — Other Ambulatory Visit: Payer: Self-pay | Admitting: Gastroenterology

## 2021-12-04 ENCOUNTER — Encounter: Payer: Self-pay | Admitting: Gastroenterology

## 2021-12-04 ENCOUNTER — Ambulatory Visit (INDEPENDENT_AMBULATORY_CARE_PROVIDER_SITE_OTHER): Payer: Medicare Other | Admitting: Gastroenterology

## 2021-12-04 VITALS — BP 129/83 | HR 79 | Temp 98.3°F | Wt 205.8 lb

## 2021-12-04 DIAGNOSIS — D509 Iron deficiency anemia, unspecified: Secondary | ICD-10-CM | POA: Diagnosis not present

## 2021-12-04 DIAGNOSIS — A048 Other specified bacterial intestinal infections: Secondary | ICD-10-CM | POA: Diagnosis not present

## 2021-12-04 NOTE — Telephone Encounter (Signed)
Pt did not want to switch the appoinment to 6/19.  She is keeping the originally scheduled appt.

## 2021-12-04 NOTE — Progress Notes (Signed)
Jonathon Bellows MD, MRCP(U.K) [address]  [place]  [place], [place] [address]  Main: [phone]  Fax: [phone]   Primary Care Physician: [name], PA-C  Primary Gastroenterologist:  Dr. Jonathon Bellows   Chief Complaint  Patient presents with   IDA    HPI: Cathy Townsend is a 66 y.o. female   Summary of history :  Initially referred and seen on 07/31/2021 for iron deficiency anemia.  No overt blood loss.  Was not on any iron supplementation at that time.  Hemoglobin 9.8 g with an MCV of 91 and ferritin of 27.  Interval history   07/31/2021-12/04/2021  08/01/2021: H. pylori breath test positive, commenced on Midodrine Based Triple Therapy, B12 Folate Normal, Urine Analysis Negative Celiac Serology Normal  08/21/2021: EGD: Erythema in the Prepyloric Region,5.  Biopsies Taken, Showed H. pylori associated gastritisColonoscopy showed poor prep of the colon large amount of stool seen repeat colonoscopy with a 2-day prep recommended  Doing well no complaints Current Outpatient Medications  Medication Sig Dispense Refill   aspirin EC 81 MG tablet Take 81 mg by mouth.     atorvastatin (LIPITOR) 20 MG tablet Take 1 tablet (20 mg total) by mouth daily. 90 tablet 3   cetirizine (ZYRTEC) 10 MG tablet Take 10 mg by mouth every morning.     ferrous sulfate 325 (65 FE) MG tablet Take 1 tablet (325 mg total) by mouth daily with breakfast. 90 tablet 3   fluticasone (FLONASE) 50 MCG/ACT nasal spray Place 2 sprays into both nostrils daily. 16 g 6   furosemide (LASIX) 20 MG tablet TAKE 1 TABLET (20 MG TOTAL) BY MOUTH DAILY AS NEEDED FOR EDEMA. 90 tablet 2   hydrOXYzine (ATARAX) 25 MG tablet Take 1 tablet (25 mg total) by mouth at bedtime as needed. 90 tablet 1   ibuprofen (ADVIL) 800 MG tablet Take 1 tablet (800 mg total) by mouth every evening. 90 tablet 1   montelukast (SINGULAIR) 10 MG tablet Take 1 tablet (10 mg total) by mouth daily. (Patient not taking: Reported on 11/14/2021) 90  tablet 3   omeprazole (PRILOSEC) 20 MG capsule TAKE 1 CAPSULE BY MOUTH DAILY FOR 14 DAYS. 28 capsule 0   polyethylene glycol-electrolytes (NULYTELY) 420 g solution Prepare according to package instructions. Starting at 5:00 PM: Drink one 8 oz glass of mixture every 15 minutes until you finish half of the jug. Five hours prior to procedure, drink 8 oz glass of mixture every 15 minutes until it is all gone. Make sure you do not drink anything 4 hours prior to your procedure. (Patient not taking: Reported on 11/14/2021) 4000 mL 0   triamcinolone (KENALOG) 0.025 % cream      No current facility-administered medications for this visit.    Allergies as of 12/04/2021 - Review Complete 11/14/2021  Allergen Reaction Noted   Oxycodone-acetaminophen Itching 01/24/2015   Tramadol Itching 04/04/2015   Meloxicam Rash 01/24/2015    ROS:  General: Negative for anorexia, weight loss, fever, chills, fatigue, weakness. ENT: Negative for hoarseness, difficulty swallowing , nasal congestion. CV: Negative for chest pain, angina, palpitations, dyspnea on exertion, peripheral edema.  Respiratory: Negative for dyspnea at rest, dyspnea on exertion, cough, sputum, wheezing.  GI: See history of present illness. GU:  Negative for dysuria, hematuria, urinary incontinence, urinary frequency, nocturnal urination.  Endo: Negative for unusual weight change.    Physical Examination:   There were no vitals taken for this visit.  General: Well-nourished, well-developed in  no acute distress.  Eyes: No icterus. Conjunctivae pink. Neuro: Alert and oriented x 3.  Grossly intact. Skin: Warm and dry, no jaundice.   Psych: Alert and cooperative, normal mood and affect.   Imaging Studies: No results found.  Assessment and Plan:   Cathy Townsend is a 66 y.o. y/o female here to follow-up for iron deficiency anemia normal B12 folate.  H. pylori positive treated with triple therapy.  EGD showed H. pylori related gastritis.   Colonoscopy was incomplete due to bowel prep was poor  Plan 1.  Check H. pylori for eradication 2.  Repeat colonoscopy with a 2-day prep was recommended but she absolutely does not want to go through the procedure she is aware that any underlying malignancy may not have been detected and I strongly suggest she proceed with colonoscopy despite our discussion she decided not to do so and is aware of the risks 3.  From the GI point of view I have nothing more to offer her since she is refusing to proceed with colonoscopy to evaluate for anemia.   I have discussed alternative options, risks & benefits,  which include, but are not limited to, bleeding, infection, perforation,respiratory complication & drug reaction.  The patient agrees with this plan & written consent will be obtained.       Dr Jonathon Bellows  MD,MRCP [facility]) Follow up as needed

## 2021-12-04 NOTE — Telephone Encounter (Signed)
Patient has appointment on 12/11/21 with Dr Sharlet Salina. Called patient to see if she would like to be seen on 12/04/2021

## 2021-12-05 LAB — H. PYLORI BREATH TEST: H pylori Breath Test: POSITIVE — AB

## 2021-12-05 NOTE — Progress Notes (Signed)
Still postitive for h pylori needs quadruple bismuth based therapy sent

## 2021-12-07 ENCOUNTER — Other Ambulatory Visit: Payer: Self-pay

## 2021-12-07 ENCOUNTER — Telehealth: Payer: Self-pay

## 2021-12-07 DIAGNOSIS — Z8619 Personal history of other infectious and parasitic diseases: Secondary | ICD-10-CM

## 2021-12-07 MED ORDER — BISMUTH SUBSALICYLATE 262 MG PO TABS: 1.0000 | ORAL_TABLET | Freq: Four times a day (QID) | ORAL | 0 refills | Status: AC

## 2021-12-07 MED ORDER — METRONIDAZOLE 250 MG PO TABS: 250.0000 mg | ORAL_TABLET | Freq: Four times a day (QID) | ORAL | 0 refills | Status: DC

## 2021-12-07 MED ORDER — TETRACYCLINE HCL 500 MG PO CAPS: 500.0000 mg | ORAL_CAPSULE | Freq: Four times a day (QID) | ORAL | 0 refills | Status: AC

## 2021-12-07 MED ORDER — OMEPRAZOLE 20 MG PO CPDR: 20.0000 mg | DELAYED_RELEASE_CAPSULE | Freq: Four times a day (QID) | ORAL | 0 refills | Status: DC

## 2021-12-07 NOTE — Telephone Encounter (Signed)
Called patient to let her know that she is still H Pylori positive. Patient stated that she did not know why she keeps being positive for H Pylori. She stated that she is always washing her hands. I told her that their is several reasons why she could of gotten H Pylori. Patient stated to go ahead and send in her prescriptions.   Dr. Vicente Males, patient has a medication contraindication: Meloxicam with BismuthSubsalicylate 884 MG. Any other medication that she could take? Or is it fine to send.

## 2021-12-07 NOTE — Telephone Encounter (Signed)
-----   Message from Jonathon Bellows, MD sent at 12/05/2021  6:02 PM EDT ----- Still postitive for h pylori needs quadruple bismuth based therapy sent

## 2021-12-11 ENCOUNTER — Encounter: Payer: Self-pay | Admitting: Obstetrics

## 2021-12-11 ENCOUNTER — Ambulatory Visit (INDEPENDENT_AMBULATORY_CARE_PROVIDER_SITE_OTHER): Payer: Medicare Other | Admitting: Obstetrics

## 2021-12-11 VITALS — BP 100/60 | Ht 62.0 in | Wt 205.0 lb

## 2021-12-11 DIAGNOSIS — F17200 Nicotine dependence, unspecified, uncomplicated: Secondary | ICD-10-CM

## 2021-12-11 DIAGNOSIS — L80 Vitiligo: Secondary | ICD-10-CM | POA: Diagnosis not present

## 2021-12-11 NOTE — Progress Notes (Addendum)
Chief Complaint  Patient presents with   Vaginal Itching    X 2 weeks, no discharge or odor   HPI: Patient Cathy Townsend is an 66 y.o. year old [id number] No LMP recorded. Patient is postmenopausal. who presents in consult from Alfredia Ferguson, PA-C for evaluation of vaginal itching. This has been going on 2 weeks. She was treated with diflucan and was rx triamcinolone cream but she does not like to take it for how it feels.  Nuswab was negative on 11/14/21. She has been treated for H Pylori.  Symptoms at present are none. Will come from time to time. Has to wipe for itching and it can get sore from wiping. Symptoms resolved after diflucan. She is not sexually active. Has dryness but it is not bothersome.  Patient denies any recent change in soap or detergent. Patient denies change in sexual partners. Patient denies history of pelvic infections. Patient denies domestic violence or sexual abuse. She is a smoker.   Review of Systems  Constitutional:  Positive for appetite change. Negative for activity change, chills, diaphoresis, fatigue, fever and unexpected weight change.  HENT:  Positive for sneezing. Negative for congestion, dental problem, drooling, ear discharge, ear pain, facial swelling, hearing loss, mouth sores, nosebleeds, postnasal drip, rhinorrhea, sinus pressure, sinus pain, sore throat, tinnitus, trouble swallowing and voice change.   Eyes:  Negative for photophobia, pain, discharge, redness, itching and visual disturbance.  Respiratory:  Positive for wheezing. Negative for apnea, cough, choking, chest tightness, shortness of breath and stridor.   Cardiovascular:  Negative for chest pain, palpitations and leg swelling.  Gastrointestinal:  Positive for nausea and vomiting. Negative for abdominal distention, abdominal pain, anal bleeding, blood in stool, constipation, diarrhea and rectal pain.  Endocrine: Negative for cold intolerance, heat intolerance, polydipsia, polyphagia and polyuria.   Genitourinary:  Negative for decreased urine volume, difficulty urinating, dyspareunia, dysuria, enuresis, flank pain, frequency, genital sores, hematuria, menstrual problem, pelvic pain, urgency, vaginal bleeding, vaginal discharge and vaginal pain.  Musculoskeletal:  Negative for arthralgias, back pain, gait problem, joint swelling, myalgias, neck pain and neck stiffness.  Skin:  Positive for rash. Negative for color change, pallor and wound.  Allergic/Immunologic: Negative for environmental allergies, food allergies and immunocompromised state.  Neurological:  Negative for dizziness, tremors, seizures, syncope, facial asymmetry, speech difficulty, weakness, light-headedness, numbness and headaches.  Hematological:  Negative for adenopathy. Does not bruise/bleed easily.  Psychiatric/Behavioral:  Negative for agitation, behavioral problems, confusion, decreased concentration, dysphoric mood, hallucinations, self-injury, sleep disturbance and suicidal ideas. The patient is not nervous/anxious and is not hyperactive.    Denies bleeding or cramping from below.  Menopausal since 50s Denies abnormal pap smears   Past Medical History:  Diagnosis Date   Allergy    Arthritis    "think I have it in both knees" (05/06/2012)   Hyperlipidemia    Hypertension    PONV (postoperative nausea and vomiting)    Past Surgical History:  Procedure Laterality Date   COLONOSCOPY WITH PROPOFOL N/A 07/13/2016   Procedure: COLONOSCOPY WITH PROPOFOL;  Surgeon: Wyline Mood, MD;  Location: [facility] ENDOSCOPY;  Service: Endoscopy;  Laterality: N/A;   COLONOSCOPY WITH PROPOFOL N/A 08/21/2021   Procedure: COLONOSCOPY WITH PROPOFOL;  Surgeon: Wyline Mood, MD;  Location: [facility] ENDOSCOPY;  Service: Gastroenterology;  Laterality: N/A;   DILATION AND CURETTAGE OF UTERUS     ESOPHAGOGASTRODUODENOSCOPY N/A 08/21/2021   Procedure: ESOPHAGOGASTRODUODENOSCOPY (EGD);  Surgeon: Wyline Mood, MD;  Location: [facility] ENDOSCOPY;  Service:  Gastroenterology;  Laterality: N/A;  JOINT REPLACEMENT     right knee   REPLACEMENT TOTAL KNEE  ~ 2008   right   TOTAL KNEE ARTHROPLASTY Left 02/10/2015   Procedure: TOTAL KNEE ARTHROPLASTY;  Surgeon: Kennedy Bucker, MD;  Location: [facility] ORS;  Service: Orthopedics;  Laterality: Left;   TOTAL KNEE REVISION  05/05/2012   Procedure: TOTAL KNEE REVISION;  Surgeon: Nestor Lewandowsky, MD;  Location: [facility] OR;  Service: Orthopedics;  Laterality: Right;   TOTAL THYROIDECTOMY  1990's   TUBAL LIGATION     Family History  Problem Relation Age of Onset   CAD Mother    Heart disease Mother    Colon cancer Father        41s   Heart attack Brother    Social History   Socioeconomic History   Marital status: Married    Spouse name: Not on file   Number of children: 2   Years of education: Not on file   Highest education level: High school graduate  Occupational History   Occupation: retired  Tobacco Use   Smoking status: Every Day    Packs/day: 0.50    Years: 40.00    Total pack years: 20.00    Types: Cigarettes   Smokeless tobacco: Never  Vaping Use   Vaping Use: Never used  Substance and Sexual Activity   Alcohol use: Yes    Alcohol/week: 7.0 standard drinks of alcohol    Types: 7 Cans of beer per week    Comment: 1/day   Drug use: No   Sexual activity: Not Currently    Birth control/protection: Post-menopausal  Other Topics Concern   Not on file  Social History Narrative   Not on file   Social Determinants of Health   Financial Resource Strain: Low Risk  (01/25/2020)   Overall Financial Resource Strain (CARDIA)    Difficulty of Paying Living Expenses: Not hard at all  Food Insecurity: No Food Insecurity (01/25/2020)   Hunger Vital Sign    Worried About Running Out of Food in the Last Year: Never true    Ran Out of Food in the Last Year: Never true  Transportation Needs: No Transportation Needs (01/25/2020)   PRAPARE - Administrator, Civil Service (Medical): No    Lack of  Transportation (Non-Medical): No  Physical Activity: Insufficiently Active (01/25/2020)   Exercise Vital Sign    Days of Exercise per Week: 2 days    Minutes of Exercise per Session: 20 min  Stress: No Stress Concern Present (01/25/2020)   [employer] of Occupational Health - Occupational Stress Questionnaire    Feeling of Stress : Not at all  Social Connections: Moderately Integrated (01/25/2020)   Social Connection and Isolation Panel [NHANES]    Frequency of Communication with Friends and Family: More than three times a week    Frequency of Social Gatherings with Friends and Family: More than three times a week    Attends Religious Services: More than 4 times per year    Active Member of [employer] or Organizations: No    Attends Banker Meetings: Never    Marital Status: Married  Catering manager Violence: Not At Risk (01/25/2020)   Humiliation, Afraid, Rape, and Kick questionnaire    Fear of Current or Ex-Partner: No    Emotionally Abused: No    Physically Abused: No    Sexually Abused: No   Medicine list and allergies reviewed and updated.    Objective:  BP 100/60   Ht  5\' 2"  (1.575 m)   Wt 205 lb (93 kg)   BMI 37.49 kg/m  Physical Exam Vitals reviewed. Exam conducted with a chaperone present.  Constitutional:      General: She is not in acute distress.    Appearance: Normal appearance. She is not ill-appearing.  HENT:     Head: Normocephalic and atraumatic.  Eyes:     Extraocular Movements: Extraocular movements intact.  Cardiovascular:     Rate and Rhythm: Normal rate.  Pulmonary:     Effort: Pulmonary effort is normal. No respiratory distress.  Genitourinary:    General: Normal vulva.     Exam position: Lithotomy position.     Pubic Area: No rash.      Labia:        Right: No rash, tenderness or lesion.        Left: No rash, tenderness or lesion.      Vagina: Normal. No vaginal discharge, tenderness or lesions.     Cervix: No discharge, friability,  lesion or cervical bleeding.     Uterus: Normal. Not enlarged, not tender and no uterine prolapse.      Adnexa:        Right: No tenderness or fullness.         Left: No tenderness or fullness.       Comments: Atrophic external genitalia with prominent vitiligo expanding to perianal region, non tender no signs of yeast/irritation  Musculoskeletal:        General: Normal range of motion.  Lymphadenopathy:     Lower Body: No right inguinal adenopathy. No left inguinal adenopathy.  Skin:    General: Skin is warm.  Neurological:     General: No focal deficit present.     Mental Status: She is alert and oriented to person, place, and time. Mental status is at baseline.     Coordination: Coordination normal.  Psychiatric:        Mood and Affect: Mood normal.        Behavior: Behavior normal.        Thought Content: Thought content normal.        Judgment: Judgment normal.     Assessment/Plan:   Vitiligo  Current smoker  Patient reports her symptoms resolved with treatment We discussed the vitiligo present but no management needs to be done for that Recommend switching to triamcinolone ointment when needed which she may tolerate better The [place] Preventive Services Task Force recommend yearly lung cancer screening with low-dose CT for people who-- Have a history of heavy smoking, and  Smoke now or have quit within the past 15 years, and  Are between 40 and 48 years old. Heavy smoking means a cigarette smoking history of 30 pack years or more. A pack year is smoking an average of one pack of cigarettes per day for one year. For example, a person could have a 30 pack-year history by smoking one pack a day for 30 years or two packs a day for 15 years Based on this patient does not qualify as she smokes 1/2 PPD and she is not interesting in testing.   Return if symptoms worsen or fail to improve.

## 2021-12-15 ENCOUNTER — Other Ambulatory Visit: Payer: Self-pay | Admitting: Gastroenterology

## 2021-12-20 ENCOUNTER — Telehealth: Payer: Self-pay | Admitting: Physician Assistant

## 2021-12-20 DIAGNOSIS — L299 Pruritus, unspecified: Secondary | ICD-10-CM

## 2021-12-20 NOTE — Telephone Encounter (Signed)
[facility] faxed refill request for the following medications:  hydrOXYzine (ATARAX) 25 MG tablet   Please advise.

## 2021-12-21 NOTE — Addendum Note (Signed)
Addended by: Barnie Mort on: 12/21/2021 09:05 AM   Modules accepted: Orders

## 2022-03-05 DIAGNOSIS — L2089 Other atopic dermatitis: Secondary | ICD-10-CM | POA: Diagnosis not present

## 2022-03-11 ENCOUNTER — Other Ambulatory Visit: Payer: Self-pay | Admitting: Family Medicine

## 2022-03-11 DIAGNOSIS — E7800 Pure hypercholesterolemia, unspecified: Secondary | ICD-10-CM

## 2022-03-19 ENCOUNTER — Other Ambulatory Visit: Payer: Self-pay | Admitting: Gastroenterology

## 2022-03-19 ENCOUNTER — Other Ambulatory Visit: Payer: Self-pay | Admitting: Physician Assistant

## 2022-03-19 DIAGNOSIS — L299 Pruritus, unspecified: Secondary | ICD-10-CM

## 2022-04-02 ENCOUNTER — Encounter: Payer: Medicare Other | Admitting: Physician Assistant

## 2022-04-24 ENCOUNTER — Ambulatory Visit (INDEPENDENT_AMBULATORY_CARE_PROVIDER_SITE_OTHER): Payer: Medicare Other | Admitting: Physician Assistant

## 2022-04-24 ENCOUNTER — Encounter: Payer: Self-pay | Admitting: Physician Assistant

## 2022-04-24 VITALS — BP 110/54 | HR 68 | Resp 16 | Ht 62.0 in | Wt 211.0 lb

## 2022-04-24 DIAGNOSIS — D649 Anemia, unspecified: Secondary | ICD-10-CM | POA: Diagnosis not present

## 2022-04-24 DIAGNOSIS — E559 Vitamin D deficiency, unspecified: Secondary | ICD-10-CM

## 2022-04-24 DIAGNOSIS — R609 Edema, unspecified: Secondary | ICD-10-CM | POA: Diagnosis not present

## 2022-04-24 DIAGNOSIS — Z0000 Encounter for general adult medical examination without abnormal findings: Secondary | ICD-10-CM | POA: Diagnosis not present

## 2022-04-24 DIAGNOSIS — R011 Cardiac murmur, unspecified: Secondary | ICD-10-CM

## 2022-04-24 DIAGNOSIS — R600 Localized edema: Secondary | ICD-10-CM

## 2022-04-24 DIAGNOSIS — E7800 Pure hypercholesterolemia, unspecified: Secondary | ICD-10-CM | POA: Diagnosis not present

## 2022-04-24 DIAGNOSIS — R946 Abnormal results of thyroid function studies: Secondary | ICD-10-CM | POA: Diagnosis not present

## 2022-04-24 DIAGNOSIS — Z720 Tobacco use: Secondary | ICD-10-CM

## 2022-04-24 DIAGNOSIS — Z23 Encounter for immunization: Secondary | ICD-10-CM | POA: Diagnosis not present

## 2022-04-24 DIAGNOSIS — R051 Acute cough: Secondary | ICD-10-CM

## 2022-04-24 DIAGNOSIS — N898 Other specified noninflammatory disorders of vagina: Secondary | ICD-10-CM

## 2022-04-24 MED ORDER — BENZONATATE 100 MG PO CAPS: 100.0000 mg | ORAL_CAPSULE | Freq: Two times a day (BID) | ORAL | 0 refills | Status: DC | PRN

## 2022-04-24 MED ORDER — TRIAMCINOLONE ACETONIDE 0.025 % EX OINT: 1.0000 | TOPICAL_OINTMENT | Freq: Two times a day (BID) | CUTANEOUS | 0 refills | Status: DC

## 2022-04-24 NOTE — Assessment & Plan Note (Signed)
Reviewed GYN note, they suggested switching to a triamcinolone ointment if cream is ineffective.  Sent in rx, advised BID use for 1 week and then stop for at least 2-3 weeks. If this does not help, advised returning back to GYN

## 2022-04-24 NOTE — Assessment & Plan Note (Signed)
Pt declines LDCT scan.  Current smoker. At least a 20 year pack history

## 2022-04-24 NOTE — Assessment & Plan Note (Signed)
Low, will repeat vit D labs

## 2022-04-24 NOTE — Assessment & Plan Note (Signed)
Managed with lipitor 20 mg  Repeat fasting lipids/cmp The 10-year ASCVD risk score (Arnett DK, et al., 2019) is: 12.7%  Pt takes a daily ASA 81 mg

## 2022-04-24 NOTE — Assessment & Plan Note (Addendum)
Pt reports she is s/p total thyroidectomy in 1990, but on chart review a CT angio saw a nodule on her thyroid?  On exam I appreciate thyromegaly vs scar tissue?  Will start with tsh/t4, consider ultrasound if changes; pt denies changes no issues since surgery

## 2022-04-24 NOTE — Progress Notes (Signed)
I,Tiffany J Bragg,acting as a Education administrator for [employer], PA-C.,have documented all relevant documentation on the behalf of Cathy Kirschner, PA-C,as directed by  Cathy Kirschner, PA-C while in the presence of Cathy Kirschner, PA-C.  Complete physical exam   [name]   DOB: Mar 03, 1956   66 y.o. Female  MRN: [id number] Visit Date: 04/24/2022  Today's healthcare provider: [name], PA-C   Chief Complaint  Patient presents with   Annual Exam   Subjective    Cathy Townsend is a 66 y.o. female who presents today for a complete physical exam.  She reports consuming a general diet. Home exercise routine includes stretching. She generally feels well. She reports sleeping well. She does have additional problems to discuss today.   Pt reports cough and congestion when she lies down at night. Denies chest pain, SOB. Cough is slightly productive, yellow mucous.   Past Medical History:  Diagnosis Date   Allergy    Arthritis    "think I have it in both knees" (05/06/2012)   Hyperlipidemia    Hypertension    PONV (postoperative nausea and vomiting)    Past Surgical History:  Procedure Laterality Date   COLONOSCOPY WITH PROPOFOL N/A 07/13/2016   Procedure: COLONOSCOPY WITH PROPOFOL;  Surgeon: Jonathon Bellows, MD;  Location: [facility] ENDOSCOPY;  Service: Endoscopy;  Laterality: N/A;   COLONOSCOPY WITH PROPOFOL N/A 08/21/2021   Procedure: COLONOSCOPY WITH PROPOFOL;  Surgeon: Jonathon Bellows, MD;  Location: [facility] ENDOSCOPY;  Service: Gastroenterology;  Laterality: N/A;   DILATION AND CURETTAGE OF UTERUS     ESOPHAGOGASTRODUODENOSCOPY N/A 08/21/2021   Procedure: ESOPHAGOGASTRODUODENOSCOPY (EGD);  Surgeon: Jonathon Bellows, MD;  Location: [facility] ENDOSCOPY;  Service: Gastroenterology;  Laterality: N/A;   JOINT REPLACEMENT     right knee   REPLACEMENT TOTAL KNEE  ~ 2008   right   TOTAL KNEE ARTHROPLASTY Left 02/10/2015   Procedure: TOTAL KNEE ARTHROPLASTY;  Surgeon: Hessie Knows, MD;  Location:  [facility] ORS;  Service: Orthopedics;  Laterality: Left;   TOTAL KNEE REVISION  05/05/2012   Procedure: TOTAL KNEE REVISION;  Surgeon: Kerin Salen, MD;  Location: [place];  Service: Orthopedics;  Laterality: Right;   TOTAL THYROIDECTOMY  1990's   TUBAL LIGATION     Social History   Socioeconomic History   Marital status: Married    Spouse name: Not on file   Number of children: 2   Years of education: Not on file   Highest education level: High school graduate  Occupational History   Occupation: retired  Tobacco Use   Smoking status: Every Day    Packs/day: 0.50    Years: 40.00    Total pack years: 20.00    Types: Cigarettes   Smokeless tobacco: Never  Vaping Use   Vaping Use: Never used  Substance and Sexual Activity   Alcohol use: Yes    Alcohol/week: 7.0 standard drinks of alcohol    Types: 7 Cans of beer per week    Comment: 1/day   Drug use: No   Sexual activity: Not Currently    Birth control/protection: Post-menopausal  Other Topics Concern   Not on file  Social History Narrative   Not on file   Social Determinants of Health   Financial Resource Strain: Low Risk  (01/25/2020)   Overall Financial Resource Strain (CARDIA)    Difficulty of Paying Living Expenses: Not hard at all  Food Insecurity: No Food Insecurity (01/25/2020)   Hunger Vital Sign  Worried About Charity fundraiser in the Last Year: Never true    [place] in the Last Year: Never true  Transportation Needs: No Transportation Needs (01/25/2020)   PRAPARE - Hydrologist (Medical): No    Lack of Transportation (Non-Medical): No  Physical Activity: Insufficiently Active (01/25/2020)   Exercise Vital Sign    Days of Exercise per Week: 2 days    Minutes of Exercise per Session: 20 min  Stress: No Stress Concern Present (01/25/2020)   [place]    Feeling of Stress : Not at all  Social Connections: Moderately  Integrated (01/25/2020)   Social Connection and Isolation Panel [NHANES]    Frequency of Communication with Friends and Family: More than three times a week    Frequency of Social Gatherings with Friends and Family: More than three times a week    Attends Religious Services: More than 4 times per year    Active Member of [employer] or Organizations: No    Attends Archivist Meetings: Never    Marital Status: Married  Human resources officer Violence: Not At Risk (01/25/2020)   Humiliation, Afraid, Rape, and Kick questionnaire    Fear of Current or Ex-Partner: No    Emotionally Abused: No    Physically Abused: No    Sexually Abused: No   Family Status  Relation Name Status   Mother  Alive   Father  Deceased at age 13   Sister  Deceased at age 26   Sister  Alive   Sister  Alive   Sister  85   Brother  Deceased at age 26   Brother  Deceased at age 48       gunshot wound   Daughter  Alive   Son  Alive   Family History  Problem Relation Age of Onset   CAD Mother    Heart disease Mother    Colon cancer Father        9s   Heart attack Brother    Allergies  Allergen Reactions   Oxycodone-Acetaminophen Itching   Tramadol Itching   Meloxicam Rash    Patient Care Team: Cathy Kirschner, PA-C as PCP - General (Physician Assistant) Odette Fraction (Optometry) Ree Edman, MD (Dermatology) Brita Romp, Dionne Bucy, MD as Consulting Physician (Family Medicine)   Medications: Outpatient Medications Prior to Visit  Medication Sig   aspirin EC 81 MG tablet Take 81 mg by mouth.   atorvastatin (LIPITOR) 20 MG tablet TAKE 1 TABLET BY MOUTH EVERY DAY   cetirizine (ZYRTEC) 10 MG tablet Take 10 mg by mouth every morning.   ferrous sulfate 325 (65 FE) MG tablet Take 1 tablet (325 mg total) by mouth daily with breakfast.   fluticasone (FLONASE) 50 MCG/ACT nasal spray Place 2 sprays into both nostrils daily.   furosemide (LASIX) 20 MG tablet TAKE 1 TABLET (20 MG TOTAL) BY MOUTH  DAILY AS NEEDED FOR EDEMA.   hydrOXYzine (ATARAX) 25 MG tablet TAKE 1 TABLET BY MOUTH AT BEDTIME AS NEEDED.   ibuprofen (ADVIL) 800 MG tablet Take 1 tablet (800 mg total) by mouth every evening.   metroNIDAZOLE (FLAGYL) 250 MG tablet Take 1 tablet (250 mg total) by mouth 4 (four) times daily.   montelukast (SINGULAIR) 10 MG tablet Take 1 tablet (10 mg total) by mouth daily.   omeprazole (PRILOSEC) 20 MG capsule Take 1 capsule (20 mg total) by mouth 4 (four)  times daily.   polyethylene glycol-electrolytes (NULYTELY) 420 g solution Prepare according to package instructions. Starting at 5:00 PM: Drink one 8 oz glass of mixture every 15 minutes until you finish half of the jug. Five hours prior to procedure, drink 8 oz glass of mixture every 15 minutes until it is all gone. Make sure you do not drink anything 4 hours prior to your procedure.   [DISCONTINUED] triamcinolone (KENALOG) 0.025 % cream    No facility-administered medications prior to visit.    Review of Systems  Constitutional:  Negative for fatigue and fever.  Respiratory:  Positive for cough. Negative for shortness of breath.   Cardiovascular:  Negative for chest pain and leg swelling.  Gastrointestinal:  Negative for abdominal pain.  Neurological:  Negative for dizziness and headaches.     Objective    BP (!) 110/54 (BP Location: Right Arm, Patient Position: Sitting, Cuff Size: Large)   Pulse 68   Resp 16   Ht '5\' 2"'$  (1.575 m)   Wt 211 lb (95.7 kg)   SpO2 100%   BMI 38.59 kg/m   Physical Exam Constitutional:      General: She is awake.     Appearance: She is well-developed. She is not ill-appearing.  HENT:     Head: Normocephalic.     Right Ear: Tympanic membrane normal.     Left Ear: Tympanic membrane normal.     Nose: Nose normal. No congestion or rhinorrhea.     Mouth/Throat:     Pharynx: No oropharyngeal exudate or posterior oropharyngeal erythema.  Eyes:     Conjunctiva/sclera: Conjunctivae normal.     Pupils:  Pupils are equal, round, and reactive to light.  Neck:     Thyroid: No thyroid mass or thyromegaly.  Cardiovascular:     Rate and Rhythm: Normal rate and regular rhythm.     Heart sounds: Normal heart sounds.  Pulmonary:     Effort: Pulmonary effort is normal.     Breath sounds: Normal breath sounds.  Musculoskeletal:     Right lower leg: No swelling. No edema.     Left lower leg: No swelling. No edema.  Lymphadenopathy:     Cervical: No cervical adenopathy.  Skin:    General: Skin is warm.  Neurological:     Mental Status: She is alert and oriented to person, place, and time.  Psychiatric:        Attention and Perception: Attention normal.        Mood and Affect: Mood normal.        Speech: Speech normal.        Behavior: Behavior normal. Behavior is cooperative.      Last depression screening scores    04/24/2022    8:54 AM 11/14/2021    9:46 AM 05/29/2021    1:58 PM  PHQ 2/9 Scores  PHQ - 2 Score 0 0 0  PHQ- 9 Score 0 0 0   Last fall risk screening    04/24/2022    8:54 AM  Fall Risk   Falls in the past year? 0  Number falls in past yr: 0  Injury with Fall? 0  Risk for fall due to : No Fall Risks  Follow up Falls evaluation completed   Last Audit-C alcohol use screening    04/24/2022    8:55 AM  Alcohol Use Disorder Test (AUDIT)  1. How often do you have a drink containing alcohol? 3  2. How many drinks containing alcohol  do you have on a typical day when you are drinking? 3  3. How often do you have six or more drinks on one occasion? 3  AUDIT-C Score 9   A score of 3 or more in women, and 4 or more in men indicates increased risk for alcohol abuse, EXCEPT if all of the points are from question 1   No results found for any visits on 04/24/22.  Assessment & Plan    Routine Health Maintenance and Physical Exam  Exercise Activities and Dietary recommendations --balanced diet high in fiber and protein, low in sugars, carbs, fats. --physical  activity/exercise 30 minutes 3-5 times a week    Immunization History  Administered Date(s) Administered   Fluad Quad(high Dose 65+) 03/31/2019, 04/24/2022   Influenza,inj,Quad PF,6+ Mos 05/20/2017, 05/26/2018   Influenza-Unspecified 03/14/2021   Moderna Sars-Covid-2 Vaccination 08/01/2019, 08/29/2019, 04/11/2020   PNEUMOCOCCAL CONJUGATE-20 04/24/2022   Pneumococcal Conjugate-13 01/25/2020   Pneumococcal Polysaccharide-23 04/06/2014   Td 05/28/2002   Tdap 11/09/2009   Zoster, Live 12/31/2013    Health Maintenance  Topic Date Due   Zoster Vaccines- Shingrix (1 of 2) Never done   MAMMOGRAM  09/06/2018   TETANUS/TDAP  11/10/2019   Lung Cancer Screening  12/07/2019   COVID-19 Vaccine (4 - Moderna series) 06/06/2020   COLONOSCOPY (Pts 45-44yr Insurance coverage will need to be confirmed)  08/22/2022   Medicare Annual Wellness (AWV)  04/25/2023   Pneumonia Vaccine 66 Years old  Completed   INFLUENZA VACCINE  Completed   DEXA SCAN  Completed   Hepatitis C Screening  Completed   HPV VACCINES  Aged Out    Discussed health benefits of physical activity, and encouraged her to engage in regular exercise appropriate for her age and condition.  Problem List Items Addressed This Visit       Endocrine   Abnormal thyroid exam    Pt reports she is s/p total thyroidectomy in 1990, but on chart review a CT angio saw a nodule on her thyroid?  On exam I appreciate thyromegaly vs scar tissue?  Will start with tsh/t4, consider ultrasound if changes; pt denies changes no issues since surgery      Relevant Orders   TSH + free T4     Genitourinary   Vaginal itching    Reviewed GYN note, they suggested switching to a triamcinolone ointment if cream is ineffective.  Sent in rx, advised BID use for 1 week and then stop for at least 2-3 weeks. If this does not help, advised returning back to GYN      Relevant Medications   triamcinolone (KENALOG) 0.025 % ointment     Other   Cardiac  murmur    Heard on exam, chronic and stable.  Given edema and cough, if BNP elevated or all labs WNR, consider ref to cardiology      Hypercholesterolemia without hypertriglyceridemia    Managed with lipitor 20 mg  Repeat fasting lipids/cmp The 10-year ASCVD risk score (Arnett DK, et al., 2019) is: 12.7%  Pt takes a daily ASA 81 mg      Relevant Orders   Lipid Profile   Comprehensive Metabolic Panel (CMET)   Avitaminosis D    Low, will repeat vit D labs      Relevant Orders   Vitamin D (25 hydroxy)   Tobacco use    Pt declines LDCT scan.  Current smoker. At least a 20 year pack history      Peripheral edema  Controlled w/ lasix Ordered BNP Again given all symptoms consider cardio referral pending bw      Relevant Medications   benzonatate (TESSALON) 100 MG capsule   Other Relevant Orders   B Nat Peptide   Anemia    Historically appears like IDA will repeat CBC and iron panel      Relevant Orders   Iron, TIBC and Ferritin Panel   CBC w/Diff/Platelet   Acute cough    Multifactorial, pt is a long-time smoker Rx tessalon today Given peripheral edema managed on lasix, will order bnp r/o CHF. May consider cardio referral if cough persists.       Relevant Medications   benzonatate (TESSALON) 100 MG capsule   Other Visit Diagnoses     Encounter for annual wellness exam in Medicare patient    -  Primary   Annual physical exam       Need for pneumococcal 20-valent conjugate vaccination       Relevant Orders   Pneumococcal conjugate vaccine 20-valent (Prevnar 20) (Completed)   Need for influenza vaccination       Relevant Orders   Flu Vaccine QUAD High Dose(Fluad) (Completed)       Reviewed screening  -- declines mammogram, LDCT scan for lung cancer screening.   Return in about 6 months (around 10/23/2022) for chronic conditions.     I, Cathy Kirschner, PA-C have reviewed all documentation for this visit. The documentation on  04/24/2022 for the exam,  diagnosis, procedures, and orders are all accurate and complete.  Cathy Kirschner, PA-C [facility] [address] [place], [place], [address] Office: [phone] Fax: [place]

## 2022-04-24 NOTE — Assessment & Plan Note (Signed)
Multifactorial, pt is a long-time smoker Rx tessalon today Given peripheral edema managed on lasix, will order bnp r/o CHF. May consider cardio referral if cough persists.

## 2022-04-24 NOTE — Assessment & Plan Note (Signed)
Controlled w/ lasix Ordered BNP Again given all symptoms consider cardio referral pending bw

## 2022-04-24 NOTE — Assessment & Plan Note (Signed)
Historically appears like IDA will repeat CBC and iron panel

## 2022-04-24 NOTE — Assessment & Plan Note (Signed)
Heard on exam, chronic and stable.  Given edema and cough, if BNP elevated or all labs WNR, consider ref to cardiology

## 2022-04-24 NOTE — Assessment & Plan Note (Signed)
>>  ASSESSMENT AND PLAN FOR HYPERCHOLESTEROLEMIA WITHOUT HYPERTRIGLYCERIDEMIA WRITTEN ON 04/24/2022 12:28 PM BY DRUBEL, LINDSAY, PA-C  Managed with lipitor 20 mg  Repeat fasting lipids/cmp The 10-year ASCVD risk score (Arnett DK, et al., 2019) is: 12.7%  Pt takes a daily ASA 81 mg

## 2022-04-26 ENCOUNTER — Telehealth: Payer: Self-pay

## 2022-04-26 NOTE — Telephone Encounter (Signed)
Copied from [place] [id number]. Topic: General - Inquiry >> Apr 26, 2022 11:46 AM Penni Bombard wrote: Reason for CRM: pt would like someone to call her back [place] her labs  CB@ [phone]

## 2022-04-26 NOTE — Telephone Encounter (Signed)
Please advise labs ?

## 2022-04-27 ENCOUNTER — Ambulatory Visit: Payer: Self-pay

## 2022-04-27 LAB — CBC WITH DIFFERENTIAL/PLATELET
Basophils Absolute: 0 10*3/uL (ref 0.0–0.2)
Basos: 1 %
EOS (ABSOLUTE): 0.2 10*3/uL (ref 0.0–0.4)
Eos: 6 %
Hematocrit: 35.5 % (ref 34.0–46.6)
Hemoglobin: 11.8 g/dL (ref 11.1–15.9)
Immature Grans (Abs): 0 10*3/uL (ref 0.0–0.1)
Immature Granulocytes: 1 %
Lymphocytes Absolute: 1.2 10*3/uL (ref 0.7–3.1)
Lymphs: 32 %
MCH: 30.3 pg (ref 26.6–33.0)
MCHC: 33.2 g/dL (ref 31.5–35.7)
MCV: 91 fL (ref 79–97)
Monocytes Absolute: 0.5 10*3/uL (ref 0.1–0.9)
Monocytes: 13 %
Neutrophils Absolute: 1.8 10*3/uL (ref 1.4–7.0)
Neutrophils: 47 %
Platelets: 307 10*3/uL (ref 150–450)
RBC: 3.9 x10E6/uL (ref 3.77–5.28)
RDW: 13.3 % (ref 11.7–15.4)
WBC: 3.9 10*3/uL (ref 3.4–10.8)

## 2022-04-27 LAB — LIPID PANEL
Chol/HDL Ratio: 2.5 ratio (ref 0.0–4.4)
Cholesterol, Total: 188 mg/dL (ref 100–199)
HDL: 75 mg/dL (ref >39)
LDL Chol Calc (NIH): 102 mg/dL — ABNORMAL HIGH (ref 0–99)
Triglycerides: 55 mg/dL (ref 0–149)
VLDL Cholesterol Cal: 11 mg/dL (ref 5–40)

## 2022-04-27 LAB — COMPREHENSIVE METABOLIC PANEL
ALT: 15 IU/L (ref 0–32)
AST: 20 IU/L (ref 0–40)
Albumin/Globulin Ratio: 1.7 (ref 1.2–2.2)
Albumin: 4.3 g/dL (ref 3.9–4.9)
Alkaline Phosphatase: 88 IU/L (ref 44–121)
BUN/Creatinine Ratio: 25 (ref 12–28)
BUN: 15 mg/dL (ref 8–27)
Bilirubin Total: 0.4 mg/dL (ref 0.0–1.2)
CO2: 22 mmol/L (ref 20–29)
Calcium: 9.9 mg/dL (ref 8.7–10.3)
Chloride: 105 mmol/L (ref 96–106)
Creatinine, Ser: 0.61 mg/dL (ref 0.57–1.00)
Globulin, Total: 2.6 g/dL (ref 1.5–4.5)
Glucose: 82 mg/dL (ref 70–99)
Potassium: 4.3 mmol/L (ref 3.5–5.2)
Sodium: 140 mmol/L (ref 134–144)
Total Protein: 6.9 g/dL (ref 6.0–8.5)
eGFR: 97 mL/min/{1.73_m2} (ref >59)

## 2022-04-27 LAB — VITAMIN D 25 HYDROXY (VIT D DEFICIENCY, FRACTURES): Vit D, 25-Hydroxy: 30.5 ng/mL (ref 30.0–100.0)

## 2022-04-27 LAB — IRON,TIBC AND FERRITIN PANEL
Ferritin: 61 ng/mL (ref 15–150)
Iron Saturation: 25 % (ref 15–55)
Iron: 85 ug/dL (ref 27–139)
Total Iron Binding Capacity: 338 ug/dL (ref 250–450)
UIBC: 253 ug/dL (ref 118–369)

## 2022-04-27 LAB — TSH+FREE T4
Free T4: 1.2 ng/dL (ref 0.82–1.77)
TSH: 1.1 u[IU]/mL (ref 0.450–4.500)

## 2022-04-27 LAB — BRAIN NATRIURETIC PEPTIDE: BNP: 58.3 pg/mL (ref 0.0–100.0)

## 2022-04-27 NOTE — Telephone Encounter (Signed)
Pt given lab results per notes of Ria Comment, [place] on 11/10. Pt verbalized understanding.   Summary: lab resulsts   Patient needs call back for lab results

## 2022-05-08 ENCOUNTER — Other Ambulatory Visit: Payer: Self-pay | Admitting: Family Medicine

## 2022-05-08 DIAGNOSIS — E611 Iron deficiency: Secondary | ICD-10-CM

## 2022-05-15 ENCOUNTER — Ambulatory Visit: Payer: Self-pay | Admitting: *Deleted

## 2022-05-15 ENCOUNTER — Other Ambulatory Visit: Payer: Self-pay | Admitting: Physician Assistant

## 2022-05-15 DIAGNOSIS — R051 Acute cough: Secondary | ICD-10-CM

## 2022-05-15 MED ORDER — HYDROCOD POLI-CHLORPHE POLI ER 10-8 MG/5ML PO SUER: 5.0000 mL | Freq: Every evening | ORAL | 0 refills | Status: DC | PRN

## 2022-05-15 NOTE — Telephone Encounter (Signed)
Pt was given pills to help with her cough but she is asking for cough syrup due to having drainage at night which causes the cough to be worse/ please advise

## 2022-05-15 NOTE — Telephone Encounter (Signed)
Try calling patient to let her know cough medication was sent in. [place] for [facility] nurse to advise if patient calls back.

## 2022-05-15 NOTE — Telephone Encounter (Signed)
Reason for Disposition  Cough  Answer Assessment - Initial Assessment Questions 1. ONSET: "When did the cough begin?"      The cough is worse.   It got better after the medications but now it's come back especially at night.   I cough bad at night.   It's hard to sleep.   She is having drainage down the back of her throat.    Denies other symptoms. 2. SEVERITY: "How bad is the cough today?"      Bad at night.   Can she prescribe me some cough syrup like they have given me before for the night time coughing? 3. SPUTUM: "Describe the color of your sputum" (none, dry cough; clear, white, yellow, green)     Very little coming up.    Mostly dry. 4. HEMOPTYSIS: "Are you coughing up any blood?" If so ask: "How much?" (flecks, streaks, tablespoons, etc.)     Not asked 5. DIFFICULTY BREATHING: "Are you having difficulty breathing?" If Yes, ask: "How bad is it?" (e.g., mild, moderate, severe)    - MILD: No SOB at rest, mild SOB with walking, speaks normally in sentences, can lie down, no retractions, pulse < 100.    - MODERATE: SOB at rest, SOB with minimal exertion and prefers to sit, cannot lie down flat, speaks in phrases, mild retractions, audible wheezing, pulse 100-120.    - SEVERE: Very SOB at rest, speaks in single words, struggling to breathe, sitting hunched forward, retractions, pulse > 120      No 6. FEVER: "Do you have a fever?" If Yes, ask: "What is your temperature, how was it measured, and when did it start?"     No 7. CARDIAC HISTORY: "Do you have any history of heart disease?" (e.g., heart attack, congestive heart failure)      Not asked 8. LUNG HISTORY: "Do you have any history of lung disease?"  (e.g., pulmonary embolus, asthma, emphysema)     Not asked 9. PE RISK FACTORS: "Do you have a history of blood clots?" (or: recent major surgery, recent prolonged travel, bedridden)     Not asked 10. OTHER SYMPTOMS: "Do you have any other symptoms?" (e.g., runny nose, wheezing, chest  pain)       Denies other symptoms other than drainage down the back of her throat which is worse at night. 11. PREGNANCY: "Is there any chance you are pregnant?" "When was your last menstrual period?"       N/A 12. TRAVEL: "Have you traveled out of the country in the last month?" (e.g., travel history, exposures)       N/A  Protocols used: Cough - Acute Non-Productive-A-AH  Chief Complaint: Pt is better after taking medications prescribed for her.   But the cough at night is still bothering her where she can't sleep.   Requesting the cough syrup for the night time coughing instead of the [place]. Symptoms: Coughing at night.   Mostly non productive Frequency: night time Pertinent Negatives: Patient denies n/A Disposition: '[]'$ ED /'[]'$ Urgent Care (no appt availability in office) / '[]'$ Appointment(In office/virtual)/ '[]'$  [facility]/ '[x]'$ Home Care/ '[]'$ Refused Recommended Disposition /'[]'$ [facility]/ '[]'$  Follow-up with PCP Additional Notes: Requesting cough med for night time coughing.

## 2022-06-04 DIAGNOSIS — Z79899 Other long term (current) drug therapy: Secondary | ICD-10-CM | POA: Diagnosis not present

## 2022-06-04 DIAGNOSIS — L2089 Other atopic dermatitis: Secondary | ICD-10-CM | POA: Diagnosis not present

## 2022-06-07 ENCOUNTER — Telehealth: Payer: Self-pay | Admitting: Physician Assistant

## 2022-06-07 DIAGNOSIS — E7800 Pure hypercholesterolemia, unspecified: Secondary | ICD-10-CM

## 2022-06-07 NOTE — Telephone Encounter (Signed)
[facility] pharmacy faxed refill request for the following medications:   furosemide (LASIX) 20 MG tablet     atorvastatin (LIPITOR) 20 MG tablet  Please advise

## 2022-06-12 MED ORDER — FUROSEMIDE 20 MG PO TABS: 20.0000 mg | ORAL_TABLET | Freq: Every day | ORAL | 2 refills | Status: DC | PRN

## 2022-06-12 MED ORDER — ATORVASTATIN CALCIUM 20 MG PO TABS: 20.0000 mg | ORAL_TABLET | Freq: Every day | ORAL | 2 refills | Status: DC

## 2022-06-26 ENCOUNTER — Telehealth: Payer: Self-pay | Admitting: Physician Assistant

## 2022-06-26 DIAGNOSIS — R600 Localized edema: Secondary | ICD-10-CM

## 2022-06-26 NOTE — Telephone Encounter (Signed)
[facility] pharmacy faxed refill request for the following medications:    furosemide (LASIX) 20 MG tablet   Please advise

## 2022-06-26 NOTE — Telephone Encounter (Signed)
LOV 04/24/22 NOV not scheduled LRF 06/12/22 #90 2RF

## 2022-06-28 ENCOUNTER — Other Ambulatory Visit: Payer: Self-pay | Admitting: Physician Assistant

## 2022-06-28 DIAGNOSIS — M1710 Unilateral primary osteoarthritis, unspecified knee: Secondary | ICD-10-CM

## 2022-06-28 NOTE — Telephone Encounter (Signed)
We received another refill request for furosemide (LASIX) 20 MG tablet from [facility].   It looks like there is another request from 06/07/2022 from [facility] also but it looks like it was sent to [facility].  Can we send to the correct pharmacy.

## 2022-06-28 NOTE — Telephone Encounter (Signed)
Spoke with pharmacy and they stated they received a request for rx to be sent to [facility] and transferred rx out Rx was transferred on 06/21/21

## 2022-07-06 ENCOUNTER — Ambulatory Visit: Payer: Self-pay | Admitting: *Deleted

## 2022-07-06 NOTE — Telephone Encounter (Signed)
Summary: cough / rx req   The patient has experienced cough and congestion for roughly a week  The patient has been prescribed medication previously but shares that it has been ineffective  The patient would like to be prescribed an alternative  Please contact the patient further when possible         Attempted to call patient- no answer and call can not be completed at this time message. Unable to leave call back message.

## 2022-07-06 NOTE — Telephone Encounter (Signed)
Summary: cough / rx req   The patient has experienced cough and congestion for roughly a week  The patient has been prescribed medication previously but shares that it has been ineffective  The patient would like to be prescribed an alternative  Please contact the patient further when possible      Called pt at home number  - [facility]

## 2022-07-06 NOTE — Telephone Encounter (Signed)
Summary: cough / rx req   The patient has experienced cough and congestion for roughly a week  The patient has been prescribed medication previously but shares that it has been ineffective  The patient would like to be prescribed an alternative  Please contact the patient further when possible      Call cannot be completed at this time.

## 2022-07-09 ENCOUNTER — Telehealth: Payer: Self-pay

## 2022-07-09 NOTE — Telephone Encounter (Signed)
Copied from [place] [id number]. Topic: General - Inquiry >> Jul 09, 2022  8:39 AM Marcellus Scott wrote: Reason for CRM: Pt is calling to follow up in regards to her Rx request. Please see TE from NT 07/06/2022.  Please advise.

## 2022-07-10 NOTE — Telephone Encounter (Signed)
Spoke to pt  and offered to schedule an appt  for cough/cold, but Pt declined pt stated --my schedules is full and will call the office back if needed.

## 2022-08-27 ENCOUNTER — Other Ambulatory Visit: Payer: Self-pay | Admitting: Physician Assistant

## 2022-08-27 DIAGNOSIS — E7800 Pure hypercholesterolemia, unspecified: Secondary | ICD-10-CM

## 2022-08-27 DIAGNOSIS — E611 Iron deficiency: Secondary | ICD-10-CM

## 2022-08-27 NOTE — Telephone Encounter (Unsigned)
Copied from [place] [id number]. Topic: General - Other >> Aug 27, 2022  8:47 AM Everette C wrote: Reason for CRM: Medication Refill - Medication: furosemide (LASIX) 20 MG tablet [id number]  ferrous sulfate 325 (65 FE) MG tablet [id number]  atorvastatin (LIPITOR) 20 MG tablet [id number]  Has the patient contacted their pharmacy? Yes.   (Agent: If no, request that the patient contact the pharmacy for the refill. If patient does not wish to contact the pharmacy document the reason why and proceed with request.) (Agent: If yes, when and what did the pharmacy advise?)  Preferred Pharmacy (with phone number or street name[id number] [address] [id number]: [phone]: [phone]: Not open 24 hours   Has the patient been seen for an appointment in the last year OR does the patient have an upcoming appointment? Yes.    Agent: Please be advised that RX refills may take up to 3 business days. We ask that you follow-up with your pharmacy.

## 2022-08-28 MED ORDER — ATORVASTATIN CALCIUM 20 MG PO TABS: 20.0000 mg | ORAL_TABLET | Freq: Every day | ORAL | 1 refills | Status: DC

## 2022-08-28 MED ORDER — FERROUS SULFATE 325 (65 FE) MG PO TABS: ORAL_TABLET | ORAL | 2 refills | Status: DC

## 2022-08-28 MED ORDER — FUROSEMIDE 20 MG PO TABS: 20.0000 mg | ORAL_TABLET | Freq: Every day | ORAL | 1 refills | Status: DC | PRN

## 2022-08-28 NOTE — Telephone Encounter (Signed)
Pt called in checking on her refills and states she is out of her fluid pills and also the one she takes for itching.

## 2022-08-28 NOTE — Telephone Encounter (Signed)
Request for local pharmacy- previous mail order use. Remainder of Rx forwarded Requested Prescriptions  Pending Prescriptions Disp Refills   atorvastatin (LIPITOR) 20 MG tablet 90 tablet 2    Sig: Take 1 tablet (20 mg total) by mouth daily.     Cardiovascular:  Antilipid - Statins Failed - 08/27/2022  9:46 AM      Failed - Lipid Panel in normal range within the last 12 months    Cholesterol, Total  Date Value Ref Range Status  04/24/2022 188 100 - 199 mg/dL Final   LDL Cholesterol (Calc)  Date Value Ref Range Status  05/20/2017 139 (H) mg/dL (calc) Final    Comment:    Reference range: <100 . Desirable range <100 mg/dL for primary prevention;   <70 mg/dL for patients with CHD or diabetic patients  with > or = 2 CHD risk factors. Marland Kitchen LDL-C is now calculated using the Martin-Hopkins  calculation, which is a validated novel method providing  better accuracy than the Friedewald equation in the  estimation of LDL-C.  Cresenciano Genre et al. Annamaria Helling. [id number]): 2061-2068  (http://education.[facility].com/faq/FAQ164)    LDL Chol Calc (NIH)  Date Value Ref Range Status  04/24/2022 102 (H) 0 - 99 mg/dL Final   HDL  Date Value Ref Range Status  04/24/2022 75 >39 mg/dL Final   Triglycerides  Date Value Ref Range Status  04/24/2022 55 0 - 149 mg/dL Final         Passed - Patient is not pregnant      Passed - Valid encounter within last 12 months    Recent Outpatient Visits           4 months ago Encounter for annual wellness exam in Medicare patient   [place] Thedore Mins, [place], PA-C   9 months ago Candidiasis, vagina   [place] [name], PA-C   1 year ago Localized edema   [place] Myles Gip, DO   1 year ago Encounter for [employer] annual wellness exam   [place], Dionne Bucy, MD   1 year ago [place], Dionne Bucy, MD               ferrous sulfate 325 (65 FE) MG tablet 90 tablet 3     Endocrinology:  Minerals - Iron Supplementation Passed - 08/27/2022  9:46 AM      Passed - HGB in normal range and within 360 days    Hemoglobin  Date Value Ref Range Status  04/24/2022 11.8 11.1 - 15.9 g/dL Final         Passed - HCT in normal range and within 360 days    Hematocrit  Date Value Ref Range Status  04/24/2022 35.5 34.0 - 46.6 % Final         Passed - RBC in normal range and within 360 days    RBC  Date Value Ref Range Status  04/24/2022 3.90 3.77 - 5.28 x10E6/uL Final  12/07/2018 3.94 3.87 - 5.11 MIL/uL Final         Passed - Fe (serum) in normal range and within 360 days    Iron  Date Value Ref Range Status  04/24/2022 85 27 - 139 ug/dL Final   Iron Saturation  Date Value Ref Range Status  04/24/2022 25 15 - 55 % Final         Passed -  Ferritin in normal range and within 360 days    Ferritin  Date Value Ref Range Status  04/24/2022 61 15 - 150 ng/mL Final         Passed - Valid encounter within last 12 months    Recent Outpatient Visits           4 months ago Encounter for annual wellness exam in Medicare patient   [place] [name], PA-C   9 months ago Candidiasis, vagina   [place] [name], PA-C   1 year ago Localized edema   [place], DO   1 year ago Encounter for [employer] annual wellness exam   [place], Dionne Bucy, MD   1 year ago [place], Dionne Bucy, MD               furosemide (LASIX) 20 MG tablet 90 tablet 2    Sig: Take 1 tablet (20 mg total) by mouth daily as needed for edema.     Cardiovascular:  Diuretics - Loop Failed - 08/27/2022  9:46 AM      Failed - Mg Level in normal range and within 180 days    No  results found for: "MG"       Passed - K in normal range and within 180 days    Potassium  Date Value Ref Range Status  04/24/2022 4.3 3.5 - 5.2 mmol/L Final  02/10/2015 3.9 mmol/L Final         Passed - Ca in normal range and within 180 days    Calcium  Date Value Ref Range Status  04/24/2022 9.9 8.7 - 10.3 mg/dL Final         Passed - Na in normal range and within 180 days    Sodium  Date Value Ref Range Status  04/24/2022 140 134 - 144 mmol/L Final         Passed - Cr in normal range and within 180 days    Creatinine, Ser  Date Value Ref Range Status  04/24/2022 0.61 0.57 - 1.00 mg/dL Final         Passed - Cl in normal range and within 180 days    Chloride  Date Value Ref Range Status  04/24/2022 105 96 - 106 mmol/L Final         Passed - Last BP in normal range    BP Readings from Last 1 Encounters:  04/24/22 (!) 110/54         Passed - Valid encounter within last 6 months    Recent Outpatient Visits           4 months ago Encounter for annual wellness exam in Medicare patient   [place], [place], PA-C   9 months ago Candidiasis, vagina   [place] [name], PA-C   1 year ago Localized edema   [place], [place], DO   1 year ago Encounter for [employer] annual wellness exam   [place], Dionne Bucy, MD   1 year ago COVID-19   [facility] [place], Dionne Bucy, MD

## 2022-10-05 ENCOUNTER — Other Ambulatory Visit: Payer: Self-pay | Admitting: Physician Assistant

## 2022-10-05 DIAGNOSIS — N898 Other specified noninflammatory disorders of vagina: Secondary | ICD-10-CM

## 2022-10-30 ENCOUNTER — Encounter: Payer: Self-pay | Admitting: Physician Assistant

## 2022-10-30 ENCOUNTER — Ambulatory Visit (INDEPENDENT_AMBULATORY_CARE_PROVIDER_SITE_OTHER): Payer: Medicare Other | Admitting: Physician Assistant

## 2022-10-30 VITALS — BP 107/65 | HR 90

## 2022-10-30 DIAGNOSIS — K219 Gastro-esophageal reflux disease without esophagitis: Secondary | ICD-10-CM

## 2022-10-30 DIAGNOSIS — R110 Nausea: Secondary | ICD-10-CM | POA: Diagnosis not present

## 2022-10-30 DIAGNOSIS — R011 Cardiac murmur, unspecified: Secondary | ICD-10-CM

## 2022-10-30 DIAGNOSIS — I959 Hypotension, unspecified: Secondary | ICD-10-CM

## 2022-10-30 MED ORDER — OMEPRAZOLE 20 MG PO CPDR
20.0000 mg | DELAYED_RELEASE_CAPSULE | Freq: Every day | ORAL | 3 refills | Status: DC
Start: 2022-10-30 — End: 2023-04-29

## 2022-10-30 MED ORDER — ONDANSETRON HCL 4 MG PO TABS: 4.0000 mg | ORAL_TABLET | Freq: Three times a day (TID) | ORAL | 0 refills | Status: DC | PRN

## 2022-10-30 NOTE — Progress Notes (Signed)
I,Sha'taria Tyson,acting as a Neurosurgeon for [employer], PA-C.,have documented all relevant documentation on the behalf of Alfredia Ferguson, PA-C,as directed by  Alfredia Ferguson, PA-C while in the presence of Alfredia Ferguson, PA-C.   Established patient visit   [name]   DOB: 09/09/1955   67 y.o. Female  MRN: [id number] Visit Date: 10/30/2022  Today's healthcare provider: Alfredia Ferguson, PA-C   Cc. Nausea x 2 weeks  Subjective    HPI  Pt reports being nauseous after eating x 2 week. She feels worse today with chills, had Shingles vaccine yesterday. Reports she tried a family member's nausea medication which helped for a day.  Denies fevers. Reports nasal congestion but does not like using nasal sprays. Takes zyrtec daily. Denies abdominal pain, change in BM, denies dysuria, urinary frequency.   Medications: Outpatient Medications Prior to Visit  Medication Sig   aspirin EC 81 MG tablet Take 81 mg by mouth.   atorvastatin (LIPITOR) 20 MG tablet Take 1 tablet (20 mg total) by mouth daily.   benzonatate (TESSALON) 100 MG capsule Take 1 capsule (100 mg total) by mouth 2 (two) times daily as needed for cough.   cetirizine (ZYRTEC) 10 MG tablet Take 10 mg by mouth every morning.   ferrous sulfate 325 (65 FE) MG tablet TAKE 1 TABLET BY MOUTH EVERY DAY WITH BREAKFAST   fluticasone (FLONASE) 50 MCG/ACT nasal spray Place 2 sprays into both nostrils daily.   furosemide (LASIX) 20 MG tablet Take 1 tablet (20 mg total) by mouth daily as needed for edema.   hydrOXYzine (ATARAX) 25 MG tablet TAKE 1 TABLET BY MOUTH AT BEDTIME AS NEEDED.   ibuprofen (ADVIL) 800 MG tablet TAKE 1 TABLET (800 MG TOTAL) BY MOUTH EVERY EVENING.   montelukast (SINGULAIR) 10 MG tablet Take 1 tablet (10 mg total) by mouth daily.   polyethylene glycol-electrolytes (NULYTELY) 420 g solution Prepare according to package instructions. Starting at 5:00 PM: Drink one 8 oz glass of mixture every 15 minutes until  you finish half of the jug. Five hours prior to procedure, drink 8 oz glass of mixture every 15 minutes until it is all gone. Make sure you do not drink anything 4 hours prior to your procedure.   triamcinolone (KENALOG) 0.025 % ointment APPLY TO AFFECTED AREA TWICE A DAY   [DISCONTINUED] chlorpheniramine-HYDROcodone (TUSSIONEX) 10-8 MG/5ML Take 5 mLs by mouth at bedtime as needed for cough. Do not take with hydroxyzine   [DISCONTINUED] metroNIDAZOLE (FLAGYL) 250 MG tablet Take 1 tablet (250 mg total) by mouth 4 (four) times daily.   [DISCONTINUED] omeprazole (PRILOSEC) 20 MG capsule Take 1 capsule (20 mg total) by mouth 4 (four) times daily.   No facility-administered medications prior to visit.    Review of Systems  Constitutional:  Negative for fatigue and fever.  HENT:  Positive for rhinorrhea.   Respiratory:  Negative for cough and shortness of breath.   Cardiovascular:  Negative for chest pain and leg swelling.  Gastrointestinal:  Positive for nausea. Negative for abdominal pain.  Neurological:  Negative for dizziness and headaches.       Objective    BP 92/61 (BP Location: Left Arm, Patient Position: Sitting, Cuff Size: Normal)   Pulse 90   SpO2 100%   Physical Exam Constitutional:      General: She is awake.     Appearance: She is well-developed.  HENT:     Head: Normocephalic.  Eyes:     Conjunctiva/sclera: Conjunctivae  normal.  Cardiovascular:     Rate and Rhythm: Normal rate and regular rhythm.     Heart sounds: Normal heart sounds.  Pulmonary:     Effort: Pulmonary effort is normal.     Breath sounds: Normal breath sounds.  Skin:    General: Skin is warm.  Neurological:     Mental Status: She is alert and oriented to person, place, and time.  Psychiatric:        Attention and Perception: Attention normal.        Mood and Affect: Mood normal.        Speech: Speech normal.        Behavior: Behavior is cooperative.      No results found for any visits on  10/30/22.  Assessment & Plan     1. Hypotension, unspecified hypotension type Improved on repeat but pt is still hypotensive. Appears asymptomatic Advised increasing fluids.  - EKG 12-Lead - CBC w/Diff/Platelet - Comprehensive Metabolic Panel (CMET)  2. Nausea Given hypotension, ordered EKG ; normal sinus.  Ordered labs, rx zofran. Rx omeprazole for possible GERD etiology. - EKG 12-Lead - CBC w/Diff/Platelet - Comprehensive Metabolic Panel (CMET) - Lipase - ondansetron (ZOFRAN) 4 MG tablet; Take 1 tablet (4 mg total) by mouth every 8 (eight) hours as needed for nausea or vomiting.  Dispense: 20 tablet; Refill: 0 - omeprazole (PRILOSEC) 20 MG capsule; Take 1 capsule (20 mg total) by mouth daily.  Dispense: 30 capsule; Refill: 3  3. Gastroesophageal reflux disease, unspecified whether esophagitis present - omeprazole (PRILOSEC) 20 MG capsule; Take 1 capsule (20 mg total) by mouth daily.  Dispense: 30 capsule; Refill: 3   4. Cardiac murmur Loud, but recorded previously. Stable. Pt has declined cardiology referrals  Return if symptoms worsen or fail to improve.      I, Alfredia Ferguson, PA-C have reviewed all documentation for this visit. The documentation on  10/30/22   for the exam, diagnosis, procedures, and orders are all accurate and complete.  Alfredia Ferguson, PA-C [facility] [address] [place], [place], [address] Office: [phone] Fax: [phone]   [facility]

## 2022-10-31 ENCOUNTER — Other Ambulatory Visit: Payer: Self-pay | Admitting: Physician Assistant

## 2022-10-31 DIAGNOSIS — D649 Anemia, unspecified: Secondary | ICD-10-CM

## 2022-10-31 LAB — CBC WITH DIFFERENTIAL/PLATELET
Basophils Absolute: 0 10*3/uL (ref 0.0–0.2)
Basos: 1 %
EOS (ABSOLUTE): 0.1 10*3/uL (ref 0.0–0.4)
Eos: 3 %
Hematocrit: 32.4 % — ABNORMAL LOW (ref 34.0–46.6)
Hemoglobin: 10.7 g/dL — ABNORMAL LOW (ref 11.1–15.9)
Immature Grans (Abs): 0 10*3/uL (ref 0.0–0.1)
Immature Granulocytes: 0 %
Lymphocytes Absolute: 0.6 10*3/uL — ABNORMAL LOW (ref 0.7–3.1)
Lymphs: 16 %
MCH: 29.9 pg (ref 26.6–33.0)
MCHC: 33 g/dL (ref 31.5–35.7)
MCV: 91 fL (ref 79–97)
Monocytes Absolute: 0.6 10*3/uL (ref 0.1–0.9)
Monocytes: 15 %
Neutrophils Absolute: 2.4 10*3/uL (ref 1.4–7.0)
Neutrophils: 65 %
Platelets: 241 10*3/uL (ref 150–450)
RBC: 3.58 x10E6/uL — ABNORMAL LOW (ref 3.77–5.28)
RDW: 13.4 % (ref 11.7–15.4)
WBC: 3.7 10*3/uL (ref 3.4–10.8)

## 2022-10-31 LAB — COMPREHENSIVE METABOLIC PANEL
ALT: 17 IU/L (ref 0–32)
AST: 21 IU/L (ref 0–40)
Albumin/Globulin Ratio: 1.5 (ref 1.2–2.2)
Albumin: 4.1 g/dL (ref 3.9–4.9)
Alkaline Phosphatase: 86 IU/L (ref 44–121)
BUN/Creatinine Ratio: 13 (ref 12–28)
BUN: 10 mg/dL (ref 8–27)
Bilirubin Total: 0.3 mg/dL (ref 0.0–1.2)
CO2: 24 mmol/L (ref 20–29)
Calcium: 9.8 mg/dL (ref 8.7–10.3)
Chloride: 105 mmol/L (ref 96–106)
Creatinine, Ser: 0.77 mg/dL (ref 0.57–1.00)
Globulin, Total: 2.8 g/dL (ref 1.5–4.5)
Glucose: 103 mg/dL — ABNORMAL HIGH (ref 70–99)
Potassium: 4.1 mmol/L (ref 3.5–5.2)
Sodium: 141 mmol/L (ref 134–144)
Total Protein: 6.9 g/dL (ref 6.0–8.5)
eGFR: 84 mL/min/{1.73_m2} (ref >59)

## 2022-10-31 LAB — LIPASE: Lipase: 56 U/L (ref 14–72)

## 2022-10-31 MED ORDER — IRON (FERROUS SULFATE) 325 (65 FE) MG PO TABS
325.0000 mg | ORAL_TABLET | Freq: Every day | ORAL | 1 refills | Status: DC
Start: 2022-10-31 — End: 2023-04-29

## 2022-11-06 ENCOUNTER — Other Ambulatory Visit: Payer: Self-pay | Admitting: Physician Assistant

## 2022-11-06 NOTE — Telephone Encounter (Signed)
Medication Refill - Medication: furosemide (LASIX) 20 MG tablet   Has the patient contacted their pharmacy? Yes.   Pt told to contact provider  Preferred Pharmacy (with phone number or street name):  [id number] [place] [place] - [address] Phone: [phone]  Fax: [phone]     Has the patient been seen for an appointment in the last year OR does the patient have an upcoming appointment? Yes.    Agent: Please be advised that RX refills may take up to 3 business days. We ask that you follow-up with your pharmacy.

## 2022-11-06 NOTE — Telephone Encounter (Signed)
Rx was sent to pharmacy 08/28/22 #90/1 RF.  Requested Prescriptions  Pending Prescriptions Disp Refills   furosemide (LASIX) 20 MG tablet 90 tablet 1    Sig: Take 1 tablet (20 mg total) by mouth daily as needed for edema.     Cardiovascular:  Diuretics - Loop Failed - 11/06/2022 11:29 AM      Failed - Mg Level in normal range and within 180 days    No results found for: "MG"       Passed - K in normal range and within 180 days    Potassium  Date Value Ref Range Status  10/30/2022 4.1 3.5 - 5.2 mmol/L Final  02/10/2015 3.9 mmol/L Final         Passed - Ca in normal range and within 180 days    Calcium  Date Value Ref Range Status  10/30/2022 9.8 8.7 - 10.3 mg/dL Final         Passed - Na in normal range and within 180 days    Sodium  Date Value Ref Range Status  10/30/2022 141 134 - 144 mmol/L Final         Passed - Cr in normal range and within 180 days    Creatinine, Ser  Date Value Ref Range Status  10/30/2022 0.77 0.57 - 1.00 mg/dL Final         Passed - Cl in normal range and within 180 days    Chloride  Date Value Ref Range Status  10/30/2022 105 96 - 106 mmol/L Final         Passed - Last BP in normal range    BP Readings from Last 1 Encounters:  10/30/22 107/65         Passed - Valid encounter within last 6 months    Recent Outpatient Visits           1 week ago Hypotension, unspecified hypotension type   [facility] Alfredia Ferguson, PA-C   6 months ago Encounter for annual wellness exam in Medicare patient   [facility] [place], [place], PA-C   11 months ago Candidiasis, vagina   [place] [facility] Alfredia Ferguson, PA-C   1 year ago Localized edema   [facility] Caro Laroche, DO   1 year ago Encounter for [employer] annual wellness exam   [facility] [place], Marzella Schlein, MD

## 2022-11-11 ENCOUNTER — Other Ambulatory Visit: Payer: Self-pay | Admitting: Physician Assistant

## 2022-11-11 DIAGNOSIS — M1710 Unilateral primary osteoarthritis, unspecified knee: Secondary | ICD-10-CM

## 2022-11-13 ENCOUNTER — Ambulatory Visit: Payer: Self-pay

## 2022-11-13 NOTE — Telephone Encounter (Signed)
Patient advised. Reports she can not wear the compression stockings

## 2022-11-13 NOTE — Telephone Encounter (Signed)
Patient declined referral.

## 2022-11-13 NOTE — Telephone Encounter (Signed)
Message from Valora Piccolo sent at 11/13/2022  8:10 AM EDT  Summary: Ankles swelling   Pt is calling to report that both ankles are swelling due to the heat. Would like the furosemide (LASIX) 20 MG tablet [[id number]] to be increased to 2 per day. Please advise        Called pt x 2 attempts and call was answered on first try but second call went unanswered.

## 2022-11-13 NOTE — Telephone Encounter (Signed)
  Chief Complaint: swollen ankles- better after laying at night Symptoms: swelling is worse since heat got worse- patient is taking am/pm dose of furosemide.  Frequency: several weeks Pertinent Negatives: Patient denies fever, chest pain, difficulty breathing, calf pain Disposition: [] ED /[] Urgent Care (no appt availability in office) / [] Appointment(In office/virtual)/ []  [facility]/ [] Home Care/ [x] Refused Recommended Disposition /[] [facility]/ []  Follow-up with PCP Additional Notes: Patient refused offer of appointment- patient states she was just in office- she just wants medication to help with swelling in ankles.    Reason for Disposition  Ankle swelling is a chronic symptom (recurrent or ongoing AND present > 4 weeks)  Answer Assessment - Initial Assessment Questions 1. LOCATION: "Which ankle is swollen?" "Where is the swelling?"     Bilateral ankle swelling 2. ONSET: "When did the swelling start?"     Patient states she is taking 2 fluid pills- started in heat 3. SWELLING: "How bad is the swelling?" Or, "How large is it?" (e.g., mild, moderate, severe; size of localized swelling)    - NONE: No joint swelling.   - LOCALIZED: Localized; small area of puffy or swollen skin (e.g., insect bite, skin irritation).   - MILD: Joint looks or feels mildly swollen or puffy.   - MODERATE: Swollen; interferes with normal activities (e.g., work or school); decreased range of movement; may be limping.   - SEVERE: Very swollen; can't move swollen joint at all; limping a lot or unable to walk.     mild 4. PAIN: "Is there any pain?" If Yes, ask: "How bad is it?" (Scale 1-10; or mild, moderate, severe)   - NONE (0): no pain.   - MILD (1-3): doesn't interfere with normal activities.    - MODERATE (4-7): interferes with normal activities (e.g., work or school) or awakens from sleep, limping.    - SEVERE (8-10): excruciating pain, unable to do any normal activities, unable to  walk.      no 5. CAUSE: "What do you think caused the ankle swelling?"     Fluid retention 6. OTHER SYMPTOMS: "Do you have any other symptoms?" (e.g., fever, chest pain, difficulty breathing, calf pain)     no  Protocols used: Ankle Swelling-A-AH

## 2022-12-05 ENCOUNTER — Other Ambulatory Visit: Payer: Self-pay | Admitting: Physician Assistant

## 2022-12-05 DIAGNOSIS — N898 Other specified noninflammatory disorders of vagina: Secondary | ICD-10-CM

## 2022-12-05 DIAGNOSIS — R110 Nausea: Secondary | ICD-10-CM

## 2022-12-05 DIAGNOSIS — K219 Gastro-esophageal reflux disease without esophagitis: Secondary | ICD-10-CM

## 2022-12-05 NOTE — Telephone Encounter (Signed)
Rxs were sent to pharmacy on 08/28/22 #90/1 furosemide, 10/30/22 #30/3 omeprazole.   Requested Prescriptions  Pending Prescriptions Disp Refills   triamcinolone (KENALOG) 0.025 % ointment [Pharmacy Med Name: TRIAMCINOLONE 0.025% OINT] 30 g 0    Sig: APPLY TO AFFECTED AREA TWICE A DAY     Not Delegated - Dermatology:  Corticosteroids Failed - 12/05/2022  2:15 PM      Failed - This refill cannot be delegated      Passed - Valid encounter within last 12 months    Recent Outpatient Visits           1 month ago Hypotension, unspecified hypotension type   [facility] Alfredia Ferguson, PA-C   7 months ago Encounter for annual wellness exam in Medicare patient   [facility] [place], [place], PA-C   1 year ago Candidiasis, vagina   [facility] Alfredia Ferguson, PA-C   1 year ago Localized edema   [place] Caro Laroche, DO   1 year ago Encounter for [employer] annual wellness exam   [place] [facility] [place], Marzella Schlein, MD              Refused Prescriptions Disp Refills   furosemide (LASIX) 20 MG tablet [Pharmacy Med Name: FUROSEMIDE 20 MG TABLET] 90 tablet 1    Sig: TAKE 1 TABLET (20 MG TOTAL) BY MOUTH DAILY AS NEEDED FOR EDEMA.     Cardiovascular:  Diuretics - Loop Failed - 12/05/2022  2:15 PM      Failed - Mg Level in normal range and within 180 days    No results found for: "MG"       Passed - K in normal range and within 180 days    Potassium  Date Value Ref Range Status  10/30/2022 4.1 3.5 - 5.2 mmol/L Final  02/10/2015 3.9 mmol/L Final         Passed - Ca in normal range and within 180 days    Calcium  Date Value Ref Range Status  10/30/2022 9.8 8.7 - 10.3 mg/dL Final         Passed - Na in normal range and within 180 days    Sodium  Date Value Ref Range Status  10/30/2022 141 134 - 144 mmol/L Final         Passed - Cr in normal  range and within 180 days    Creatinine, Ser  Date Value Ref Range Status  10/30/2022 0.77 0.57 - 1.00 mg/dL Final         Passed - Cl in normal range and within 180 days    Chloride  Date Value Ref Range Status  10/30/2022 105 96 - 106 mmol/L Final         Passed - Last BP in normal range    BP Readings from Last 1 Encounters:  10/30/22 107/65         Passed - Valid encounter within last 6 months    Recent Outpatient Visits           1 month ago Hypotension, unspecified hypotension type   [facility] Alfredia Ferguson, PA-C   7 months ago Encounter for annual wellness exam in Medicare patient   [facility] Alfredia Ferguson, PA-C   1 year ago Candidiasis, vagina   [facility] [place], [place], PA-C   1 year ago Localized edema  [facility] [place], Darl Householder, DO   1 year ago Encounter for [employer] annual wellness exam   [facility], Marzella Schlein, MD               omeprazole (PRILOSEC) 20 MG capsule [Pharmacy Med Name: OMEPRAZOLE DR 20 MG CAPSULE] 90 capsule 1    Sig: TAKE 1 CAPSULE BY MOUTH EVERY DAY     Gastroenterology: Proton Pump Inhibitors Passed - 12/05/2022  2:15 PM      Passed - Valid encounter within last 12 months    Recent Outpatient Visits           1 month ago Hypotension, unspecified hypotension type   [facility] Alfredia Ferguson, PA-C   7 months ago Encounter for annual wellness exam in Medicare patient   [facility], [place], PA-C   1 year ago Candidiasis, vagina   [facility] Alfredia Ferguson, PA-C   1 year ago Localized edema   [facility] Caro Laroche, DO   1 year ago Encounter for [employer] annual wellness exam   [facility], Marzella Schlein, MD

## 2022-12-05 NOTE — Telephone Encounter (Signed)
Requested medication (s) are due for refill today: yes  Requested medication (s) are on the active medication list: yes  Last refill:  10/05/22  Future visit scheduled: no  Notes to clinic:  Unable to refill per protocol, cannot delegate.      Requested Prescriptions  Pending Prescriptions Disp Refills   triamcinolone (KENALOG) 0.025 % ointment [Pharmacy Med Name: TRIAMCINOLONE 0.025% OINT] 30 g 0    Sig: APPLY TO AFFECTED AREA TWICE A DAY     Not Delegated - Dermatology:  Corticosteroids Failed - 12/05/2022  2:15 PM      Failed - This refill cannot be delegated      Passed - Valid encounter within last 12 months    Recent Outpatient Visits           1 month ago Hypotension, unspecified hypotension type   [facility] Alfredia Ferguson, PA-C   7 months ago Encounter for annual wellness exam in Medicare patient   [facility] [place], [place], PA-C   1 year ago Candidiasis, vagina   [place] Alfredia Ferguson, PA-C   1 year ago Localized edema   [place] [facility] Caro Laroche, DO   1 year ago Encounter for [employer] annual wellness exam   [facility] [place], Marzella Schlein, MD              Refused Prescriptions Disp Refills   furosemide (LASIX) 20 MG tablet [Pharmacy Med Name: FUROSEMIDE 20 MG TABLET] 90 tablet 1    Sig: TAKE 1 TABLET (20 MG TOTAL) BY MOUTH DAILY AS NEEDED FOR EDEMA.     Cardiovascular:  Diuretics - Loop Failed - 12/05/2022  2:15 PM      Failed - Mg Level in normal range and within 180 days    No results found for: "MG"       Passed - K in normal range and within 180 days    Potassium  Date Value Ref Range Status  10/30/2022 4.1 3.5 - 5.2 mmol/L Final  02/10/2015 3.9 mmol/L Final         Passed - Ca in normal range and within 180 days    Calcium  Date Value Ref Range Status  10/30/2022 9.8 8.7 - 10.3 mg/dL Final          Passed - Na in normal range and within 180 days    Sodium  Date Value Ref Range Status  10/30/2022 141 134 - 144 mmol/L Final         Passed - Cr in normal range and within 180 days    Creatinine, Ser  Date Value Ref Range Status  10/30/2022 0.77 0.57 - 1.00 mg/dL Final         Passed - Cl in normal range and within 180 days    Chloride  Date Value Ref Range Status  10/30/2022 105 96 - 106 mmol/L Final         Passed - Last BP in normal range    BP Readings from Last 1 Encounters:  10/30/22 107/65         Passed - Valid encounter within last 6 months    Recent Outpatient Visits           1 month ago Hypotension, unspecified hypotension type   [facility] Alfredia Ferguson, PA-C   7 months ago Encounter for annual wellness exam in Medicare patient  [facility] Alfredia Ferguson, PA-C   1 year ago Candidiasis, vagina   [place] Alfredia Ferguson, [place]   1 year ago Localized edema   [place] [facility] Caro Laroche, DO   1 year ago Encounter for [employer] annual wellness exam   [place] [facility] [place], Marzella Schlein, MD               omeprazole (PRILOSEC) 20 MG capsule [Pharmacy Med Name: OMEPRAZOLE DR 20 MG CAPSULE] 90 capsule 1    Sig: TAKE 1 CAPSULE BY MOUTH EVERY DAY     Gastroenterology: Proton Pump Inhibitors Passed - 12/05/2022  2:15 PM      Passed - Valid encounter within last 12 months    Recent Outpatient Visits           1 month ago Hypotension, unspecified hypotension type   [facility] Alfredia Ferguson, PA-C   7 months ago Encounter for annual wellness exam in Medicare patient   [facility] Ok Edwards, [place], PA-C   1 year ago Candidiasis, vagina   [facility] Alfredia Ferguson, PA-C   1 year ago Localized edema   [facility] Caro Laroche, DO   1 year ago Encounter for [employer] annual wellness exam   [facility], Marzella Schlein, MD

## 2022-12-10 DIAGNOSIS — L2089 Other atopic dermatitis: Secondary | ICD-10-CM | POA: Diagnosis not present

## 2022-12-10 DIAGNOSIS — Z79899 Other long term (current) drug therapy: Secondary | ICD-10-CM | POA: Diagnosis not present

## 2023-01-21 ENCOUNTER — Other Ambulatory Visit: Payer: Self-pay

## 2023-01-21 ENCOUNTER — Telehealth: Payer: Self-pay | Admitting: Family Medicine

## 2023-01-21 DIAGNOSIS — N898 Other specified noninflammatory disorders of vagina: Secondary | ICD-10-CM

## 2023-01-21 DIAGNOSIS — J301 Allergic rhinitis due to pollen: Secondary | ICD-10-CM

## 2023-01-21 DIAGNOSIS — R110 Nausea: Secondary | ICD-10-CM

## 2023-01-21 MED ORDER — FUROSEMIDE 20 MG PO TABS: 20.0000 mg | ORAL_TABLET | Freq: Every day | ORAL | 1 refills | Status: DC | PRN

## 2023-01-21 MED ORDER — ONDANSETRON HCL 4 MG PO TABS
4.0000 mg | ORAL_TABLET | Freq: Three times a day (TID) | ORAL | 0 refills | Status: DC | PRN
Start: 2023-01-21 — End: 2023-04-29

## 2023-01-21 MED ORDER — TRIAMCINOLONE ACETONIDE 0.025 % EX OINT
TOPICAL_OINTMENT | Freq: Two times a day (BID) | CUTANEOUS | 0 refills | Status: DC
Start: 2023-01-21 — End: 2023-06-10

## 2023-01-21 MED ORDER — MONTELUKAST SODIUM 10 MG PO TABS
10.0000 mg | ORAL_TABLET | Freq: Every day | ORAL | 3 refills | Status: DC
Start: 2023-01-21 — End: 2024-05-04

## 2023-01-21 NOTE — Telephone Encounter (Signed)
Patient is in need of refills for:  Furosemide Ibuprofen Montelukast Ondansetron Triamcinolone Cream   [facility] [place]

## 2023-02-01 ENCOUNTER — Other Ambulatory Visit: Payer: Self-pay | Admitting: Physician Assistant

## 2023-02-01 DIAGNOSIS — E7800 Pure hypercholesterolemia, unspecified: Secondary | ICD-10-CM

## 2023-02-23 ENCOUNTER — Other Ambulatory Visit: Payer: Self-pay | Admitting: Physician Assistant

## 2023-02-23 DIAGNOSIS — M1710 Unilateral primary osteoarthritis, unspecified knee: Secondary | ICD-10-CM

## 2023-02-25 ENCOUNTER — Other Ambulatory Visit: Payer: Self-pay | Admitting: Family Medicine

## 2023-02-25 DIAGNOSIS — M1710 Unilateral primary osteoarthritis, unspecified knee: Secondary | ICD-10-CM

## 2023-02-25 MED ORDER — IBUPROFEN 800 MG PO TABS
800.0000 mg | ORAL_TABLET | Freq: Every evening | ORAL | 0 refills | Status: DC
Start: 2023-02-25 — End: 2023-03-27

## 2023-02-25 NOTE — Telephone Encounter (Signed)
Medication Refill - Medication: ibuprofen (ADVIL) 800 MG tablet [[id number]]     Has the patient contacted their pharmacy? yes    Preferred Pharmacy?[id number] - [place], [place] - [address], [place] [address] Phone: [phone]  Fax: [phone] DEA #: [id number]      Has the patient been seen for an appointment in the last year OR does the patient have an upcoming appointment? yes

## 2023-03-05 ENCOUNTER — Ambulatory Visit (INDEPENDENT_AMBULATORY_CARE_PROVIDER_SITE_OTHER): Payer: Medicare Other

## 2023-03-05 VITALS — BP 117/70 | Ht 62.0 in | Wt 217.4 lb

## 2023-03-05 DIAGNOSIS — Z0000 Encounter for general adult medical examination without abnormal findings: Secondary | ICD-10-CM | POA: Diagnosis not present

## 2023-03-05 NOTE — Patient Instructions (Signed)
Ms. Kahley , Thank you for taking time to come for your Medicare Wellness Visit. I appreciate your ongoing commitment to your health goals. Please review the following plan we discussed and let me know if I can assist you in the future.   Referrals/Orders/Follow-Ups/Clinician Recommendations: none  This is a list of the screening recommended for you and due dates:  Health Maintenance  Topic Date Due   Zoster (Shingles) Vaccine (1 of 2) 02/11/1973   DTaP/Tdap/Td vaccine (3 - Td or Tdap) 11/10/2019   Screening for Lung Cancer  12/07/2019   Colon Cancer Screening  08/22/2022   Flu Shot  01/17/2023   COVID-19 Vaccine (4 - 2023-24 season) 02/17/2023   Mammogram  04/25/2023*   Medicare Annual Wellness Visit  03/04/2024   Pneumonia Vaccine  Completed   DEXA scan (bone density measurement)  Completed   Hepatitis C Screening  Completed   HPV Vaccine  Aged Out  *Topic was postponed. The date shown is not the original due date.    Advanced directives: (Declined) Advance directive discussed with you today. Even though you declined this today, please call our office should you change your mind, and we can give you the proper paperwork for you to fill out.  Next Medicare Annual Wellness Visit scheduled for next year: Yes 03/09/2024 @ 8:15am in person

## 2023-03-05 NOTE — Progress Notes (Addendum)
Subjective:   Cathy Townsend is a 67 y.o. female who presents for Medicare Annual (Subsequent) preventive examination.  Visit Complete: In person  Patient Medicare AWV questionnaire was completed by the patient on (not done); I have confirmed that all information answered by patient is correct and no changes since this date.  Cardiac Risk Factors include: advanced age (>55men, >87 women);dyslipidemia;obesity (BMI >30kg/m2)     Objective:    Today's Vitals   03/05/23 0817  BP: 117/70  Weight: 217 lb 6.4 oz (98.6 kg)  Height: 5\' 2"  (1.575 m)   Body mass index is 39.76 kg/m.     03/05/2023    8:28 AM 08/21/2021    9:53 AM 01/25/2020   10:32 AM 12/07/2018    4:54 PM 07/13/2016    7:50 AM 02/10/2015    2:32 PM 01/27/2015    8:42 AM  Advanced Directives  Does Patient Have a Medical Advance Directive? No No Yes No No No No  Type of Surveyor, minerals;Living will      Copy of Healthcare Power of Attorney in Chart?   No - copy requested      Would patient like information on creating a medical advance directive?    No - Patient declined  No - patient declined information;Yes - Educational materials given No - patient declined information    Current Medications (verified) Outpatient Encounter Medications as of 03/05/2023  Medication Sig   aspirin EC 81 MG tablet Take 81 mg by mouth.   atorvastatin (LIPITOR) 20 MG tablet TAKE 1 TABLET BY MOUTH EVERY DAY   benzonatate (TESSALON) 100 MG capsule Take 1 capsule (100 mg total) by mouth 2 (two) times daily as needed for cough. (Patient not taking: Reported on 03/05/2023)   cetirizine (ZYRTEC) 10 MG tablet Take 10 mg by mouth every morning.   fluticasone (FLONASE) 50 MCG/ACT nasal spray Place 2 sprays into both nostrils daily.   furosemide (LASIX) 20 MG tablet Take 1 tablet (20 mg total) by mouth daily as needed for edema.   hydrOXYzine (ATARAX) 25 MG tablet TAKE 1 TABLET BY MOUTH AT BEDTIME AS NEEDED.   ibuprofen  (ADVIL) 800 MG tablet Take 1 tablet (800 mg total) by mouth every evening.   Iron, Ferrous Sulfate, 325 (65 Fe) MG TABS Take 325 mg by mouth daily.   montelukast (SINGULAIR) 10 MG tablet Take 1 tablet (10 mg total) by mouth daily.   omeprazole (PRILOSEC) 20 MG capsule Take 1 capsule (20 mg total) by mouth daily.   ondansetron (ZOFRAN) 4 MG tablet Take 1 tablet (4 mg total) by mouth every 8 (eight) hours as needed for nausea or vomiting. (Patient not taking: Reported on 03/05/2023)   polyethylene glycol-electrolytes (NULYTELY) 420 g solution Prepare according to package instructions. Starting at 5:00 PM: Drink one 8 oz glass of mixture every 15 minutes until you finish half of the jug. Five hours prior to procedure, drink 8 oz glass of mixture every 15 minutes until it is all gone. Make sure you do not drink anything 4 hours prior to your procedure. (Patient not taking: Reported on 03/05/2023)   triamcinolone (KENALOG) 0.025 % ointment Apply topically 2 (two) times daily.   No facility-administered encounter medications on file as of 03/05/2023.    Allergies (verified) Oxycodone-acetaminophen, Tramadol, and Meloxicam   History: Past Medical History:  Diagnosis Date   Allergy    Arthritis    "think I have it in both knees" (  05/06/2012)   Hyperlipidemia    Hypertension    PONV (postoperative nausea and vomiting)    Past Surgical History:  Procedure Laterality Date   COLONOSCOPY WITH PROPOFOL N/A 07/13/2016   Procedure: COLONOSCOPY WITH PROPOFOL;  Surgeon: Wyline Mood, MD;  Location: [facility] ENDOSCOPY;  Service: Endoscopy;  Laterality: N/A;   COLONOSCOPY WITH PROPOFOL N/A 08/21/2021   Procedure: COLONOSCOPY WITH PROPOFOL;  Surgeon: Wyline Mood, MD;  Location[facility] ENDOSCOPY;  Service: Gastroenterology;  Laterality: N/A;   DILATION AND CURETTAGE OF UTERUS     ESOPHAGOGASTRODUODENOSCOPY N/A 08/21/2021   Procedure: ESOPHAGOGASTRODUODENOSCOPY (EGD);  Surgeon: Wyline Mood, MD;  Location: [facility] ENDOSCOPY;   Service: Gastroenterology;  Laterality: N/A;   JOINT REPLACEMENT     right knee   REPLACEMENT TOTAL KNEE  ~ 2008   right   TOTAL KNEE ARTHROPLASTY Left 02/10/2015   Procedure: TOTAL KNEE ARTHROPLASTY;  Surgeon: Kennedy Bucker, MD;  Location: [facility] ORS;  Service: Orthopedics;  Laterality: Left;   TOTAL KNEE REVISION  05/05/2012   Procedure: TOTAL KNEE REVISION;  Surgeon: Nestor Lewandowsky, MD;  Location: [facility] OR;  Service: Orthopedics;  Laterality: Right;   TOTAL THYROIDECTOMY  1990's   TUBAL LIGATION     Family History  Problem Relation Age of Onset   CAD Mother    Heart disease Mother    Colon cancer Father        65s   Heart attack Brother    Social History   Socioeconomic History   Marital status: Married    Spouse name: Not on file   Number of children: 2   Years of education: Not on file   Highest education level: High school graduate  Occupational History   Occupation: retired  Tobacco Use   Smoking status: Every Day    Current packs/day: 0.50    Average packs/day: 0.5 packs/day for 40.0 years (20.0 ttl pk-yrs)    Types: Cigarettes   Smokeless tobacco: Never  Vaping Use   Vaping status: Never Used  Substance and Sexual Activity   Alcohol use: Yes    Alcohol/week: 7.0 standard drinks of alcohol    Types: 7 Cans of beer per week    Comment: 1/day   Drug use: No   Sexual activity: Not Currently    Birth control/protection: Post-menopausal  Other Topics Concern   Not on file  Social History Narrative   Not on file   Social Determinants of Health   Financial Resource Strain: Low Risk  (03/05/2023)   Overall Financial Resource Strain (CARDIA)    Difficulty of Paying Living Expenses: Not hard at all  Food Insecurity: No Food Insecurity (03/05/2023)   Hunger Vital Sign    Worried About Running Out of Food in the Last Year: Never true    Ran Out of Food in the Last Year: Never true  Transportation Needs: No Transportation Needs (03/05/2023)   PRAPARE - Therapist, art (Medical): No    Lack of Transportation (Non-Medical): No  Physical Activity: Insufficiently Active (03/05/2023)   Exercise Vital Sign    Days of Exercise per Week: 4 days    Minutes of Exercise per Session: 20 min  Stress: No Stress Concern Present (03/05/2023)   [employer] of Occupational Health - Occupational Stress Questionnaire    Feeling of Stress : Not at all  Social Connections: Moderately Integrated (03/05/2023)   Social Connection and Isolation Panel [NHANES]    Frequency of Communication with Friends and Family: More  than three times a week    Frequency of Social Gatherings with Friends and Family: More than three times a week    Attends Religious Services: More than 4 times per year    Active Member of [employer] or Organizations: No    Attends Engineer, structural: Never    Marital Status: Married    Tobacco Counseling Ready to quit: Not Answered Counseling given: Not Answered   Clinical Intake:  Pre-visit preparation completed: Yes  Pain : No/denies pain     BMI - recorded: 39.76 Nutritional Status: BMI > 30  Obese Nutritional Risks: None Diabetes: No  How often do you need to have someone help you when you read instructions, pamphlets, or other written materials from your doctor or pharmacy?: 1 - Never  Interpreter Needed?: No  Comments: lives with husband Information entered by :: B.Joelyn Lover,LPN   Activities of Daily Living    03/05/2023    8:29 AM 10/30/2022    1:28 PM  In your present state of health, do you have any difficulty performing the following activities:  Hearing? 0 0  Vision? 0 0  Difficulty concentrating or making decisions? 0 0  Walking or climbing stairs? 0 0  Dressing or bathing? 0 0  Doing errands, shopping? 0 0  Preparing Food and eating ? N   Using the Toilet? N   In the past six months, have you accidently leaked urine? N   Do you have problems with loss of bowel control? N   Managing your  Medications? N   Managing your Finances? N   Housekeeping or managing your Housekeeping? N     Patient Care Team: Sherlyn Hay, DO as PCP - General (Family Medicine) Marlene Bast (Optometry) Jesusita Oka, MD (Dermatology) Beryle Flock Marzella Schlein, MD as Consulting Physician (Family Medicine)  Indicate any recent Medical Services you may have received from other than [facility] providers in the past year (date may be approximate).     Assessment:   This is a routine wellness examination for [place].  Hearing/Vision screen Hearing Screening - Comments:: Pt says hearing is good Vision Screening - Comments:: Pt says vision is good with her glasses   Goals Addressed             This Visit's Progress    COMPLETED: Weight (lb) < 200 lb (90.7 kg)   217 lb 6.4 oz (98.6 kg)    Recommend to cut back on sugar in daily diet to help with weight loss.        Depression Screen    03/05/2023    8:26 AM 10/30/2022    1:28 PM 04/24/2022    8:54 AM 11/14/2021    9:46 AM 05/29/2021    1:58 PM 03/28/2021    9:07 AM 09/05/2020   10:53 AM  PHQ 2/9 Scores  PHQ - 2 Score 0 0 0 0 0 0 0  PHQ- 9 Score  0 0 0 0 0 0    Fall Risk    03/05/2023    8:19 AM 10/30/2022    1:28 PM 04/24/2022    8:54 AM 11/14/2021    9:46 AM 05/29/2021    1:57 PM  Fall Risk   Falls in the past year? 0 0 0 0 0  Number falls in past yr: 0 0 0 0 0  Injury with Fall? 0 0 0 0 0  Risk for fall due to : No Fall Risks No Fall Risks No Fall Risks  No Fall Risks No Fall Risks  Follow up Falls prevention discussed;Education provided Falls evaluation completed Falls evaluation completed  Falls evaluation completed    MEDICARE RISK AT HOME: Medicare Risk at Home Any stairs in or around the home?: Yes If so, are there any without handrails?: Yes Home free of loose throw rugs in walkways, pet beds, electrical cords, etc?: Yes Adequate lighting in your home to reduce risk of falls?: Yes Life alert?: No Use of a cane,  walker or w/c?: No Grab bars in the bathroom?: No Shower chair or bench in shower?: No Elevated toilet seat or a handicapped toilet?: No  TIMED UP AND GO:  Was the test performed?  Yes  Length of time to ambulate 10 feet: 10 sec Gait steady and fast without use of assistive device    Cognitive Function:        03/05/2023    8:40 AM 03/28/2021    9:08 AM 01/25/2020   10:50 AM  6CIT Screen  What Year? 0 points 0 points 0 points  What month? 0 points 0 points 0 points  What time? 0 points 0 points 0 points  Count back from 20 0 points 2 points 0 points  Months in reverse 0 points 4 points 4 points  Repeat phrase 0 points 0 points 4 points  Total Score 0 points 6 points 8 points    Immunizations Immunization History  Administered Date(s) Administered   Fluad Quad(high Dose 65+) 03/31/2019, 04/24/2022   Influenza,inj,Quad PF,6+ Mos 05/20/2017, 05/26/2018   Influenza-Unspecified 03/14/2021   Moderna Sars-Covid-2 Vaccination 08/01/2019, 08/29/2019, 04/11/2020   PNEUMOCOCCAL CONJUGATE-20 04/24/2022   Pneumococcal Conjugate-13 01/25/2020   Pneumococcal Polysaccharide-23 04/06/2014   Td 05/28/2002   Tdap 11/09/2009   Zoster, Live 12/31/2013    TDAP status: Up to date  Flu Vaccine status: Up to date Pt has already had  Pneumococcal vaccine status: Up to date  Covid-19 vaccine status: Completed vaccines  [facility]ifies [facility] Shingles [facility]? [facility] Yes   Shingrix Completed?: Yes  Screening Tests Health Maintenance  Topic Date Due   Zoster Vaccines- Shingrix (1 of 2) 02/11/1973   DTaP/Tdap/Td (3 - Td or Tdap) 11/10/2019   Lung Cancer Screening  12/07/2019   Colonoscopy  08/22/2022   INFLUENZA VACCINE  01/17/2023   COVID-19 Vaccine (4 - 2023-24 season) 02/17/2023   MAMMOGRAM  04/25/2023 (Originally 09/06/2018)   Medicare Annual Wellness (AWV)  03/04/2024   Pneumonia Vaccine 37+ Years old  Completed   DEXA SCAN  Completed   Hepatitis C Screening   Completed   HPV VACCINES  Aged Out    Health Maintenance  Health Maintenance Due  Topic Date Due   Zoster Vaccines- Shingrix (1 of 2) 02/11/1973   DTaP/Tdap/Td (3 - Td or Tdap) 11/10/2019   Lung Cancer Screening  12/07/2019   Colonoscopy  08/22/2022   INFLUENZA VACCINE  01/17/2023   COVID-19 Vaccine (4 - 2023-24 season) 02/17/2023    Colorectal cancer screening: Type of screening: Colonoscopy. Completed yes. Repeat every 5-10 years pt declines :does not want anymore  Mammogram status: No longer required due to PT DECLINES.  Bone Density status: Completed YES. Results reflect: Bone density results: NORMAL. Repeat every 5 years.  Lung Cancer Screening: (Low Dose CT Chest recommended if Age 67-80 years, 20 pack-year currently smoking OR have quit w/in 15years.) does not qualify.   Lung Cancer Screening Referral: NO PT DECLINES  Additional Screening:  Hepatitis C Screening: does not qualify;  Completed Yes  Vision Screening: Recommended annual ophthalmology exams for early detection of glaucoma and other disorders of the eye. Is the patient up to date with their annual eye exam?  Yes  Who is the provider or what is the name of the office in which the patient attends annual eye exams? [facility] If pt is not established with a provider, would they like to be referred to a provider to establish care? No .   Dental Screening: Recommended annual dental exams for proper oral hygiene  Diabetic Foot Exam:   Community Resource Referral / Chronic Care Management: CRR required this visit?  No   CCM required this visit?  No    Plan:     I have personally reviewed and noted the following in the patient's chart:   Medical and social history Use of alcohol, tobacco or illicit drugs  Current medications and supplements including opioid prescriptions. Patient is not currently taking opioid prescriptions. Functional ability and status Nutritional status Physical activity Advanced  directives List of other physicians Hospitalizations, surgeries, and ER visits in previous 12 months Vitals Screenings to include cognitive, depression, and falls Referrals and appointments  In addition, I have reviewed and discussed with patient certain preventive protocols, quality metrics, and best practice recommendations. A written personalized care plan for preventive services as well as general preventive health recommendations were provided to patient.     Sue Lush, LPN   [id number]   After Visit Summary: PRINTED AND GIVEN TO PT  Nurse Notes: The patient states she is doing well and has no concerns or questions at this time.

## 2023-03-24 ENCOUNTER — Other Ambulatory Visit: Payer: Self-pay | Admitting: Family Medicine

## 2023-03-24 DIAGNOSIS — M1710 Unilateral primary osteoarthritis, unspecified knee: Secondary | ICD-10-CM

## 2023-03-25 NOTE — Telephone Encounter (Signed)
Requested medication (s) are due for refill today - yes  Requested medication (s) are on the active medication list -yes  Future visit scheduled -no  Last refill: 02/25/23 #30  Notes to clinic: abnormal labs- review request  Requested Prescriptions  Pending Prescriptions Disp Refills   ibuprofen (ADVIL) 800 MG tablet [Pharmacy Med Name: IBUPROFEN 800 MG TABLET] 30 tablet 0    Sig: TAKE 1 TABLET (800 MG TOTAL) BY MOUTH EVERY EVENING. MAKE APPT FOR NEXT REFILL     Analgesics:  NSAIDS Failed - 03/24/2023  9:55 AM      Failed - Manual Review: Labs are only required if the patient has taken medication for more than 8 weeks.      Failed - HGB in normal range and within 360 days    Hemoglobin  Date Value Ref Range Status  10/30/2022 10.7 (L) 11.1 - 15.9 g/dL Final         Failed - HCT in normal range and within 360 days    Hematocrit  Date Value Ref Range Status  10/30/2022 32.4 (L) 34.0 - 46.6 % Final         Passed - Cr in normal range and within 360 days    Creatinine, Ser  Date Value Ref Range Status  10/30/2022 0.77 0.57 - 1.00 mg/dL Final         Passed - PLT in normal range and within 360 days    Platelets  Date Value Ref Range Status  10/30/2022 241 150 - 450 x10E3/uL Final         Passed - eGFR is 30 or above and within 360 days    GFR calc Af Amer  Date Value Ref Range Status  03/21/2020 101 >59 mL/min/1.73 Final    Comment:    **[facility] currently reports eGFR in compliance with the current**   recommendations of the [employer]. [facility] will   update reporting as new guidelines are published from the NKF-ASN   Task force.    GFR calc non Af Amer  Date Value Ref Range Status  03/21/2020 88 >59 mL/min/1.73 Final   eGFR  Date Value Ref Range Status  10/30/2022 84 >59 mL/min/1.73 Final         Passed - Patient is not pregnant      Passed - Valid encounter within last 12 months    Recent Outpatient Visits           4 months ago  Hypotension, unspecified hypotension type   [facility] Alfredia Ferguson, PA-C   11 months ago Encounter for annual wellness exam in Medicare patient   [facility] Ok Edwards, [place], PA-C   1 year ago Candidiasis, vagina   [facility] Alfredia Ferguson, PA-C   1 year ago Localized edema   [place] [facility] Caro Laroche, DO   1 year ago Encounter for [employer] annual wellness exam   [facility] Beryle Flock, Marzella Schlein, MD                 Requested Prescriptions  Pending Prescriptions Disp Refills   ibuprofen (ADVIL) 800 MG tablet [Pharmacy Med Name: IBUPROFEN 800 MG TABLET] 30 tablet 0    Sig: TAKE 1 TABLET (800 MG TOTAL) BY MOUTH EVERY EVENING. MAKE APPT FOR NEXT REFILL     Analgesics:  NSAIDS Failed - 03/24/2023  9:55 AM      Failed - Manual  Review: Labs are only required if the patient has taken medication for more than 8 weeks.      Failed - HGB in normal range and within 360 days    Hemoglobin  Date Value Ref Range Status  10/30/2022 10.7 (L) 11.1 - 15.9 g/dL Final         Failed - HCT in normal range and within 360 days    Hematocrit  Date Value Ref Range Status  10/30/2022 32.4 (L) 34.0 - 46.6 % Final         Passed - Cr in normal range and within 360 days    Creatinine, Ser  Date Value Ref Range Status  10/30/2022 0.77 0.57 - 1.00 mg/dL Final         Passed - PLT in normal range and within 360 days    Platelets  Date Value Ref Range Status  10/30/2022 241 150 - 450 x10E3/uL Final         Passed - eGFR is 30 or above and within 360 days    GFR calc Af Amer  Date Value Ref Range Status  03/21/2020 101 >59 mL/min/1.73 Final    Comment:    **[facility] currently reports eGFR in compliance with the current**   recommendations of the [employer]. [facility] will   update reporting as new guidelines are published from the  NKF-ASN   Task force.    GFR calc non Af Amer  Date Value Ref Range Status  03/21/2020 88 >59 mL/min/1.73 Final   eGFR  Date Value Ref Range Status  10/30/2022 84 >59 mL/min/1.73 Final         Passed - Patient is not pregnant      Passed - Valid encounter within last 12 months    Recent Outpatient Visits           4 months ago Hypotension, unspecified hypotension type   [facility] Alfredia Ferguson, PA-C   11 months ago Encounter for annual wellness exam in Medicare patient   [facility] Ok Edwards, [place], PA-C   1 year ago Candidiasis, vagina   [place] [facility] Alfredia Ferguson, PA-C   1 year ago Localized edema   [facility] Caro Laroche, DO   1 year ago Encounter for [employer] annual wellness exam   [facility], Marzella Schlein, MD

## 2023-04-27 ENCOUNTER — Other Ambulatory Visit: Payer: Self-pay | Admitting: Family Medicine

## 2023-04-27 DIAGNOSIS — M1710 Unilateral primary osteoarthritis, unspecified knee: Secondary | ICD-10-CM

## 2023-04-29 ENCOUNTER — Other Ambulatory Visit (HOSPITAL_COMMUNITY)
Admission: RE | Admit: 2023-04-29 | Discharge: 2023-04-29 | Disposition: A | Discharge status: Home / Self Care | Payer: Medicare Other | Source: Ambulatory Visit | Attending: Family Medicine | Admitting: Family Medicine

## 2023-04-29 ENCOUNTER — Ambulatory Visit (INDEPENDENT_AMBULATORY_CARE_PROVIDER_SITE_OTHER): Payer: Medicare Other | Admitting: Family Medicine

## 2023-04-29 VITALS — BP 109/54 | HR 68 | Temp 98.4°F | Resp 16 | Ht 62.0 in | Wt 212.0 lb

## 2023-04-29 DIAGNOSIS — Z5320 Procedure and treatment not carried out because of patient's decision for unspecified reasons: Secondary | ICD-10-CM

## 2023-04-29 DIAGNOSIS — J301 Allergic rhinitis due to pollen: Secondary | ICD-10-CM | POA: Diagnosis not present

## 2023-04-29 DIAGNOSIS — D649 Anemia, unspecified: Secondary | ICD-10-CM

## 2023-04-29 DIAGNOSIS — K219 Gastro-esophageal reflux disease without esophagitis: Secondary | ICD-10-CM

## 2023-04-29 DIAGNOSIS — L209 Atopic dermatitis, unspecified: Secondary | ICD-10-CM | POA: Diagnosis not present

## 2023-04-29 DIAGNOSIS — R600 Localized edema: Secondary | ICD-10-CM

## 2023-04-29 DIAGNOSIS — Z79899 Other long term (current) drug therapy: Secondary | ICD-10-CM | POA: Diagnosis not present

## 2023-04-29 DIAGNOSIS — N898 Other specified noninflammatory disorders of vagina: Secondary | ICD-10-CM | POA: Insufficient documentation

## 2023-04-29 DIAGNOSIS — E7800 Pure hypercholesterolemia, unspecified: Secondary | ICD-10-CM | POA: Diagnosis not present

## 2023-04-29 DIAGNOSIS — Z0001 Encounter for general adult medical examination with abnormal findings: Secondary | ICD-10-CM

## 2023-04-29 DIAGNOSIS — E559 Vitamin D deficiency, unspecified: Secondary | ICD-10-CM

## 2023-04-29 DIAGNOSIS — R110 Nausea: Secondary | ICD-10-CM | POA: Diagnosis not present

## 2023-04-29 DIAGNOSIS — F17200 Nicotine dependence, unspecified, uncomplicated: Secondary | ICD-10-CM

## 2023-04-29 DIAGNOSIS — T84012S Broken internal right knee prosthesis, sequela: Secondary | ICD-10-CM

## 2023-04-29 DIAGNOSIS — Z0000 Encounter for general adult medical examination without abnormal findings: Secondary | ICD-10-CM

## 2023-04-29 MED ORDER — MULTIVITAMIN WOMEN 50+ PO TABS: 1.0000 | ORAL_TABLET | Freq: Every day | ORAL | 3 refills | Status: DC

## 2023-04-29 MED ORDER — ATORVASTATIN CALCIUM 20 MG PO TABS: 20.0000 mg | ORAL_TABLET | Freq: Every day | ORAL | 0 refills | Status: DC

## 2023-04-29 MED ORDER — IRON (FERROUS SULFATE) 325 (65 FE) MG PO TABS: 325.0000 mg | ORAL_TABLET | Freq: Every day | ORAL | 1 refills | Status: DC

## 2023-04-29 MED ORDER — FUROSEMIDE 20 MG PO TABS: 20.0000 mg | ORAL_TABLET | Freq: Every day | ORAL | 1 refills | Status: DC | PRN

## 2023-04-29 MED ORDER — CLOBETASOL PROPIONATE 0.05 % EX CREA: 1.0000 | TOPICAL_CREAM | Freq: Two times a day (BID) | CUTANEOUS | 0 refills | Status: DC

## 2023-04-29 MED ORDER — CETIRIZINE HCL 10 MG PO TABS: 10.0000 mg | ORAL_TABLET | Freq: Every morning | ORAL | 3 refills | Status: DC

## 2023-04-29 MED ORDER — OMEPRAZOLE 20 MG PO CPDR: 20.0000 mg | DELAYED_RELEASE_CAPSULE | Freq: Every day | ORAL | 1 refills | Status: DC

## 2023-04-29 NOTE — Progress Notes (Signed)
Complete physical exam   [name]   DOB: Oct 21, 1955   67 y.o. Female  MRN: [id number] Visit Date: 04/29/2023  Today's healthcare provider: Sherlyn Hay, DO   Chief Complaint  Patient presents with   Annual Exam   Subjective    HPI HPI   Patient complains of vaginal irritation.  She states that she was seen by Gyn and they gave her some cream to use.  She states that sometimes it helps but this time it is not.   Last edited by Adline Peals, CMA on 04/29/2023  8:17 AM.     Cathy Townsend is a 67 y.o. female who presents today for a complete physical exam.  She also has complaint of vaginal irritation. She reports that the irritation is located at the entrance of the vagina, describing it as an itching sensation. The patient has been using a cream, possibly starting with an 'H', prescribed by an OB-GYN for this issue, but is unsure of the exact name. She has also used triamcinolone ointment for the same issue in the past. The patient denies any vaginal discharge.  The patient reports taking iron tablets, omeprazole, Lipitor, and Lasix 20mg  as needed for fluid retention. She has been taking ibuprofen 800mg  every other day for knee pain, but is open to reducing the dosage to 600mg . She also takes Zyrtec for allergies. The patient has a history of smoking half a pack a day for forty years, but declines lung screening exams and has no interest in quitting smoking.  The patient maintains a general diet and walks three days a week around a track for exercise. She also performs body weight exercises at home.  She reports feeling well overall she reports good sleep and overall health. She has received all her vaccinations, including shingles, at a local pharmacy this year. She has declined referrals for a heart murmur in the past.  The patient has a history of low vitamin D levels and has requested a multivitamin for additional energy. She has declined mammograms and is  not currently sexually active. She has never had a history of abnormal Pap smears.   Past Medical History:  Diagnosis Date   Allergy    Arthritis    "think I have it in both knees" (05/06/2012)   Hyperlipidemia    Hypertension    PONV (postoperative nausea and vomiting)    Past Surgical History:  Procedure Laterality Date   COLONOSCOPY WITH PROPOFOL N/A 07/13/2016   Procedure: COLONOSCOPY WITH PROPOFOL;  Surgeon: Wyline Mood, MD;  Location: [facility] ENDOSCOPY;  Service: Endoscopy;  Laterality: N/A;   COLONOSCOPY WITH PROPOFOL N/A 08/21/2021   Procedure: COLONOSCOPY WITH PROPOFOL;  Surgeon: Wyline Mood, MD;  Location: [facility] ENDOSCOPY;  Service: Gastroenterology;  Laterality: N/A;   DILATION AND CURETTAGE OF UTERUS     ESOPHAGOGASTRODUODENOSCOPY N/A 08/21/2021   Procedure: ESOPHAGOGASTRODUODENOSCOPY (EGD);  Surgeon: Wyline Mood, MD;  Location: [facility] ENDOSCOPY;  Service: Gastroenterology;  Laterality: N/A;   JOINT REPLACEMENT     right knee   REPLACEMENT TOTAL KNEE  ~ 2008   right   TOTAL KNEE ARTHROPLASTY Left 02/10/2015   Procedure: TOTAL KNEE ARTHROPLASTY;  Surgeon: Kennedy Bucker, MD;  Location: [facility] ORS;  Service: Orthopedics;  Laterality: Left;   TOTAL KNEE REVISION  05/05/2012   Procedure: TOTAL KNEE REVISION;  Surgeon: Nestor Lewandowsky, MD;  Location: [facility] OR;  Service: Orthopedics;  Laterality: Right;   TOTAL THYROIDECTOMY  1990's  TUBAL LIGATION     Social History   Socioeconomic History   Marital status: Married    Spouse name: Not on file   Number of children: 2   Years of education: Not on file   Highest education level: High school graduate  Occupational History   Occupation: retired  Tobacco Use   Smoking status: Every Day    Current packs/day: 0.50    Average packs/day: 0.5 packs/day for 40.0 years (20.0 ttl pk-yrs)    Types: Cigarettes   Smokeless tobacco: Never  Vaping Use   Vaping status: Never Used  Substance and Sexual Activity   Alcohol use: Yes    Alcohol/week:  7.0 standard drinks of alcohol    Types: 7 Cans of beer per week    Comment: 1/day   Drug use: No   Sexual activity: Not Currently    Birth control/protection: Post-menopausal  Other Topics Concern   Not on file  Social History Narrative   Not on file   Social Determinants of Health   Financial Resource Strain: Low Risk  (03/05/2023)   Overall Financial Resource Strain (CARDIA)    Difficulty of Paying Living Expenses: Not hard at all  Food Insecurity: No Food Insecurity (03/05/2023)   Hunger Vital Sign    Worried About Running Out of Food in the Last Year: Never true    Ran Out of Food in the Last Year: Never true  Transportation Needs: No Transportation Needs (03/05/2023)   PRAPARE - Administrator, Civil Service (Medical): No    Lack of Transportation (Non-Medical): No  Physical Activity: Insufficiently Active (03/05/2023)   Exercise Vital Sign    Days of Exercise per Week: 4 days    Minutes of Exercise per Session: 20 min  Stress: No Stress Concern Present (03/05/2023)   [employer] of Occupational Health - Occupational Stress Questionnaire    Feeling of Stress : Not at all  Social Connections: Moderately Integrated (03/05/2023)   Social Connection and Isolation Panel [NHANES]    Frequency of Communication with Friends and Family: More than three times a week    Frequency of Social Gatherings with Friends and Family: More than three times a week    Attends Religious Services: More than 4 times per year    Active Member of [employer] or Organizations: No    Attends Banker Meetings: Never    Marital Status: Married  Catering manager Violence: Not At Risk (03/05/2023)   Humiliation, Afraid, Rape, and Kick questionnaire    Fear of Current or Ex-Partner: No    Emotionally Abused: No    Physically Abused: No    Sexually Abused: No   Family Status  Relation Name Status   Mother  Alive   Father  Deceased at age 57   Sister  Deceased at age 4   Sister   Alive   Sister  Alive   Sister  Alive   Brother  Deceased at age 35   Brother  Deceased at age 29       gunshot wound   Daughter  Alive   Son  Alive  No partnership data on file   Family History  Problem Relation Age of Onset   CAD Mother    Heart disease Mother    Colon cancer Father        85s   Heart attack Brother    Allergies  Allergen Reactions   Oxycodone-Acetaminophen Itching   Tramadol Itching  Meloxicam Rash    Patient Care Team: Sherlyn Hay, DO as PCP - General (Family Medicine) Marlene Bast (Optometry) Jesusita Oka, MD (Dermatology) Beryle Flock Marzella Schlein, MD as Consulting Physician (Family Medicine)   Medications: Outpatient Medications Prior to Visit  Medication Sig   aspirin EC 81 MG tablet Take 81 mg by mouth.   ibuprofen (ADVIL) 800 MG tablet TAKE 1 TABLET (800 MG TOTAL) BY MOUTH EVERY EVENING. MAKE APPT FOR NEXT REFILL   montelukast (SINGULAIR) 10 MG tablet Take 1 tablet (10 mg total) by mouth daily.   triamcinolone (KENALOG) 0.025 % ointment Apply topically 2 (two) times daily.   [DISCONTINUED] atorvastatin (LIPITOR) 20 MG tablet TAKE 1 TABLET BY MOUTH EVERY DAY   [DISCONTINUED] cetirizine (ZYRTEC) 10 MG tablet Take 10 mg by mouth every morning.   [DISCONTINUED] fluticasone (FLONASE) 50 MCG/ACT nasal spray Place 2 sprays into both nostrils daily.   [DISCONTINUED] furosemide (LASIX) 20 MG tablet Take 1 tablet (20 mg total) by mouth daily as needed for edema.   [DISCONTINUED] hydrOXYzine (ATARAX) 25 MG tablet TAKE 1 TABLET BY MOUTH AT BEDTIME AS NEEDED.   [DISCONTINUED] Iron, Ferrous Sulfate, 325 (65 Fe) MG TABS Take 325 mg by mouth daily.   [DISCONTINUED] omeprazole (PRILOSEC) 20 MG capsule Take 1 capsule (20 mg total) by mouth daily.   [DISCONTINUED] benzonatate (TESSALON) 100 MG capsule Take 1 capsule (100 mg total) by mouth 2 (two) times daily as needed for cough. (Patient not taking: Reported on 03/05/2023)   [DISCONTINUED] ondansetron  (ZOFRAN) 4 MG tablet Take 1 tablet (4 mg total) by mouth every 8 (eight) hours as needed for nausea or vomiting. (Patient not taking: Reported on 03/05/2023)   [DISCONTINUED] polyethylene glycol-electrolytes (NULYTELY) 420 g solution Prepare according to package instructions. Starting at 5:00 PM: Drink one 8 oz glass of mixture every 15 minutes until you finish half of the jug. Five hours prior to procedure, drink 8 oz glass of mixture every 15 minutes until it is all gone. Make sure you do not drink anything 4 hours prior to your procedure. (Patient not taking: Reported on 03/05/2023)   No facility-administered medications prior to visit.    Review of Systems  Constitutional:  Negative for appetite change, chills, fatigue and fever.  Respiratory:  Negative for chest tightness and shortness of breath.   Cardiovascular:  Negative for chest pain and palpitations.  Gastrointestinal:  Negative for abdominal pain, nausea and vomiting.  Genitourinary:  Negative for vaginal discharge.  Musculoskeletal:  Positive for arthralgias.  Skin:        +itching at her vulva  Neurological:  Negative for dizziness and weakness.      Objective    BP (!) 109/54 (BP Location: Left Arm, Patient Position: Sitting, Cuff Size: Large)   Pulse 68   Temp 98.4 F (36.9 C) (Oral)   Resp 16   Ht 5\' 2"  (1.575 m)   Wt 212 lb (96.2 kg)   BMI 38.78 kg/m    Physical Exam Vitals and nursing note reviewed.  Constitutional:      General: She is awake.     Appearance: Normal appearance.  HENT:     Head: Normocephalic and atraumatic.     Right Ear: Tympanic membrane, ear canal and external ear normal.     Left Ear: Tympanic membrane, ear canal and external ear normal.     Nose: Nose normal.     Mouth/Throat:     Mouth: Mucous membranes are moist.  Pharynx: Oropharynx is clear. No oropharyngeal exudate or posterior oropharyngeal erythema.  Eyes:     General: No scleral icterus.    Extraocular Movements:  Extraocular movements intact.     Conjunctiva/sclera: Conjunctivae normal.     Pupils: Pupils are equal, round, and reactive to light.  Neck:     Thyroid: No thyromegaly or thyroid tenderness.  Cardiovascular:     Rate and Rhythm: Normal rate and regular rhythm.     Pulses: Normal pulses.     Heart sounds: Normal heart sounds.  Pulmonary:     Effort: Pulmonary effort is normal. No tachypnea, bradypnea or respiratory distress.     Breath sounds: Normal breath sounds. No stridor. No wheezing, rhonchi or rales.  Abdominal:     General: Bowel sounds are normal. There is no distension.     Palpations: Abdomen is soft. There is no mass.     Tenderness: There is no abdominal tenderness. There is no guarding.     Hernia: No hernia is present.  Genitourinary:    Comments: White discoloration to vulva; no obvious discharge;  Musculoskeletal:     Cervical back: Normal range of motion and neck supple.     Right lower leg: No edema.     Left lower leg: No edema.  Lymphadenopathy:     Cervical: No cervical adenopathy.  Skin:    General: Skin is warm and dry.  Neurological:     Mental Status: She is alert and oriented to person, place, and time. Mental status is at baseline.  Psychiatric:        Mood and Affect: Mood normal.        Behavior: Behavior normal.      Last depression screening scores    03/05/2023    8:26 AM 10/30/2022    1:28 PM 04/24/2022    8:54 AM  PHQ 2/9 Scores  PHQ - 2 Score 0 0 0  PHQ- 9 Score  0 0   Last fall risk screening    03/05/2023    8:19 AM  Fall Risk   Falls in the past year? 0  Number falls in past yr: 0  Injury with Fall? 0  Risk for fall due to : No Fall Risks  Follow up Falls prevention discussed;Education provided   Last Audit-C alcohol use screening    03/05/2023    8:25 AM  Alcohol Use Disorder Test (AUDIT)  1. How often do you have a drink containing alcohol? 2  2. How many drinks containing alcohol do you have on a typical day when you  are drinking? 0  3. How often do you have six or more drinks on one occasion? 0  AUDIT-C Score 2   A score of 3 or more in women, and 4 or more in men indicates increased risk for alcohol abuse, EXCEPT if all of the points are from question 1   Results for orders placed or performed in visit on 04/29/23  Comprehensive metabolic panel  Result Value Ref Range   Glucose 90 70 - 99 mg/dL   BUN 14 8 - 27 mg/dL   Creatinine, Ser 1.61 0.57 - 1.00 mg/dL   eGFR 89 >09 UE/AVW/0.98   BUN/Creatinine Ratio 19 12 - 28   Sodium 143 134 - 144 mmol/L   Potassium 4.6 3.5 - 5.2 mmol/L   Chloride 104 96 - 106 mmol/L   CO2 25 20 - 29 mmol/L   Calcium 9.5 8.7 - 10.3 mg/dL  Total Protein 6.9 6.0 - 8.5 g/dL   Albumin 4.1 3.9 - 4.9 g/dL   Globulin, Total 2.8 1.5 - 4.5 g/dL   Bilirubin Total 0.3 0.0 - 1.2 mg/dL   Alkaline Phosphatase 92 44 - 121 IU/L   AST 20 0 - 40 IU/L   ALT 14 0 - 32 IU/L  Lipid panel  Result Value Ref Range   Cholesterol, Total 174 100 - 199 mg/dL   Triglycerides 77 0 - 149 mg/dL   HDL 61 >16 mg/dL   VLDL Cholesterol Cal 14 5 - 40 mg/dL   LDL Chol Calc (NIH) 99 0 - 99 mg/dL   Chol/HDL Ratio 2.9 0.0 - 4.4 ratio  VITAMIN D 25 Hydroxy (Vit-D Deficiency, Fractures)  Result Value Ref Range   Vit D, 25-Hydroxy 27.6 (L) 30.0 - 100.0 ng/mL  CBC  Result Value Ref Range   WBC 3.5 3.4 - 10.8 x10E3/uL   RBC 3.80 3.77 - 5.28 x10E6/uL   Hemoglobin 11.4 11.1 - 15.9 g/dL   Hematocrit 10.9 60.4 - 46.6 %   MCV 95 79 - 97 fL   MCH 30.0 26.6 - 33.0 pg   MCHC 31.8 31.5 - 35.7 g/dL   RDW 54.0 98.1 - 19.1 %   Platelets 291 150 - 450 x10E3/uL  Vitamin B12  Result Value Ref Range   Vitamin B-12 558 232 - 1,245 pg/mL  Cervicovaginal ancillary only  Result Value Ref Range   Bacterial Vaginitis (gardnerella) Negative    Candida Vaginitis Negative    Candida Glabrata Negative    Comment Normal Reference Range Candida Species - Negative    Comment Normal Reference Range Candida Galbrata -  Negative    Comment      Normal Reference Range Bacterial Vaginosis - Negative    Assessment & Plan    Routine Health Maintenance and Physical Exam  Exercise Activities and Dietary recommendations  Goals   None     Immunization History  Administered Date(s) Administered   Fluad Quad(high Dose 65+) 03/31/2019, 04/24/2022, 02/19/2023   Influenza,inj,Quad PF,6+ Mos 05/20/2017, 05/26/2018   Influenza-Unspecified 03/14/2021   Moderna Sars-Covid-2 Vaccination 08/01/2019, 08/29/2019, 04/11/2020   PNEUMOCOCCAL CONJUGATE-20 04/24/2022   Pfizer Covid-19 Vaccine Bivalent Booster 52yrs & up 02/19/2023   Pneumococcal Conjugate-13 01/25/2020   Pneumococcal Polysaccharide-23 04/06/2014   Td 05/28/2002   Tdap 11/09/2009   Zoster, Live 12/31/2013    Health Maintenance  Topic Date Due   DTaP/Tdap/Td (3 - Td or Tdap) 11/10/2019   Zoster Vaccines- Shingrix (1 of 2) 06/04/2023 (Originally 02/11/1975)   COVID-19 Vaccine (5 - 2023-24 season) 09/16/2023 (Originally 04/16/2023)   Lung Cancer Screening  03/04/2024 (Originally 12/07/2019)   Colonoscopy  03/04/2024 (Originally 08/22/2022)   MAMMOGRAM  04/28/2024 (Originally 09/06/2018)   Medicare Annual Wellness (AWV)  03/04/2024   Pneumonia Vaccine 7+ Years old  Completed   INFLUENZA VACCINE  Completed   DEXA SCAN  Completed   Hepatitis C Screening  Completed   HPV VACCINES  Aged Out    Discussed health benefits of physical activity, and encouraged her to engage in regular exercise appropriate for her age and condition.   Annual physical exam Assessment & Plan: Patient is up to date on vaccinations, including shingles and COVID-19.  Patient is past the age for recommended Pap smears. -Check Vitamin D level due to previous low levels and vitamin B12 level due to long-term treatment with PPI. -Prescribe multivitamin as requested by patient. -Recommend tetanus vaccine (Tdap) at pharmacy. -Schedule annual follow-up  appointment.  Orders: -      Multivitamin Women 50+; Take 1 tablet by mouth daily.  Dispense: 90 tablet; Refill: 3  High risk medication use -     Vitamin B12  Vaginal itching Assessment & Plan: Patient reports external vaginal irritation. No discharge reported. Previous treatment with triamcinolone ointment provided relief. Patient also reports discomfort during pelvic exam, suggesting possible atrophic vaginitis. -Perform yeast infection test. -Represcribe triamcinolone ointment for external irritation. -Consider oral medication if positive for infection.  Orders: -     Cervicovaginal ancillary only  Screening mammography declined  Atopic dermatitis, unspecified type -     Clobetasol Propionate; Apply 1 Application topically 2 (two) times daily.  Dispense: 30 g; Refill: 0  Failed total right knee replacement, sequela Assessment & Plan: Patient reports chronic knee pain, managed with ibuprofen 800mg  as needed. -Reduce ibuprofen dosage to 600mg  as needed to minimize kidney impact.   Nicotine dependence with current use Assessment & Plan: Patient is a current smoker with a 40-year half-pack per day history. Patient declines lung cancer screening and has no interest in smoking cessation.    Lung cancer screening declined by patient  Anemia, unspecified type -     CBC -     Iron (Ferrous Sulfate); Take 325 mg by mouth daily.  Dispense: 90 tablet; Refill: 1  Gastroesophageal reflux disease, unspecified whether esophagitis present -     Omeprazole; Take 1 capsule (20 mg total) by mouth daily.  Dispense: 90 capsule; Refill: 1  Hypercholesterolemia -     Comprehensive metabolic panel -     Lipid panel -     Atorvastatin Calcium; Take 1 tablet (20 mg total) by mouth daily.  Dispense: 90 tablet; Refill: 0  Seasonal allergic rhinitis due to pollen -     Cetirizine HCl; Take 1 tablet (10 mg total) by mouth every morning.  Dispense: 90 tablet; Refill: 3  Vitamin D deficiency -     VITAMIN D 25 Hydroxy (Vit-D  Deficiency, Fractures)  Peripheral edema -     Furosemide; Take 1 tablet (20 mg total) by mouth daily as needed for edema.  Dispense: 90 tablet; Refill: 1  Nausea -     Omeprazole; Take 1 capsule (20 mg total) by mouth daily.  Dispense: 90 capsule; Refill: 1   Return in about 1 year (around 04/28/2024) for CPE.     I discussed the assessment and treatment plan with the patient  The patient was provided an opportunity to ask questions and all were answered. The patient agreed with the plan and demonstrated an understanding of the instructions.   The patient was advised to call back or seek an in-person evaluation if the symptoms worsen or if the condition fails to improve as anticipated.    Sherlyn Hay, DO  [facility] [phone]) [phone] (fax)  [facility]

## 2023-04-29 NOTE — Patient Instructions (Signed)
Health Maintenance, Female Adopting a healthy lifestyle and getting preventive care are important in promoting health and wellness. Ask your health care provider about: The right schedule for you to have regular tests and exams. Things you can do on your own to prevent diseases and keep yourself healthy. What should I know about diet, weight, and exercise? Eat a healthy diet  Eat a diet that includes plenty of vegetables, fruits, low-fat dairy products, and lean protein. Do not eat a lot of foods that are high in solid fats, added sugars, or sodium. Maintain a healthy weight Body mass index (BMI) is used to identify weight problems. It estimates body fat based on height and weight. Your health care provider can help determine your BMI and help you achieve or maintain a healthy weight. Get regular exercise Get regular exercise. This is one of the most important things you can do for your health. Most adults should: Exercise for at least 150 minutes each week. The exercise should increase your heart rate and make you sweat (moderate-intensity exercise). Do strengthening exercises at least twice a week. This is in addition to the moderate-intensity exercise. Spend less time sitting. Even light physical activity can be beneficial. Watch cholesterol and blood lipids Have your blood tested for lipids and cholesterol at 69 years of age, then have this test every 5 years. Have your cholesterol levels checked more often if: Your lipid or cholesterol levels are high. You are older than 69 years of age. You are at high risk for heart disease. What should I know about cancer screening? Depending on your health history and family history, you may need to have cancer screening at various ages. This may include screening for: Breast cancer. Cervical cancer. Colorectal cancer. Skin cancer. Lung cancer. What should I know about heart disease, diabetes, and high blood pressure? Blood pressure and heart  disease High blood pressure causes heart disease and increases the risk of stroke. This is more likely to develop in people who have high blood pressure readings or are overweight. Have your blood pressure checked: Every 3-5 years if you are 18-39 years of age. Every year if you are 40 years old or older. Diabetes Have regular diabetes screenings. This checks your fasting blood sugar level. Have the screening done: Once every three years after age 40 if you are at a normal weight and have a low risk for diabetes. More often and at a younger age if you are overweight or have a high risk for diabetes. What should I know about preventing infection? Hepatitis B If you have a higher risk for hepatitis B, you should be screened for this virus. Talk with your health care provider to find out if you are at risk for hepatitis B infection. Hepatitis C Testing is recommended for: Everyone born from 1945 through 1965. Anyone with known risk factors for hepatitis C. Sexually transmitted infections (STIs) Get screened for STIs, including gonorrhea and chlamydia, if: You are sexually active and are younger than 69 years of age. You are older than 69 years of age and your health care provider tells you that you are at risk for this type of infection. Your sexual activity has changed since you were last screened, and you are at increased risk for chlamydia or gonorrhea. Ask your health care provider if you are at risk. Ask your health care provider about whether you are at high risk for HIV. Your health care provider may recommend a prescription medicine to help prevent HIV   infection. If you choose to take medicine to prevent HIV, you should first get tested for HIV. You should then be tested every 3 months for as long as you are taking the medicine. Pregnancy If you are about to stop having your period (premenopausal) and you may become pregnant, seek counseling before you get pregnant. Take 400 to 800  micrograms (mcg) of folic acid every day if you become pregnant. Ask for birth control (contraception) if you want to prevent pregnancy. Osteoporosis and menopause Osteoporosis is a disease in which the bones lose minerals and strength with aging. This can result in bone fractures. If you are 36 years old or older, or if you are at risk for osteoporosis and fractures, ask your health care provider if you should: Be screened for bone loss. Take a calcium or vitamin D supplement to lower your risk of fractures. Be given hormone replacement therapy (HRT) to treat symptoms of menopause. Follow these instructions at home: Alcohol use Do not drink alcohol if: Your health care provider tells you not to drink. You are pregnant, may be pregnant, or are planning to become pregnant. If you drink alcohol: Limit how much you have to: 0-1 drink a day. Know how much alcohol is in your drink. In the [place] one drink equals one 12 oz bottle of beer (355 mL), one 5 oz glass of wine (148 mL), or one 1 oz glass of hard liquor (44 mL). Lifestyle Do not use any products that contain nicotine or tobacco. These products include cigarettes, chewing tobacco, and vaping devices, such as e-cigarettes. If you need help quitting, ask your health care provider. Do not use street drugs. Do not share needles. Ask your health care provider for help if you need support or information about quitting drugs. General instructions Schedule regular health, dental, and eye exams. Stay current with your vaccines. Tell your health care provider if: You often feel depressed. You have ever been abused or do not feel safe at home. Summary Adopting a healthy lifestyle and getting preventive care are important in promoting health and wellness. Follow your health care provider's instructions about healthy diet, exercising, and getting tested or screened for diseases. Follow your health care provider's instructions on monitoring your  cholesterol and blood pressure. This information is not intended to replace advice given to you by your health care provider. Make sure you discuss any questions you have with your health care provider. Document Revised: 10/24/2020 Document Reviewed: 10/24/2020 Elsevier Patient Education  2024 [employer]     Why follow it? Research shows. Those who follow the Mediterranean diet have a reduced risk of heart disease  The diet is associated with a reduced incidence of Parkinson's and Alzheimer's diseases People following the diet may have longer life expectancies and lower rates of chronic diseases  The Dietary Guidelines for Americans recommends the Mediterranean diet as an eating plan to promote health and prevent disease  What Is the Mediterranean Diet?  Healthy eating plan based on typical foods and recipes of Mediterranean-style cooking The diet is primarily a plant based diet; these foods should make up a majority of meals   Starches - Plant based foods should make up a majority of meals - They are an important sources of vitamins, minerals, energy, antioxidants, and fiber - Choose whole grains, foods high in fiber and minimally processed items  - Typical grain sources include wheat, oats, barley, corn, brown rice, bulgar, farro, millet, polenta, couscous  - Various types of beans  include chickpeas, lentils, fava beans, black beans, white beans   Fruits  Veggies - Large quantities of antioxidant rich fruits & veggies; 6 or more servings  - Vegetables can be eaten raw or lightly drizzled with oil and cooked  - Vegetables common to the traditional Mediterranean Diet include: artichokes, arugula, beets, broccoli, brussel sprouts, cabbage, carrots, celery, collard greens, cucumbers, eggplant, kale, leeks, lemons, lettuce, mushrooms, okra, onions, peas, peppers, potatoes, pumpkin, radishes, rutabaga, shallots, spinach, sweet potatoes, turnips, zucchini - Fruits common to the Mediterranean  Diet include: apples, apricots, avocados, cherries, clementines, dates, figs, grapefruits, grapes, melons, nectarines, oranges, peaches, pears, pomegranates, strawberries, tangerines  Fats - Replace butter and margarine with healthy oils, such as olive oil, canola oil, and tahini  - Limit nuts to no more than a handful a day  - Nuts include walnuts, almonds, pecans, pistachios, pine nuts  - Limit or avoid candied, honey roasted or heavily salted nuts - Olives are central to the Mediterranean diet - can be eaten whole or used in a variety of dishes   Meats Protein - Limiting red meat: no more than a few times a month - When eating red meat: choose lean cuts and keep the portion to the size of deck of cards - Eggs: approx. 0 to 4 times a week  - Fish and lean poultry: at least 2 a week  - Healthy protein sources include, chicken, turkey, lean beef, lamb - Increase intake of seafood such as tuna, salmon, trout, mackerel, shrimp, scallops - Avoid or limit high fat processed meats such as sausage and bacon  Dairy - Include moderate amounts of low fat dairy products  - Focus on healthy dairy such as fat free yogurt, skim milk, low or reduced fat cheese - Limit dairy products higher in fat such as whole or 2% milk, cheese, ice cream  Alcohol - Moderate amounts of red wine is ok  - No more than 5 oz daily for women (all ages) and men older than age 65  - No more than 10 oz of wine daily for men younger than 65  Other - Limit sweets and other desserts  - Use herbs and spices instead of salt to flavor foods  - Herbs and spices common to the traditional Mediterranean Diet include: basil, bay leaves, chives, cloves, cumin, fennel, garlic, lavender, marjoram, mint, oregano, parsley, pepper, rosemary, sage, savory, sumac, tarragon, thyme   It's not just a diet, it's a lifestyle:  The Mediterranean diet includes lifestyle factors typical of those in the region  Foods, drinks and meals are best eaten with  others and savored Daily physical activity is important for overall good health This could be strenuous exercise like running and aerobics This could also be more leisurely activities such as walking, housework, yard-work, or taking the stairs Moderation is the key; a balanced and healthy diet accommodates most foods and drinks Consider portion sizes and frequency of consumption of certain foods   Meal Ideas & Options:  Breakfast:  Whole wheat toast or whole wheat English muffins with peanut butter & hard boiled egg Steel cut oats topped with apples & cinnamon and skim milk  Fresh fruit: banana, strawberries, melon, berries, peaches  Smoothies: strawberries, bananas, greek yogurt, peanut butter Low fat greek yogurt with blueberries and granola  Egg white omelet with spinach and mushrooms Breakfast couscous: whole wheat couscous, apricots, skim milk, cranberries  Sandwiches:  Hummus and grilled vegetables (peppers, zucchini, squash) on whole wheat bread   Grilled   chicken on whole wheat pita with lettuce, tomatoes, cucumbers or tzatziki  [place] salad on whole wheat bread: tuna salad made with greek yogurt, olives, red peppers, capers, green onions Garlic rosemary lamb pita: lamb sauted with garlic, rosemary, salt & pepper; add lettuce, cucumber, greek yogurt to pita - flavor with lemon juice and black pepper  Seafood:  Mediterranean grilled salmon, seasoned with garlic, basil, parsley, lemon juice and black pepper Shrimp, lemon, and spinach whole-grain pasta salad made with low fat greek yogurt  Seared scallops with lemon orzo  Seared tuna steaks seasoned salt, pepper, coriander topped with tomato mixture of olives, tomatoes, olive oil, minced garlic, parsley, green onions and cappers  Meats:  Herbed greek chicken salad with kalamata olives, cucumber, feta  Red bell peppers stuffed with spinach, bulgur, lean ground beef (or lentils) & topped with feta   Kebabs: skewers of chicken, tomatoes,  onions, zucchini, squash  [place] burgers: made with red onions, mint, dill, lemon juice, feta cheese topped with roasted red peppers Vegetarian Cucumber salad: cucumbers, artichoke hearts, celery, red onion, feta cheese, tossed in olive oil & lemon juice  Hummus and whole grain pita points with a greek salad (lettuce, tomato, feta, olives, cucumbers, red onion) Lentil soup with celery, carrots made with vegetable broth, garlic, salt and pepper  Tabouli salad: parsley, bulgur, mint, scallions, cucumbers, tomato, radishes, lemon juice, olive oil, salt and pepper.      American Heart Association (AHA) Exercise Recommendation  Being physically active is important to prevent heart disease and stroke, the nation's No. 1and No. 5killers. To improve overall cardiovascular health, we suggest at least 150 minutes per week of moderate exercise or 75 minutes per week of vigorous exercise (or a combination of moderate and vigorous activity). Thirty minutes a day, five times a week is an easy goal to remember. You will also experience benefits even if you divide your time into two or three segments of 10 to 15 minutes per day.  For people who would benefit from lowering their blood pressure or cholesterol, we recommend 40 minutes of aerobic exercise of moderate to vigorous intensity three to four times a week to lower the risk for heart attack and stroke.  Physical activity is anything that makes you move your body and burn calories.  This includes things like climbing stairs or playing sports. Aerobic exercises benefit your heart, and include walking, jogging, swimming or biking. Strength and stretching exercises are best for overall stamina and flexibility.  The simplest, positive change you can make to effectively improve your heart health is to start walking. It's enjoyable, free, easy, social and great exercise. A walking program is flexible and boasts high success rates because people can stick with it.  It's easy for walking to become a regular and satisfying part of life.   For Overall Cardiovascular Health: At least 30 minutes of moderate-intensity aerobic activity at least 5 days per week for a total of 150  OR  At least 25 minutes of vigorous aerobic activity at least 3 days per week for a total of 75 minutes; or a combination of moderate- and vigorous-intensity aerobic activity  AND  Moderate- to high-intensity muscle-strengthening activity at least 2 days per week for additional health benefits.  For Lowering Blood Pressure and Cholesterol An average 40 minutes of moderate- to vigorous-intensity aerobic activity 3 or 4 times per week  What if I can't make it to the time goal? Something is always better than nothing! And everyone has to  start somewhere. Even if you've been sedentary for years, today is the day you can begin to make healthy changes in your life. If you don't think you'll make it for 30 or 40 minutes, set a reachable goal for today. You can work up toward your overall goal by increasing your time as you get stronger. Don't let all-or-nothing thinking rob you of doing what you can every day.  Source:http://www.heart.org

## 2023-04-30 LAB — VITAMIN D 25 HYDROXY (VIT D DEFICIENCY, FRACTURES): Vit D, 25-Hydroxy: 27.6 ng/mL — ABNORMAL LOW (ref 30.0–100.0)

## 2023-04-30 LAB — COMPREHENSIVE METABOLIC PANEL
ALT: 14 [IU]/L (ref 0–32)
AST: 20 [IU]/L (ref 0–40)
Albumin: 4.1 g/dL (ref 3.9–4.9)
Alkaline Phosphatase: 92 [IU]/L (ref 44–121)
BUN/Creatinine Ratio: 19 (ref 12–28)
BUN: 14 mg/dL (ref 8–27)
Bilirubin Total: 0.3 mg/dL (ref 0.0–1.2)
CO2: 25 mmol/L (ref 20–29)
Calcium: 9.5 mg/dL (ref 8.7–10.3)
Chloride: 104 mmol/L (ref 96–106)
Creatinine, Ser: 0.73 mg/dL (ref 0.57–1.00)
Globulin, Total: 2.8 g/dL (ref 1.5–4.5)
Glucose: 90 mg/dL (ref 70–99)
Potassium: 4.6 mmol/L (ref 3.5–5.2)
Sodium: 143 mmol/L (ref 134–144)
Total Protein: 6.9 g/dL (ref 6.0–8.5)
eGFR: 89 mL/min/{1.73_m2} (ref >59)

## 2023-04-30 LAB — CBC
Hematocrit: 35.9 % (ref 34.0–46.6)
Hemoglobin: 11.4 g/dL (ref 11.1–15.9)
MCH: 30 pg (ref 26.6–33.0)
MCHC: 31.8 g/dL (ref 31.5–35.7)
MCV: 95 fL (ref 79–97)
Platelets: 291 10*3/uL (ref 150–450)
RBC: 3.8 x10E6/uL (ref 3.77–5.28)
RDW: 13.3 % (ref 11.7–15.4)
WBC: 3.5 10*3/uL (ref 3.4–10.8)

## 2023-04-30 LAB — LIPID PANEL
Chol/HDL Ratio: 2.9 ratio (ref 0.0–4.4)
Cholesterol, Total: 174 mg/dL (ref 100–199)
HDL: 61 mg/dL (ref >39)
LDL Chol Calc (NIH): 99 mg/dL (ref 0–99)
Triglycerides: 77 mg/dL (ref 0–149)
VLDL Cholesterol Cal: 14 mg/dL (ref 5–40)

## 2023-04-30 LAB — CERVICOVAGINAL ANCILLARY ONLY
Bacterial Vaginitis (gardnerella): NEGATIVE
Candida Glabrata: NEGATIVE
Candida Vaginitis: NEGATIVE
Comment: NEGATIVE
Comment: NEGATIVE
Comment: NEGATIVE

## 2023-04-30 LAB — VITAMIN B12: Vitamin B-12: 558 pg/mL (ref 232–1245)

## 2023-05-06 ENCOUNTER — Telehealth: Payer: Self-pay

## 2023-05-06 NOTE — Telephone Encounter (Signed)
Labs not yet signd  Copied from CRM [id number]. Topic: General - Other >> May 06, 2023 11:00 AM Franchot Heidelberg wrote: Reason for CRM: Pt called requesting to discuss her labs from 04/29/2023.   Best contact: [phone]

## 2023-05-09 NOTE — Telephone Encounter (Signed)
Patient has called to check on her Lab Results. Patient is aware Dr Jacquenette Shone is out of the office today. Please follow back up with the patient at phone # [phone]

## 2023-05-13 ENCOUNTER — Encounter: Payer: Self-pay | Admitting: Family Medicine

## 2023-05-13 DIAGNOSIS — Z5320 Procedure and treatment not carried out because of patient's decision for unspecified reasons: Secondary | ICD-10-CM | POA: Insufficient documentation

## 2023-05-13 DIAGNOSIS — Z0000 Encounter for general adult medical examination without abnormal findings: Secondary | ICD-10-CM | POA: Insufficient documentation

## 2023-05-13 DIAGNOSIS — F17200 Nicotine dependence, unspecified, uncomplicated: Secondary | ICD-10-CM | POA: Insufficient documentation

## 2023-05-13 DIAGNOSIS — K219 Gastro-esophageal reflux disease without esophagitis: Secondary | ICD-10-CM | POA: Insufficient documentation

## 2023-05-13 NOTE — Assessment & Plan Note (Signed)
Patient reports chronic knee pain, managed with ibuprofen 800mg  as needed. -Reduce ibuprofen dosage to 600mg  as needed to minimize kidney impact.

## 2023-05-13 NOTE — Assessment & Plan Note (Signed)
Patient is a current smoker with a 40-year half-pack per day history. Patient declines lung cancer screening and has no interest in smoking cessation.

## 2023-05-13 NOTE — Assessment & Plan Note (Addendum)
Patient is up to date on vaccinations, including shingles and COVID-19.  Patient is past the age for recommended Pap smears. -Check Vitamin D level due to previous low levels and vitamin B12 level due to long-term treatment with PPI. -Prescribe multivitamin as requested by patient. -Recommend tetanus vaccine (Tdap) at pharmacy. -Schedule annual follow-up appointment.

## 2023-05-13 NOTE — Assessment & Plan Note (Signed)
Patient reports external vaginal irritation. No discharge reported. Previous treatment with triamcinolone ointment provided relief. Patient also reports discomfort during pelvic exam, suggesting possible atrophic vaginitis. -Perform yeast infection test. -Represcribe triamcinolone ointment for external irritation. -Consider oral medication if positive for infection.

## 2023-05-24 ENCOUNTER — Telehealth: Payer: Self-pay | Admitting: Family Medicine

## 2023-05-24 DIAGNOSIS — E559 Vitamin D deficiency, unspecified: Secondary | ICD-10-CM

## 2023-05-24 MED ORDER — VITAMIN D (ERGOCALCIFEROL) 1.25 MG (50000 UNIT) PO CAPS: 50000.0000 [IU] | ORAL_CAPSULE | ORAL | 1 refills | Status: AC

## 2023-05-24 NOTE — Telephone Encounter (Signed)
Pt. Given lab results and instructions. Would like Vit D sent to her pharmacy.

## 2023-05-24 NOTE — Addendum Note (Signed)
Addended by: Jacquenette Shone on: 05/24/2023 07:22 PM   Modules accepted: Orders

## 2023-06-03 ENCOUNTER — Other Ambulatory Visit: Payer: Self-pay | Admitting: Physician Assistant

## 2023-06-03 ENCOUNTER — Other Ambulatory Visit: Payer: Self-pay | Admitting: Family Medicine

## 2023-06-03 DIAGNOSIS — Z79899 Other long term (current) drug therapy: Secondary | ICD-10-CM | POA: Diagnosis not present

## 2023-06-03 DIAGNOSIS — N898 Other specified noninflammatory disorders of vagina: Secondary | ICD-10-CM

## 2023-06-03 DIAGNOSIS — M1710 Unilateral primary osteoarthritis, unspecified knee: Secondary | ICD-10-CM

## 2023-06-03 DIAGNOSIS — L2089 Other atopic dermatitis: Secondary | ICD-10-CM | POA: Diagnosis not present

## 2023-06-27 ENCOUNTER — Ambulatory Visit: Payer: Self-pay | Admitting: *Deleted

## 2023-06-27 DIAGNOSIS — R051 Acute cough: Secondary | ICD-10-CM

## 2023-06-27 NOTE — Telephone Encounter (Signed)
  Chief Complaint: cough- patient requesting cough medication- pearls Symptoms: productive cough- interrupts sleep, some coughing at work Frequency: 2 days Pertinent Negatives: Patient denies fever, nasal congestion Disposition: [] ED /[] Urgent Care (no appt availability in office) / [] Appointment(In office/virtual)/ []  [facility]/ [x] Home Care/ [] Refused Recommended Disposition /[] [facility]/ []  Follow-up with PCP Additional Notes: Patient is requesting cough medication- she states people at work have this cough and she now has developed cough. She states it is not bad now- but does not want it to get bad and is requesting cough medication.  Patient does not feel she needs appointment - but would like to calm the cough.

## 2023-06-27 NOTE — Telephone Encounter (Signed)
 Summary: Rx requested - cough   Pt requesting yellow pearls to be called in for cough and drainage. Pt states the cough started 2 days ago.  Please assist pt further     Reason for Disposition  Cough  Answer Assessment - Initial Assessment Questions 1. ONSET: When did the cough begin?      2 days 2. SEVERITY: How bad is the cough today?      Worse at night- but patient is also coughing during the day 3. SPUTUM: Describe the color of your sputum (none, dry cough; clear, white, yellow, green)     Yellow- not much 4. HEMOPTYSIS: Are you coughing up any blood? If so ask: How much? (flecks, streaks, tablespoons, etc.)     no 5. DIFFICULTY BREATHING: Are you having difficulty breathing? If Yes, ask: How bad is it? (e.g., mild, moderate, severe)    - MILD: No SOB at rest, mild SOB with walking, speaks normally in sentences, can lie down, no retractions, pulse < 100.    - MODERATE: SOB at rest, SOB with minimal exertion and prefers to sit, cannot lie down flat, speaks in phrases, mild retractions, audible wheezing, pulse 100-120.    - SEVERE: Very SOB at rest, speaks in single words, struggling to breathe, sitting hunched forward, retractions, pulse > 120      No problems 6. FEVER: Do you have a fever? If Yes, ask: What is your temperature, how was it measured, and when did it start?     no  10. OTHER SYMPTOMS: Do you have any other symptoms? (e.g., runny nose, wheezing, chest pain)       no  12. TRAVEL: Have you traveled out of the country in the last month? 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

## 2023-06-28 MED ORDER — BENZONATATE 100 MG PO CAPS: 100.0000 mg | ORAL_CAPSULE | Freq: Three times a day (TID) | ORAL | 0 refills | Status: DC | PRN

## 2023-06-28 NOTE — Addendum Note (Signed)
 Addended by: Jacquenette Shone on: 06/28/2023 10:03 PM   Modules accepted: Orders

## 2023-08-19 ENCOUNTER — Telehealth: Payer: Self-pay

## 2023-08-19 DIAGNOSIS — M1710 Unilateral primary osteoarthritis, unspecified knee: Secondary | ICD-10-CM

## 2023-08-19 DIAGNOSIS — R600 Localized edema: Secondary | ICD-10-CM

## 2023-08-19 NOTE — Telephone Encounter (Signed)
 Copied from CRM [id number]. Topic: Clinical - Prescription Issue >> Aug 19, 2023 11:05 AM Antony Haste wrote: Reason for CRM: The patient is requesting a refill for her Advil 800 MG tablet and her medication used to treat the fluid in her body. She would like this sent to:[id number] - [place], [place] - [address] She is unsure of the specific name of the medication used to treat the fluid. Callback [phone]

## 2023-08-23 MED ORDER — IBUPROFEN 800 MG PO TABS: 800.0000 mg | ORAL_TABLET | Freq: Every evening | ORAL | 0 refills | Status: DC | PRN

## 2023-08-23 MED ORDER — FUROSEMIDE 20 MG PO TABS: 20.0000 mg | ORAL_TABLET | Freq: Every day | ORAL | 0 refills | Status: DC | PRN

## 2023-08-23 NOTE — Addendum Note (Signed)
 Addended by: Jacquenette Shone on: 08/23/2023 04:38 PM   Modules accepted: Orders

## 2023-10-01 DIAGNOSIS — H16143 Punctate keratitis, bilateral: Secondary | ICD-10-CM | POA: Diagnosis not present

## 2023-10-01 DIAGNOSIS — H524 Presbyopia: Secondary | ICD-10-CM | POA: Diagnosis not present

## 2023-10-08 ENCOUNTER — Other Ambulatory Visit: Payer: Self-pay | Admitting: Family Medicine

## 2023-10-08 ENCOUNTER — Ambulatory Visit: Admitting: Podiatry

## 2023-10-08 DIAGNOSIS — L600 Ingrowing nail: Secondary | ICD-10-CM

## 2023-10-08 DIAGNOSIS — D649 Anemia, unspecified: Secondary | ICD-10-CM

## 2023-10-08 NOTE — Progress Notes (Signed)
 Subjective:  Patient ID: Cathy Townsend, female    DOB: 12-05-1955,  MRN: [id number]  Chief Complaint  Patient presents with   Nail Problem    Pt stated that she needs her nails trimmed and the callus taken off the side of her big toe     68 y.o. female presents with the above complaint.  Patient presents with complaint of bilateral hallux nail dystrophy with underlying ingrown painful to touch is progressive gotten worse worse with ambulation or shoe pressure patient would like to have removed made permanent has not seen MRIs prior to seeing me pain scale 7 out of 10 dull aching nature.   Review of Systems: Negative except as noted in the HPI. Denies N/V/F/Ch.  Past Medical History:  Diagnosis Date   Allergy    Arthritis    "think I have it in both knees" (05/06/2012)   Hyperlipidemia    Hypertension    PONV (postoperative nausea and vomiting)     Current Outpatient Medications:    aspirin  EC 81 MG tablet, Take 81 mg by mouth., Disp: , Rfl:    atorvastatin  (LIPITOR) 20 MG tablet, Take 1 tablet (20 mg total) by mouth daily., Disp: 90 tablet, Rfl: 0   benzonatate  (TESSALON ) 100 MG capsule, Take 1 capsule (100 mg total) by mouth 3 (three) times daily as needed for cough., Disp: 30 capsule, Rfl: 0   cetirizine  (ZYRTEC ) 10 MG tablet, Take 1 tablet (10 mg total) by mouth every morning., Disp: 90 tablet, Rfl: 3   clobetasol  cream (TEMOVATE ) 0.05 %, Apply 1 Application topically 2 (two) times daily., Disp: 30 g, Rfl: 0   furosemide  (LASIX ) 20 MG tablet, Take 1 tablet (20 mg total) by mouth daily as needed for edema., Disp: 90 tablet, Rfl: 0   ibuprofen  (ADVIL ) 800 MG tablet, Take 1 tablet (800 mg total) by mouth at bedtime as needed., Disp: 30 tablet, Rfl: 0   Iron , Ferrous Sulfate , 325 (65 Fe) MG TABS, Take 325 mg by mouth daily., Disp: 90 tablet, Rfl: 1   montelukast  (SINGULAIR ) 10 MG tablet, Take 1 tablet (10 mg total) by mouth daily., Disp: 90 tablet, Rfl: 3   Multiple  Vitamins-Minerals (MULTIVITAMIN WOMEN 50+) TABS, Take 1 tablet by mouth daily., Disp: 90 tablet, Rfl: 3   omeprazole  (PRILOSEC) 20 MG capsule, Take 1 capsule (20 mg total) by mouth daily., Disp: 90 capsule, Rfl: 1   triamcinolone  (KENALOG ) 0.025 % ointment, APPLY TOPICALLY TWICE A DAY, Disp: 30 g, Rfl: 0   Vitamin D , Ergocalciferol , (DRISDOL ) 1.25 MG (50000 UNIT) CAPS capsule, Take 1 capsule (50,000 Units total) by mouth every 7 (seven) days., Disp: 12 capsule, Rfl: 1  Social History   Tobacco Use  Smoking Status Every Day   Current packs/day: 0.50   Average packs/day: 0.5 packs/day for 40.0 years (20.0 ttl pk-yrs)   Types: Cigarettes  Smokeless Tobacco Never    Allergies  Allergen Reactions   Oxycodone -Acetaminophen  Itching   Tramadol Itching   Meloxicam Rash   Objective:  There were no vitals filed for this visit. There is no height or weight on file to calculate BMI. Constitutional Well developed. Well nourished.  Vascular Dorsalis pedis pulses palpable bilaterally. Posterior tibial pulses palpable bilaterally. Capillary refill normal to all digits.  No cyanosis or clubbing noted. Pedal hair growth normal.  Neurologic Normal speech. Oriented to person, place, and time. Epicritic sensation to light touch grossly present bilaterally.  Dermatologic Pain on palpation of the entire/total nail on 1st  digit of the bilaterally No other open wounds. No skin lesions.  Orthopedic: Normal joint ROM without pain or crepitus bilaterally. No visible deformities. No bony tenderness.   Radiographs: None Assessment:   1. Ingrown toenail of right foot   2. Ingrown left big toenail    Plan:  Patient was evaluated and treated and all questions answered.  Nail contusion/dystrophy hallux, bilaterally -Patient elects to proceed with minor surgery to remove entire toenail today. Consent reviewed and signed by patient. -Entire/total nail excised. See procedure note. -Educated on  post-procedure care including soaking. Written instructions provided and reviewed. -Patient to follow up in 2 weeks for nail check.  Procedure: Excision of entire/total nail  Location: Bilateral 1st toe digit Anesthesia: Lidocaine  1% plain; 1.5 mL and Marcaine  0.5% plain; 1.5 mL, digital block. Skin Prep: Betadine. Dressing: Silvadene; telfa; dry, sterile, compression dressing. Technique: Following skin prep, the toe was exsanguinated and a tourniquet was secured at the base of the toe. The affected nail border was freed and excised. The tourniquet was then removed and sterile dressing applied. Disposition: Patient tolerated procedure well. Patient to return in 2 weeks for follow-up.   Return in about 3 months (around 01/07/2024) for rfc.

## 2023-10-08 NOTE — Patient Instructions (Signed)

## 2023-11-14 NOTE — Telephone Encounter (Signed)
 Cathy Townsend

## 2023-11-20 ENCOUNTER — Ambulatory Visit: Payer: Self-pay

## 2023-11-20 NOTE — Telephone Encounter (Signed)
 FYI Only or Action Required?: FYI only for provider  Patient was last seen in primary care on 04/29/2023 by Carlean Charter, DO. Called Nurse Triage reporting Vaginal Itching. Symptoms began a week ago. Interventions attempted: Prescription medications: Ointment?. Symptoms are: unchanged.  Triage Disposition: See PCP When Office is Open (Within 3 Days)  Patient/caregiver understands and will follow disposition?: Yes     Copied from CRM [id number]. Topic: Clinical - Red Word Triage >> Nov 20, 2023  3:56 PM Donald Frost wrote: Red Word that prompted transfer to Nurse Triage: The patient called in stating she has been having severe itching for about a week now. She says she has had this previously and was prescribed a cream in the past to help her. She was referred to an Ob/gyn but didn't feel comfortable there so she did not call them back to reschedule. Due to her level of uncomfortability and the fact her provider does not have availability for about a month I will transfer her to E2C2 NT Reason for Disposition  [1] Vaginal itching AND [2] not improved > 3 days following Care Advice  Answer Assessment - Initial Assessment Questions 1. SYMPTOM: "What's the main symptom you're concerned about?" (e.g., pain, itching, dryness)     Itching 2. LOCATION: "Where is the  itching located?" (e.g., inside/outside, left/right)     Left side going toward the middle  3. ONSET: "When did the  itching   start?"     Comes and goes for a week  4. PAIN: "Is there any pain?" If Yes, ask: "How bad is it?" (Scale: 1-10; mild, moderate, severe)   -  MILD (1-3): Doesn't interfere with normal activities.    -  MODERATE (4-7): Interferes with normal activities (e.g., work or school) or awakens from sleep.     -  SEVERE (8-10): Excruciating pain, unable to do any normal activities.     No pain  5. ITCHING: "Is there any itching?" If Yes, ask: "How bad is it?" (Scale: 1-10; mild, moderate, severe)     Mild to moderate  itching  6. CAUSE: "What do you think is causing the discharge?" "Have you had the same problem before? What happened then?"     No discharge. Had the same problem before and was given an oitment   7. OTHER SYMPTOMS: "Do you have any other symptoms?" (e.g., fever, itching, vaginal bleeding, pain with urination, injury to genital area, vaginal foreign body)     No  8. PREGNANCY: "Is there any chance you are pregnant?" "When was your last menstrual period?"     No   No appts avail within needed time, RN advising UC, pt refusing. Call placed to [facility] for assistance, spoke with Grafton Lawrence, pt agreeable to 6/10 at 11am.  Protocols used: Vaginal Symptoms-A-AH

## 2023-11-26 ENCOUNTER — Other Ambulatory Visit (HOSPITAL_COMMUNITY)
Admission: RE | Admit: 2023-11-26 | Discharge: 2023-11-26 | Disposition: A | Discharge status: Home / Self Care | Source: Ambulatory Visit | Attending: Physician Assistant | Admitting: Physician Assistant

## 2023-11-26 ENCOUNTER — Encounter: Payer: Self-pay | Admitting: Physician Assistant

## 2023-11-26 ENCOUNTER — Ambulatory Visit (INDEPENDENT_AMBULATORY_CARE_PROVIDER_SITE_OTHER): Admitting: Physician Assistant

## 2023-11-26 VITALS — BP 127/59 | HR 74 | Resp 16

## 2023-11-26 DIAGNOSIS — N898 Other specified noninflammatory disorders of vagina: Secondary | ICD-10-CM | POA: Insufficient documentation

## 2023-11-26 DIAGNOSIS — R21 Rash and other nonspecific skin eruption: Secondary | ICD-10-CM | POA: Diagnosis not present

## 2023-11-26 DIAGNOSIS — L80 Vitiligo: Secondary | ICD-10-CM | POA: Diagnosis not present

## 2023-11-26 LAB — POCT URINALYSIS DIPSTICK
Bilirubin, UA: NEGATIVE
Blood, UA: NEGATIVE
Glucose, UA: NEGATIVE
Ketones, UA: NEGATIVE
Leukocytes, UA: NEGATIVE
Nitrite, UA: NEGATIVE
Protein, UA: NEGATIVE
Spec Grav, UA: 1.02 (ref 1.010–1.025)
Urobilinogen, UA: 0.2 U/dL
pH, UA: 5 (ref 5.0–8.0)

## 2023-11-26 MED ORDER — HYDROXYZINE HCL 10 MG PO TABS: 10.0000 mg | ORAL_TABLET | Freq: Three times a day (TID) | ORAL | 0 refills | Status: DC | PRN

## 2023-11-26 MED ORDER — KETOCONAZOLE 2 % EX CREA: 1.0000 | TOPICAL_CREAM | Freq: Every day | CUTANEOUS | 0 refills | Status: AC

## 2023-11-26 MED ORDER — TRIAMCINOLONE ACETONIDE 0.025 % EX OINT: TOPICAL_OINTMENT | Freq: Two times a day (BID) | CUTANEOUS | 0 refills | Status: AC

## 2023-11-26 NOTE — Progress Notes (Signed)
 Established patient visit  [name]   DOB: [birth year]   68 y.o. Female  MRN: [id number] Visit Date: 11/26/2023  Today's healthcare provider: Blane Bunting, PA-C   Chief Complaint  Patient presents with   Vaginal Itching    Vag itching x1 week ( more on the outside ) Wants discuss something to curve her  appetite   Subjective     HPI     Vaginal Itching    Additional comments: Vag itching x1 week ( more on the outside ) Wants discuss something to curve her  appetite      Last edited by Estill Hemming, CMA on 11/26/2023 11:04 AM.       Discussed the use of AI scribe software for clinical note transcription with the patient, who gave verbal consent to proceed.  History of Present Illness Cathy Townsend is a 68 year old female who presents with persistent external vaginal itching.  She experiences persistent itching localized to the external vaginal area, specifically on the left side. There is no associated vaginal discharge, burning sensation, or frequent urination. She has used triamcinolone  ointment previously, which provided some relief but was not completely effective, and she has since run out of the medication. The itching occurs at any time of the day.       03/05/2023    8:26 AM 10/30/2022    1:28 PM 04/24/2022    8:54 AM  Depression screen PHQ 2/9  Decreased Interest 0 0 0  Down, Depressed, Hopeless 0 0 0  PHQ - 2 Score 0 0 0  Altered sleeping  0 0  Tired, decreased energy  0 0  Change in appetite  0 0  Feeling bad or failure about yourself   0 0  Trouble concentrating  0 0  Moving slowly or fidgety/restless  0 0  Suicidal thoughts  0 0  PHQ-9 Score  0 0  Difficult doing work/chores  Not difficult at all Not difficult at all    Medications: Outpatient Medications Prior to Visit  Medication Sig   aspirin  EC 81 MG tablet Take 81 mg by mouth.   atorvastatin  (LIPITOR) 20 MG tablet Take 1 tablet (20 mg total) by mouth daily.   benzonatate   (TESSALON ) 100 MG capsule Take 1 capsule (100 mg total) by mouth 3 (three) times daily as needed for cough.   cetirizine  (ZYRTEC ) 10 MG tablet Take 1 tablet (10 mg total) by mouth every morning.   clobetasol  cream (TEMOVATE ) 0.05 % Apply 1 Application topically 2 (two) times daily.   ferrous sulfate  325 (65 FE) MG tablet TAKE 325 MG BY MOUTH DAILY.   furosemide  (LASIX ) 20 MG tablet Take 1 tablet (20 mg total) by mouth daily as needed for edema.   ibuprofen  (ADVIL ) 800 MG tablet Take 1 tablet (800 mg total) by mouth at bedtime as needed.   montelukast  (SINGULAIR ) 10 MG tablet Take 1 tablet (10 mg total) by mouth daily.   Multiple Vitamins-Minerals (MULTIVITAMIN WOMEN 50+) TABS Take 1 tablet by mouth daily.   omeprazole  (PRILOSEC) 20 MG capsule Take 1 capsule (20 mg total) by mouth daily.   triamcinolone  (KENALOG ) 0.025 % ointment APPLY TOPICALLY TWICE A DAY   Vitamin D , Ergocalciferol , (DRISDOL ) 1.25 MG (50000 UNIT) CAPS capsule Take 1 capsule (50,000 Units total) by mouth every 7 (seven) days.   No facility-administered medications prior to visit.    Review of Systems All negative Except see HPI  Objective    BP (!) 127/59 (BP Location: Left Arm, Patient Position: Sitting, Cuff Size: Normal)   Pulse 74   Resp 16   SpO2 100%     Physical Exam Constitutional:      General: She is not in acute distress.    Appearance: Normal appearance.  HENT:     Head: Normocephalic.  Pulmonary:     Effort: Pulmonary effort is normal. No respiratory distress.  Skin:    Findings: Rash (white discoloration/vulva with scaling) present.  Neurological:     Mental Status: She is alert and oriented to person, place, and time. Mental status is at baseline.      No results found for any visits on 11/26/23.      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 Triamcinolone  ineffective. Differential includes eczema, fungal infection in the  setting vitiligo. Informed consent for steroid and antifungal ointments given. - Prescribe steroid ointment. - Prescribe antifungal medication. - Prescribe antihistamine for bedtime use. Needs to be checked for BV and yeast infection testing Consider dermatology vs obgyn if symptoms persist Will follow-up  Weight Management 20-pound weight gain over five years. Regular exercise. Appetite suppressant discussion deferred to her primary care provider. Informed about insurance documentation for weight management medications and prior authorization - Schedule appointment with primary care provider in one month for weight management discussion.   No orders of the defined types were placed in this encounter.   No follow-ups on file.   The patient was advised to call back or seek an in-person evaluation if the symptoms worsen or if the condition fails to improve as anticipated.  I discussed the assessment and treatment plan with the patient. The patient was provided an opportunity to ask questions and all were answered. The patient agreed with the plan and demonstrated an understanding of the instructions.  I, Journiee Feldkamp, PA-C have reviewed all documentation for this visit. The documentation on 11/26/2023  for the exam, diagnosis, procedures, and orders are all accurate and complete.  Blane Bunting, [facility], MMS [facility] [phone] (phone) [phone] (fax)  [facility]

## 2023-11-27 LAB — CERVICOVAGINAL ANCILLARY ONLY
Bacterial Vaginitis (gardnerella): NEGATIVE
Candida Glabrata: NEGATIVE
Candida Vaginitis: NEGATIVE
Comment: NEGATIVE
Comment: NEGATIVE
Comment: NEGATIVE

## 2023-11-28 ENCOUNTER — Ambulatory Visit: Payer: Self-pay | Admitting: Physician Assistant

## 2023-12-18 DIAGNOSIS — L2089 Other atopic dermatitis: Secondary | ICD-10-CM | POA: Diagnosis not present

## 2023-12-18 DIAGNOSIS — Z79899 Other long term (current) drug therapy: Secondary | ICD-10-CM | POA: Diagnosis not present

## 2024-01-13 ENCOUNTER — Ambulatory Visit: Admitting: Podiatry

## 2024-01-13 ENCOUNTER — Encounter: Payer: Self-pay | Admitting: Podiatry

## 2024-01-13 DIAGNOSIS — B351 Tinea unguium: Secondary | ICD-10-CM | POA: Diagnosis not present

## 2024-01-13 DIAGNOSIS — M79674 Pain in right toe(s): Secondary | ICD-10-CM

## 2024-01-13 DIAGNOSIS — M79675 Pain in left toe(s): Secondary | ICD-10-CM | POA: Diagnosis not present

## 2024-01-13 NOTE — Progress Notes (Signed)
  Subjective:  Patient ID: Cathy Townsend, female    DOB: 1955-11-10,  MRN: [id number]  68 y.o. female presents painful mycotic toenails of both feet that are difficult to trim. Pain interferes with daily activities and wearing enclosed shoe gear comfortably. She is s/p total matrixectomy of bilateral great toes. States both toes feet much better, but she cannot reach her other toes to trim her painful thick toenails. Chief Complaint  Patient presents with   RFC    Rm3 Routine foot Care/ not diabetic/ PCP Dr. Donzella November 2025   New problem(s): None   PCP is Donzella Lauraine SAILOR, DO.  Allergies  Allergen Reactions   Oxycodone -Acetaminophen  Itching   Tramadol Itching   Meloxicam Rash    Review of Systems: Negative except as noted in the HPI.   Objective:  Cathy Townsend is a pleasant 68 y.o. female obese in NAD. AAO x 3.  Vascular Examination: Vascular status intact b/l with palpable pedal pulses. CFT immediate b/l. Pedal hair present. Nonpitting edema b/l ankles/feet. No pain with calf compression b/l. Skin temperature gradient WNL b/l. No varicosities noted. No cyanosis or clubbing noted.  Neurological Examination: Sensation grossly intact b/l with 10 gram monofilament. Vibratory sensation intact b/l.  Dermatological Examination: Pedal skin with normal turgor, texture and tone b/l. No open wounds nor interdigital macerations noted. Evidence of total matrixectomy bilateral great toes. Procedure site of bilateral great toes noted to be completely healed with no erythema, no edema, no drainage, no purulence.  Toenails 2-5 b/l thick, discolored, elongated with subungual debris and pain on dorsal palpation. No hyperkeratotic lesions noted b/l.   Musculoskeletal Examination: Muscle strength 5/5 to b/l LE.  No pain, crepitus noted b/l. No gross pedal deformities. Patient ambulates independently without assistive aids.   Radiographs: None  Assessment:   1. Pain due to onychomycosis  of toenails of both feet    Plan:  Patient was evaluated and treated. All patient's and/or POA's questions/concerns addressed on today's visit. Toenails 2-5 bilaterally debrided in length and girth without incident. Continue soft, supportive shoe gear daily. Report any pedal injuries to medical professional. Call office if there are any questions/concerns. -Patient/POA to call should there be question/concern in the interim.  Return in about 3 months (around 04/14/2024).  Cathy Townsend, DPM      [facility] LOCATION: [address] [place] [address]                   Office [phone]   [facility] LOCATION: [address] [place], [place] [address] Office [phone]

## 2024-01-21 ENCOUNTER — Telehealth: Payer: Self-pay

## 2024-01-21 DIAGNOSIS — R600 Localized edema: Secondary | ICD-10-CM

## 2024-01-21 MED ORDER — FUROSEMIDE 20 MG PO TABS: 20.0000 mg | ORAL_TABLET | Freq: Every day | ORAL | 0 refills | Status: DC | PRN

## 2024-01-21 NOTE — Telephone Encounter (Signed)
 Copied from CRM #[id number]. Topic: Clinical - Medication Question >> Jan 21, 2024 10:51 AM Treva T wrote: Reason for CRM: Received call from patient, states she needs her fluid pill refill, at the increased dose that was mentioned previously by provider.   Patient states the name of the medication is  furosemide  (LASIX ), but she is unsure of the dose she is to be taking. But she states that she needs medication refilled.  Patient can be reached at [phone] if need to discuss further.  Preferred pharmacy:   [id number] - [place], [place] - [address] [place] [address] Phone: [phone] Fax: [phone]

## 2024-01-31 ENCOUNTER — Ambulatory Visit: Payer: Self-pay

## 2024-01-31 DIAGNOSIS — R051 Acute cough: Secondary | ICD-10-CM

## 2024-01-31 NOTE — Telephone Encounter (Signed)
 Reason for Disposition . Message left on unidentified voice mail. Phone number verified. . Third attempt to contact caller AND no contact made. Phone number verified.  Protocols used: No Contact or Duplicate Contact Call-A-AH

## 2024-01-31 NOTE — Telephone Encounter (Signed)
 Called patient. She would like provider to call something in for her. Not able to come to an appointment and didn't want to schedule for next week.

## 2024-01-31 NOTE — Telephone Encounter (Addendum)
 3rd attempt. No machine to leave voicemail. Alternate number reached, [email or web address].   2nd attempt. No machine to leave message.  1st attempt. No machine to leave message   Copied from CRM #[id number]. Topic: Clinical - Medication Question >> Jan 31, 2024 10:00 AM Cathy Townsend wrote: Reason for CRM: patient is calling, states she is congested, experiencing cough and sneezing and runny nose, she doesn't want to schedule an appointment but wants her provider to prescribe cough medicine/medication - advised her that appointment may be required before they are able to prescribe medication - she stated preference of Monday

## 2024-02-03 ENCOUNTER — Other Ambulatory Visit: Payer: Self-pay | Admitting: Family Medicine

## 2024-02-03 DIAGNOSIS — R051 Acute cough: Secondary | ICD-10-CM

## 2024-02-03 MED ORDER — BENZONATATE 100 MG PO CAPS: 100.0000 mg | ORAL_CAPSULE | Freq: Three times a day (TID) | ORAL | 0 refills | Status: DC | PRN

## 2024-03-10 ENCOUNTER — Telehealth: Payer: Self-pay

## 2024-03-10 ENCOUNTER — Ambulatory Visit (INDEPENDENT_AMBULATORY_CARE_PROVIDER_SITE_OTHER): Payer: Medicare Other

## 2024-03-10 VITALS — BP 128/82 | Ht 62.0 in | Wt 217.7 lb

## 2024-03-10 DIAGNOSIS — N898 Other specified noninflammatory disorders of vagina: Secondary | ICD-10-CM

## 2024-03-10 DIAGNOSIS — J301 Allergic rhinitis due to pollen: Secondary | ICD-10-CM

## 2024-03-10 DIAGNOSIS — D649 Anemia, unspecified: Secondary | ICD-10-CM

## 2024-03-10 DIAGNOSIS — Z0000 Encounter for general adult medical examination without abnormal findings: Secondary | ICD-10-CM

## 2024-03-10 NOTE — Telephone Encounter (Signed)
 This patient needs a refill on ferrous sulfate , cetirizine , and was wondering if you could increase her hydrochlorothiazide . Thanks!

## 2024-03-10 NOTE — Progress Notes (Signed)
 Subjective:   MEGA KINKADE is a 68 y.o. who presents for a Medicare Wellness preventive visit.  As a reminder, Annual Wellness Visits don't include a physical exam, and some assessments may be limited, especially if this visit is performed virtually. We may recommend an in-person follow-up visit with your provider if needed.  Visit Complete: In person  Persons Participating in Visit: Patient.  AWV Questionnaire: No: Patient Medicare AWV questionnaire was not completed prior to this visit.  Cardiac Risk Factors include: advanced age (>103men, >3 women);obesity (BMI >30kg/m2);dyslipidemia;smoking/ tobacco exposure     Objective:    Today's Vitals   03/10/24 0858  BP: 128/82  Weight: 217 lb 11.2 oz (98.7 kg)  Height: 5' 2 (1.575 m)   Body mass index is 39.82 kg/m.     03/10/2024    9:08 AM 03/05/2023    8:28 AM 08/21/2021    9:53 AM 01/25/2020   10:32 AM 12/07/2018    4:54 PM 07/13/2016    7:50 AM 02/10/2015    2:32 PM  Advanced Directives  Does Patient Have a Medical Advance Directive? No No No Yes No No  No   Type of Theme park manager;Living will     Copy of Healthcare Power of Attorney in Chart?    No - copy requested     Would patient like information on creating a medical advance directive? No - Patient declined    No - Patient declined   No - patient declined information;Yes - Educational materials given      Data saved with a previous flowsheet row definition    Current Medications (verified) Outpatient Encounter Medications as of 03/10/2024  Medication Sig   atorvastatin  (LIPITOR) 20 MG tablet Take 1 tablet (20 mg total) by mouth daily.   cetirizine  (ZYRTEC ) 10 MG tablet Take 1 tablet (10 mg total) by mouth every morning.   clobetasol  cream (TEMOVATE ) 0.05 % Apply 1 Application topically 2 (two) times daily.   ferrous sulfate  325 (65 FE) MG tablet TAKE 325 MG BY MOUTH DAILY.   furosemide  (LASIX ) 20 MG tablet Take 1 tablet (20 mg  total) by mouth daily as needed for edema.   hydrOXYzine  (ATARAX ) 10 MG tablet Take 1 tablet (10 mg total) by mouth 3 (three) times daily as needed.   ketoconazole  (NIZORAL ) 2 % cream Apply 1 Application topically daily.   montelukast  (SINGULAIR ) 10 MG tablet Take 1 tablet (10 mg total) by mouth daily.   triamcinolone  (KENALOG ) 0.025 % ointment Apply topically 2 (two) times daily.   Vitamin D , Ergocalciferol , (DRISDOL ) 1.25 MG (50000 UNIT) CAPS capsule Take 1 capsule (50,000 Units total) by mouth every 7 (seven) days.   aspirin  EC 81 MG tablet Take 81 mg by mouth. (Patient not taking: Reported on 03/10/2024)   benzonatate  (TESSALON ) 100 MG capsule Take 1 capsule (100 mg total) by mouth 3 (three) times daily as needed for cough. (Patient not taking: Reported on 03/10/2024)   ibuprofen  (ADVIL ) 800 MG tablet Take 1 tablet (800 mg total) by mouth at bedtime as needed. (Patient not taking: Reported on 03/10/2024)   Multiple Vitamins-Minerals (MULTIVITAMIN WOMEN 50+) TABS Take 1 tablet by mouth daily. (Patient not taking: Reported on 03/10/2024)   omeprazole  (PRILOSEC) 20 MG capsule Take 1 capsule (20 mg total) by mouth daily. (Patient not taking: Reported on 03/10/2024)   No facility-administered encounter medications on file as of 03/10/2024.    Allergies (verified) Oxycodone -acetaminophen , Tramadol, and Meloxicam  History: Past Medical History:  Diagnosis Date   Allergy    Arthritis    think I have it in both knees (05/06/2012)   Hyperlipidemia    Hypertension    PONV (postoperative nausea and vomiting)    Past Surgical History:  Procedure Laterality Date   COLONOSCOPY WITH PROPOFOL  N/A 07/13/2016   Procedure: COLONOSCOPY WITH PROPOFOL ;  Surgeon: Ruel Kung, MD;  Location: [facility] ENDOSCOPY;  Service: Endoscopy;  Laterality: N/A;   COLONOSCOPY WITH PROPOFOL  N/A 08/21/2021   Procedure: COLONOSCOPY WITH PROPOFOL ;  Surgeon: Kung Ruel, MD;  Location: [facility] ENDOSCOPY;  Service: Gastroenterology;   Laterality: N/A;   DILATION AND CURETTAGE OF UTERUS     ESOPHAGOGASTRODUODENOSCOPY N/A 08/21/2021   Procedure: ESOPHAGOGASTRODUODENOSCOPY (EGD);  Surgeon: Kung Ruel, MD;  Location: [facility] ENDOSCOPY;  Service: Gastroenterology;  Laterality: N/A;   JOINT REPLACEMENT     right knee   REPLACEMENT TOTAL KNEE  ~ 2008   right   TOTAL KNEE ARTHROPLASTY Left 02/10/2015   Procedure: TOTAL KNEE ARTHROPLASTY;  Surgeon: Ozell Flake, MD;  Location: [facility] ORS;  Service: Orthopedics;  Laterality: Left;   TOTAL KNEE REVISION  05/05/2012   Procedure: TOTAL KNEE REVISION;  Surgeon: Dempsey JINNY Sensor, MD;  Location: [facility] OR;  Service: Orthopedics;  Laterality: Right;   TOTAL THYROIDECTOMY  1990's   TUBAL LIGATION     Family History  Problem Relation Age of Onset   CAD Mother    Heart disease Mother    Colon cancer Father        60s   Heart attack Brother    Social History   Socioeconomic History   Marital status: Married    Spouse name: Not on file   Number of children: 2   Years of education: Not on file   Highest education level: High school graduate  Occupational History   Occupation: retired  Tobacco Use   Smoking status: Every Day    Current packs/day: 0.50    Average packs/day: 0.5 packs/day for 40.0 years (20.0 ttl pk-yrs)    Types: Cigarettes   Smokeless tobacco: Never  Vaping Use   Vaping status: Never Used  Substance and Sexual Activity   Alcohol use: Yes    Alcohol/week: 7.0 standard drinks of alcohol    Types: 7 Cans of beer per week    Comment: 1/day   Drug use: No   Sexual activity: Not Currently    Birth control/protection: Post-menopausal  Other Topics Concern   Not on file  Social History Narrative   Not on file   Social Drivers of Health   Financial Resource Strain: Low Risk  (03/10/2024)   Overall Financial Resource Strain (CARDIA)    Difficulty of Paying Living Expenses: Not hard at all  Food Insecurity: No Food Insecurity (03/10/2024)   Hunger Vital Sign    Worried  About Running Out of Food in the Last Year: Never true    Ran Out of Food in the Last Year: Never true  Transportation Needs: No Transportation Needs (03/10/2024)   PRAPARE - Administrator, Civil Service (Medical): No    Lack of Transportation (Non-Medical): No  Physical Activity: Insufficiently Active (03/10/2024)   Exercise Vital Sign    Days of Exercise per Week: 6 days    Minutes of Exercise per Session: 20 min  Stress: No Stress Concern Present (03/10/2024)   [employer] of Occupational Health - Occupational Stress Questionnaire    Feeling of Stress: Not at all  Social  Connections: Moderately Integrated (03/10/2024)   Social Connection and Isolation Panel    Frequency of Communication with Friends and Family: More than three times a week    Frequency of Social Gatherings with Friends and Family: More than three times a week    Attends Religious Services: More than 4 times per year    Active Member of [employer] or Organizations: No    Attends Engineer, structural: Never    Marital Status: Married    Tobacco Counseling Ready to quit: Not Answered Counseling given: Not Answered    Clinical Intake:  Pre-visit preparation completed: Yes  Pain : No/denies pain     BMI - recorded: 39.82 Nutritional Status: BMI > 30  Obese Nutritional Risks: None Diabetes: No  Lab Results  Component Value Date   HGBA1C 5.6 03/21/2020   HGBA1C 5.3 07/07/2018   HGBA1C 5.2 05/15/2016     How often do you need to have someone help you when you read instructions, pamphlets, or other written materials from your doctor or pharmacy?: 1 - Never  Interpreter Needed?: No  Information entered by :: JHONNIE DAS, LPN   Activities of Daily Living     03/10/2024    9:10 AM  In your present state of health, do you have any difficulty performing the following activities:  Hearing? 0  Vision? 0  Difficulty concentrating or making decisions? 0  Walking or climbing stairs?  0  Dressing or bathing? 0  Doing errands, shopping? 0  Preparing Food and eating ? N  Using the Toilet? N  In the past six months, have you accidently leaked urine? N  Do you have problems with loss of bowel control? N  Managing your Medications? N  Managing your Finances? N  Housekeeping or managing your Housekeeping? N    Patient Care Team: Donzella Lauraine SAILOR, DO as PCP - General (Family Medicine) Elma Zachary RAMAN (Optometry) Cathlyn Seal, MD (Dermatology) Myrla Jon HERO, MD as Consulting Physician [facility]) Laurice Francis NOVAK, OD (Optometry)  I have updated your Care Teams any recent Medical Services you may have received from other providers in the past year.     Assessment:   This is a routine wellness examination for [place].  Hearing/Vision screen Hearing Screening - Comments:: NO AIDS Vision Screening - Comments:: WEARS GLASSES - NICE EYECARE   Goals Addressed             This Visit's Progress    DIET - EAT MORE FRUITS AND VEGETABLES         Depression Screen     03/10/2024    9:07 AM 11/26/2023   11:44 AM 03/05/2023    8:26 AM 10/30/2022    1:28 PM 04/24/2022    8:54 AM 11/14/2021    9:46 AM 05/29/2021    1:58 PM  PHQ 2/9 Scores  PHQ - 2 Score 0 0 0 0 0 0 0  PHQ- 9 Score 0 0  0 0 0 0    Fall Risk     03/10/2024    9:09 AM 03/05/2023    8:19 AM 10/30/2022    1:28 PM 04/24/2022    8:54 AM 11/14/2021    9:46 AM  Fall Risk   Falls in the past year? 0 0 0 0 0  Number falls in past yr: 0 0 0 0 0  Injury with Fall? 0 0 0 0 0  Risk for fall due to : No Fall Risks No Fall Risks  No Fall Risks No Fall Risks No Fall Risks  Follow up Falls evaluation completed;Falls prevention discussed Falls prevention discussed;Education provided Falls evaluation completed Falls evaluation completed       Data saved with a previous flowsheet row definition    MEDICARE RISK AT HOME:  Medicare Risk at Home Any stairs in or around the home?: No If so, are  there any without handrails?: No Home free of loose throw rugs in walkways, pet beds, electrical cords, etc?: Yes Adequate lighting in your home to reduce risk of falls?: Yes Life alert?: No Use of a cane, walker or w/c?: No Grab bars in the bathroom?: No Shower chair or bench in shower?: No Elevated toilet seat or a handicapped toilet?: No  TIMED UP AND GO:  Was the test performed?  Yes  Length of time to ambulate 10 feet: 4 sec Gait steady and fast without use of assistive device  Cognitive Function: 6CIT completed        03/10/2024    9:15 AM 03/05/2023    8:40 AM 03/28/2021    9:08 AM 01/25/2020   10:50 AM  6CIT Screen  What Year? 0 points 0 points 0 points 0 points  What month? 0 points 0 points 0 points 0 points  What time? 0 points 0 points 0 points 0 points  Count back from 20 0 points 0 points 2 points 0 points  Months in reverse 4 points 0 points 4 points 4 points  Repeat phrase 2 points 0 points 0 points 4 points  Total Score 6 points 0 points 6 points 8 points    Immunizations Immunization History  Administered Date(s) Administered   Fluad Quad(high Dose 65+) 03/31/2019, 04/24/2022, 02/19/2023   Influenza,inj,Quad PF,6+ Mos 05/20/2017, 05/26/2018   Influenza-Unspecified 03/14/2021   Moderna Sars-Covid-2 Vaccination 08/01/2019, 08/29/2019, 04/11/2020   PNEUMOCOCCAL CONJUGATE-20 04/24/2022   Pfizer Covid-19 Vaccine Bivalent Booster 76yrs & up 02/19/2023   Pneumococcal Conjugate-13 01/25/2020   Pneumococcal Polysaccharide-23 04/06/2014   Td 05/28/2002   Tdap 11/09/2009   Zoster, Live 12/31/2013    Screening Tests Health Maintenance  Topic Date Due   Zoster Vaccines- Shingrix (1 of 2) 02/11/1973   DTaP/Tdap/Td (3 - Td or Tdap) 11/10/2019   Lung Cancer Screening  12/07/2019   Colonoscopy  08/22/2022   Influenza Vaccine  01/17/2024   COVID-19 Vaccine (5 - 2025-26 season) 02/17/2024   Mammogram  04/28/2024 (Originally 09/06/2018)   Medicare Annual Wellness  (AWV)  03/10/2025   Pneumococcal Vaccine: 50+ Years  Completed   DEXA SCAN  Completed   Hepatitis C Screening  Completed   HPV VACCINES  Aged Out   Meningococcal B Vaccine  Aged Out    Health Maintenance Items Addressed: NEEDS TDAP; DECLINES MAMMOGRAM, BDS & COLONOSCOPY  Additional Screening:  Vision Screening: Recommended annual ophthalmology exams for early detection of glaucoma and other disorders of the eye. Is the patient up to date with their annual eye exam?  Yes  Who is the provider or what is the name of the office in which the patient attends annual eye exams? [facility]  Dental Screening: Recommended annual dental exams for proper oral hygiene  Community Resource Referral / Chronic Care Management: CRR required this visit?  No   CCM required this visit?  No   Plan:    I have personally reviewed and noted the following in the patient's chart:   Medical and social history Use of alcohol, tobacco or illicit drugs  Current medications and supplements  including opioid prescriptions. Patient is not currently taking opioid prescriptions. Functional ability and status Nutritional status Physical activity Advanced directives List of other physicians Hospitalizations, surgeries, and ER visits in previous 12 months Vitals Screenings to include cognitive, depression, and falls Referrals and appointments  In addition, I have reviewed and discussed with patient certain preventive protocols, quality metrics, and best practice recommendations. A written personalized care plan for preventive services as well as general preventive health recommendations were provided to patient.   Jhonnie GORMAN Das, LPN   [id number]   After Visit Summary: (In Person-Declined) Patient declined AVS at this time.  Notes: Nothing significant to report at this time.

## 2024-03-10 NOTE — Patient Instructions (Addendum)
 Cathy Townsend,  Thank you for taking the time for your Medicare Wellness Visit. I appreciate your continued commitment to your health goals. Please review the care plan we discussed, and feel free to reach out if I can assist you further.  Medicare recommends these wellness visits once per year to help you and your care team stay ahead of potential health issues. These visits are designed to focus on prevention, allowing your provider to concentrate on managing your acute and chronic conditions during your regular appointments.  Please note that Annual Wellness Visits do not include a physical exam. Some assessments may be limited, especially if the visit was conducted virtually. If needed, we may recommend a separate in-person follow-up with your provider.  Ongoing Care Seeing your primary care provider every 3 to 6 months helps us  monitor your health and provide consistent, personalized care.   Referrals If a referral was made during today's visit and you haven't received any updates within two weeks, please contact the referred provider directly to check on the status.  Recommended Screenings:  Health Maintenance  Topic Date Due   Zoster (Shingles) Vaccine (1 of 2) 02/11/1973   DTaP/Tdap/Td vaccine (3 - Td or Tdap) 11/10/2019   Screening for Lung Cancer  12/07/2019   Colon Cancer Screening  08/22/2022   Flu Shot  01/17/2024   COVID-19 Vaccine (5 - 2025-26 season) 02/17/2024   Breast Cancer Screening  04/28/2024*   Medicare Annual Wellness Visit  03/10/2025   Pneumococcal Vaccine for age over 76  Completed   DEXA scan (bone density measurement)  Completed   Hepatitis C Screening  Completed   HPV Vaccine  Aged Out   Meningitis B Vaccine  Aged Out  *Topic was postponed. The date shown is not the original due date.       03/10/2024    9:08 AM  Advanced Directives  Does Patient Have a Medical Advance Directive? No  Would patient like information on creating a medical advance  directive? No - Patient declined   Advance Care Planning is important because it: Ensures you receive medical care that aligns with your values, goals, and preferences. Provides guidance to your family and loved ones, reducing the emotional burden of decision-making during critical moments.  Vision: Annual vision screenings are recommended for early detection of glaucoma, cataracts, and diabetic retinopathy. These exams can also reveal signs of chronic conditions such as diabetes and high blood pressure.  Dental: Annual dental screenings help detect early signs of oral cancer, gum disease, and other conditions linked to overall health, including heart disease and diabetes.  Please see the attached documents for additional preventive care recommendations.   NEXT AWV 03/16/25 @ 8:50 AM IN PERSON

## 2024-03-17 MED ORDER — HYDROXYZINE PAMOATE 25 MG PO CAPS: 25.0000 mg | ORAL_CAPSULE | Freq: Three times a day (TID) | ORAL | 0 refills | Status: DC | PRN

## 2024-03-17 MED ORDER — FERROUS SULFATE 325 (65 FE) MG PO TABS
325.0000 mg | ORAL_TABLET | Freq: Every day | ORAL | 3 refills | Status: AC
Start: 2024-03-17 — End: ?

## 2024-03-17 MED ORDER — CETIRIZINE HCL 10 MG PO TABS: 10.0000 mg | ORAL_TABLET | Freq: Every morning | ORAL | 3 refills | Status: AC

## 2024-03-17 NOTE — Addendum Note (Signed)
 Addended by: DONZELLA DOMINO on: 03/17/2024 08:12 AM   Modules accepted: Orders

## 2024-03-26 ENCOUNTER — Other Ambulatory Visit: Payer: Self-pay | Admitting: Family Medicine

## 2024-03-26 DIAGNOSIS — R600 Localized edema: Secondary | ICD-10-CM

## 2024-03-30 NOTE — Telephone Encounter (Signed)
 LOV- 04/29/2023 NOV- 05/04/2024 LRF- 01/21/2024 90 x 1 Last Labs- 04/29/2023

## 2024-04-13 ENCOUNTER — Ambulatory Visit (INDEPENDENT_AMBULATORY_CARE_PROVIDER_SITE_OTHER): Admitting: Podiatry

## 2024-04-13 DIAGNOSIS — Z91198 Patient's noncompliance with other medical treatment and regimen for other reason: Secondary | ICD-10-CM

## 2024-04-13 NOTE — Progress Notes (Signed)
 1. Failure to attend appointment with reason given    Appt rescheduled by patient.

## 2024-05-04 ENCOUNTER — Ambulatory Visit (INDEPENDENT_AMBULATORY_CARE_PROVIDER_SITE_OTHER): Admitting: Family Medicine

## 2024-05-04 ENCOUNTER — Encounter: Payer: Self-pay | Admitting: Family Medicine

## 2024-05-04 VITALS — BP 119/74 | HR 71 | Resp 16 | Ht 62.0 in | Wt 225.0 lb

## 2024-05-04 DIAGNOSIS — J301 Allergic rhinitis due to pollen: Secondary | ICD-10-CM

## 2024-05-04 DIAGNOSIS — E7800 Pure hypercholesterolemia, unspecified: Secondary | ICD-10-CM | POA: Diagnosis not present

## 2024-05-04 DIAGNOSIS — N182 Chronic kidney disease, stage 2 (mild): Secondary | ICD-10-CM

## 2024-05-04 DIAGNOSIS — Z0000 Encounter for general adult medical examination without abnormal findings: Secondary | ICD-10-CM | POA: Diagnosis not present

## 2024-05-04 DIAGNOSIS — Z6841 Body Mass Index (BMI) 40.0 and over, adult: Secondary | ICD-10-CM

## 2024-05-04 DIAGNOSIS — Z713 Dietary counseling and surveillance: Secondary | ICD-10-CM

## 2024-05-04 DIAGNOSIS — M1710 Unilateral primary osteoarthritis, unspecified knee: Secondary | ICD-10-CM | POA: Diagnosis not present

## 2024-05-04 MED ORDER — IBUPROFEN 800 MG PO TABS: 800.0000 mg | ORAL_TABLET | Freq: Every evening | ORAL | 0 refills | Status: AC | PRN

## 2024-05-04 MED ORDER — MONTELUKAST SODIUM 10 MG PO TABS: 10.0000 mg | ORAL_TABLET | Freq: Every day | ORAL | 3 refills | Status: AC

## 2024-05-04 MED ORDER — ATORVASTATIN CALCIUM 20 MG PO TABS: 20.0000 mg | ORAL_TABLET | Freq: Every day | ORAL | 3 refills | Status: AC

## 2024-05-04 MED ORDER — TOPIRAMATE 25 MG PO TABS: ORAL_TABLET | ORAL | 1 refills | Status: DC

## 2024-05-04 NOTE — Progress Notes (Signed)
 Complete physical exam   [name]   DOB: 01-Oct-1955   68 y.o. Female  MRN: [id number] Visit Date: 05/04/2024  Today's healthcare provider: LAURAINE LOISE BUOY, DO   Chief Complaint  Patient presents with   Annual Exam    CPE/ Wt loss    Subjective    Cathy Townsend is a 68 y.o. female who presents today for a complete physical exam.   HPI HPI     Annual Exam    Additional comments: CPE/ Wt loss       Last edited by Marylen Odella CROME, CMA on 05/04/2024  9:03 AM.       Cathy Townsend is a 68 year old female who presents for a physical exam and discussion of weight loss.  She is actively trying to lose weight through exercise and dietary modifications, such as avoiding fried foods and opting for baked options. Despite these efforts, she perceives weight gain. She previously consulted a nutritionist but found it unhelpful.  She experiences intermittent knee pain and occasionally uses ibuprofen  for relief, which she finds effective. She has not used it recently but considers it when the pain flares up.  She uses ketoconazole  cream for a skin condition, applying it as needed during flare-ups. She previously used clobetasol  cream but has not used it in over a year.  She denies current use of omeprazole  and reports no recent episodes of reflux. She is not taking atorvastatin  currently due to running out of the medication.  She exercises at home, performing squats and using a resistance band. She consumes alcohol regularly, drinking two to three beers almost daily.  She has received her flu and pneumonia vaccines but declined a colonoscopy due to difficulty with the preparation process. She has a history of polyps but has not pursued further screening.     Past Medical History:  Diagnosis Date   Allergy    Arthritis    think I have it in both knees (05/06/2012)   Hyperlipidemia    Hypertension    PONV (postoperative nausea and vomiting)    Past  Surgical History:  Procedure Laterality Date   COLONOSCOPY WITH PROPOFOL  N/A 07/13/2016   Procedure: COLONOSCOPY WITH PROPOFOL ;  Surgeon: Ruel Kung, MD;  Location: [facility] ENDOSCOPY;  Service: Endoscopy;  Laterality: N/A;   COLONOSCOPY WITH PROPOFOL  N/A 08/21/2021   Procedure: COLONOSCOPY WITH PROPOFOL ;  Surgeon: Kung Ruel, MD;  Location: [facility] ENDOSCOPY;  Service: Gastroenterology;  Laterality: N/A;   DILATION AND CURETTAGE OF UTERUS     ESOPHAGOGASTRODUODENOSCOPY N/A 08/21/2021   Procedure: ESOPHAGOGASTRODUODENOSCOPY (EGD);  Surgeon: Kung Ruel, MD;  Location: [facility] ENDOSCOPY;  Service: Gastroenterology;  Laterality: N/A;   JOINT REPLACEMENT     right knee   REPLACEMENT TOTAL KNEE  ~ 2008   right   TOTAL KNEE ARTHROPLASTY Left 02/10/2015   Procedure: TOTAL KNEE ARTHROPLASTY;  Surgeon: Ozell Flake, MD;  Location: [facility] ORS;  Service: Orthopedics;  Laterality: Left;   TOTAL KNEE REVISION  05/05/2012   Procedure: TOTAL KNEE REVISION;  Surgeon: Dempsey JINNY Sensor, MD;  Location: [facility] OR;  Service: Orthopedics;  Laterality: Right;   TOTAL THYROIDECTOMY  1990's   TUBAL LIGATION     Social History   Socioeconomic History   Marital status: Married    Spouse name: Not on file   Number of children: 2   Years of education: Not on file   Highest education level: High school graduate  Occupational History  Occupation: retired  Tobacco Use   Smoking status: Every Day    Current packs/day: 0.50    Average packs/day: 0.5 packs/day for 40.0 years (20.0 ttl pk-yrs)    Types: Cigarettes   Smokeless tobacco: Never  Vaping Use   Vaping status: Never Used  Substance and Sexual Activity   Alcohol use: Yes    Alcohol/week: 7.0 standard drinks of alcohol    Types: 7 Cans of beer per week    Comment: 1/day   Drug use: No   Sexual activity: Not Currently    Birth control/protection: Post-menopausal  Other Topics Concern   Not on file  Social History Narrative   Not on file   Social Drivers of Health    Financial Resource Strain: Low Risk  (03/10/2024)   Overall Financial Resource Strain (CARDIA)    Difficulty of Paying Living Expenses: Not hard at all  Food Insecurity: No Food Insecurity (03/10/2024)   Hunger Vital Sign    Worried About Running Out of Food in the Last Year: Never true    Ran Out of Food in the Last Year: Never true  Transportation Needs: No Transportation Needs (03/10/2024)   PRAPARE - Administrator, Civil Service (Medical): No    Lack of Transportation (Non-Medical): No  Physical Activity: Insufficiently Active (03/10/2024)   Exercise Vital Sign    Days of Exercise per Week: 6 days    Minutes of Exercise per Session: 20 min  Stress: No Stress Concern Present (03/10/2024)   [employer] of Occupational Health - Occupational Stress Questionnaire    Feeling of Stress: Not at all  Social Connections: Moderately Integrated (03/10/2024)   Social Connection and Isolation Panel    Frequency of Communication with Friends and Family: More than three times a week    Frequency of Social Gatherings with Friends and Family: More than three times a week    Attends Religious Services: More than 4 times per year    Active Member of [employer] or Organizations: No    Attends Banker Meetings: Never    Marital Status: Married  Catering Manager Violence: Not At Risk (03/10/2024)   Humiliation, Afraid, Rape, and Kick questionnaire    Fear of Current or Ex-Partner: No    Emotionally Abused: No    Physically Abused: No    Sexually Abused: No   Family Status  Relation Name Status   Mother  Alive   Father  Deceased at age 50   Sister  Deceased at age 56   Sister  Alive   Sister  Alive   Sister  Alive   Brother  Deceased at age 59   Brother  Deceased at age 56       gunshot wound   Daughter  Alive   Son  Alive  No partnership data on file   Family History  Problem Relation Age of Onset   CAD Mother    Heart disease Mother    Colon cancer Father         5s   Heart attack Brother    Allergies  Allergen Reactions   Oxycodone -Acetaminophen  Itching   Tramadol Itching   Meloxicam Rash    Patient Care Team: Grady Lucci, Lauraine SAILOR, DO as PCP - General (Family Medicine) Elma Zachary RAMAN (Optometry) Cathlyn Seal, MD (Dermatology) Myrla, Jon HERO, MD as Consulting Physician (Family Medicine) Laurice Francis NOVAK, OD (Optometry)   Medications: Outpatient Medications Prior to Visit  Medication Sig   amoxicillin  (  AMOXIL ) 500 MG capsule Take 2,000 mg by mouth as directed. 4 capsules 1 hour prior to dental appointment   cetirizine  (ZYRTEC ) 10 MG tablet Take 1 tablet (10 mg total) by mouth every morning.   doxepin (SINEQUAN) 25 MG capsule Take 50 mg by mouth at bedtime.   ferrous sulfate  325 (65 FE) MG tablet Take 1 tablet (325 mg total) by mouth daily with breakfast.   furosemide  (LASIX ) 20 MG tablet TAKE 1 TABLET (20 MG TOTAL) BY MOUTH DAILY AS NEEDED FOR EDEMA.   ketoconazole  (NIZORAL ) 2 % cream Apply 1 Application topically daily.   Multiple Vitamin (DAILY-VITE MULTIVITAMIN) TABS Take 1 tablet by mouth daily.   triamcinolone  (KENALOG ) 0.025 % ointment Apply topically 2 (two) times daily.   [DISCONTINUED] atorvastatin  (LIPITOR) 20 MG tablet Take 1 tablet (20 mg total) by mouth daily.   [DISCONTINUED] clobetasol  cream (TEMOVATE ) 0.05 % Apply 1 Application topically 2 (two) times daily.   [DISCONTINUED] hydrOXYzine  (ATARAX ) 10 MG tablet Take 1 tablet (10 mg total) by mouth 3 (three) times daily as needed.   [DISCONTINUED] hydrOXYzine  (VISTARIL ) 25 MG capsule Take 1 capsule (25 mg total) by mouth every 8 (eight) hours as needed for itching.   [DISCONTINUED] ibuprofen  (ADVIL ) 800 MG tablet Take 1 tablet (800 mg total) by mouth at bedtime as needed.   [DISCONTINUED] montelukast  (SINGULAIR ) 10 MG tablet Take 1 tablet (10 mg total) by mouth daily.   [DISCONTINUED] Multiple Vitamins-Minerals (MULTIVITAMIN WOMEN 50+) TABS Take 1 tablet by mouth  daily.   aspirin  EC 81 MG tablet Take 81 mg by mouth. (Patient not taking: Reported on 05/04/2024)   Vitamin D , Ergocalciferol , (DRISDOL ) 1.25 MG (50000 UNIT) CAPS capsule Take 1 capsule (50,000 Units total) by mouth every 7 (seven) days. (Patient not taking: Reported on 05/04/2024)   [DISCONTINUED] benzonatate  (TESSALON ) 100 MG capsule Take 1 capsule (100 mg total) by mouth 3 (three) times daily as needed for cough. (Patient not taking: Reported on 05/04/2024)   [DISCONTINUED] omeprazole  (PRILOSEC) 20 MG capsule Take 1 capsule (20 mg total) by mouth daily. (Patient not taking: Reported on 05/04/2024)   No facility-administered medications prior to visit.    Review of Systems  Constitutional:  Negative for chills, fatigue and fever.  HENT:  Negative for congestion, ear pain, rhinorrhea, sneezing and sore throat.   Eyes: Negative.  Negative for pain and redness.  Respiratory:  Negative for cough, shortness of breath and wheezing.   Cardiovascular:  Negative for chest pain and leg swelling.  Gastrointestinal:  Negative for abdominal pain, blood in stool, constipation, diarrhea and nausea.  Endocrine: Negative for polydipsia and polyphagia.  Genitourinary: Negative.  Negative for dysuria, flank pain, hematuria, pelvic pain, vaginal bleeding and vaginal discharge.  Musculoskeletal:  Negative for arthralgias, back pain, gait problem and joint swelling.  Skin:  Negative for rash.  Neurological: Negative.  Negative for dizziness, tremors, seizures, weakness, light-headedness, numbness and headaches.  Hematological:  Negative for adenopathy.  Psychiatric/Behavioral: Negative.  Negative for behavioral problems, confusion and dysphoric mood. The patient is not nervous/anxious and is not hyperactive.       Objective    BP 119/74 (BP Location: Left Arm, Patient Position: Sitting, Cuff Size: Normal)   Pulse 71   Resp 16   Ht 5' 2 (1.575 m)   Wt 225 lb (102.1 kg)   SpO2 98%   BMI 41.15 kg/m     Physical Exam Vitals and nursing note reviewed.  Constitutional:      General: She  is awake.     Appearance: Normal appearance.  HENT:     Head: Normocephalic and atraumatic.     Right Ear: Tympanic membrane, ear canal and external ear normal.     Left Ear: Tympanic membrane, ear canal and external ear normal.     Nose: Nose normal.     Mouth/Throat:     Mouth: Mucous membranes are moist.     Pharynx: Oropharynx is clear. No oropharyngeal exudate or posterior oropharyngeal erythema.  Eyes:     General: No scleral icterus.    Extraocular Movements: Extraocular movements intact.     Conjunctiva/sclera: Conjunctivae normal.     Pupils: Pupils are equal, round, and reactive to light.  Neck:     Thyroid : No thyromegaly or thyroid  tenderness.  Cardiovascular:     Rate and Rhythm: Normal rate and regular rhythm.     Pulses: Normal pulses.     Heart sounds: Normal heart sounds.  Pulmonary:     Effort: Pulmonary effort is normal. No tachypnea, bradypnea or respiratory distress.     Breath sounds: Normal breath sounds. No stridor. No wheezing, rhonchi or rales.  Abdominal:     General: Bowel sounds are normal. There is no distension.     Palpations: Abdomen is soft. There is no mass.     Tenderness: There is no abdominal tenderness. There is no guarding.     Hernia: No hernia is present.  Musculoskeletal:     Cervical back: Normal range of motion and neck supple.     Right lower leg: No edema.     Left lower leg: No edema.  Lymphadenopathy:     Cervical: No cervical adenopathy.  Skin:    General: Skin is warm and dry.  Neurological:     Mental Status: She is alert and oriented to person, place, and time. Mental status is at baseline.  Psychiatric:        Mood and Affect: Mood normal.        Behavior: Behavior normal.       Last depression screening scores    05/04/2024    9:18 AM 03/10/2024    9:07 AM 11/26/2023   11:44 AM  PHQ 2/9 Scores  PHQ - 2 Score 1 0 0  PHQ- 9  Score 1 0  0      Data saved with a previous flowsheet row definition   Last fall risk screening    05/04/2024    9:17 AM  Fall Risk   Falls in the past year? 1  Number falls in past yr: 0  Injury with Fall? 0   Last Audit-C alcohol use screening    05/04/2024    9:41 AM  Alcohol Use Disorder Test (AUDIT)  1. How often do you have a drink containing alcohol? 4  2. How many drinks containing alcohol do you have on a typical day when you are drinking? 0  3. How often do you have six or more drinks on one occasion? 0  AUDIT-C Score 4  4. How often during the last year have you found that you were not able to stop drinking once you had started? 0  5. How often during the last year have you failed to do what was normally expected from you because of drinking? 0  6. How often during the last year have you needed a first drink in the morning to get yourself going after a heavy drinking session? 0  7. How often during  the last year have you had a feeling of guilt of remorse after drinking? 0  8. How often during the last year have you been unable to remember what happened the night before because you had been drinking? 0  9. Have you or someone else been injured as a result of your drinking? 0  10. Has a relative or friend or a doctor or another health worker been concerned about your drinking or suggested you cut down? 0  Alcohol Use Disorder Identification Test Final Score (AUDIT) 4   A score of 3 or more in women, and 4 or more in men indicates increased risk for alcohol abuse, EXCEPT if all of the points are from question 1   No results found for any visits on 05/04/24.  Assessment & Plan    Routine Health Maintenance and Physical Exam  Exercise Activities and Dietary recommendations  Goals      DIET - EAT MORE FRUITS AND VEGETABLES        Immunization History  Administered Date(s) Administered   Fluad Quad(high Dose 65+) 03/31/2019, 04/24/2022, 02/19/2023    Influenza,inj,Quad PF,6+ Mos 05/20/2017, 05/26/2018   Influenza-Unspecified 03/14/2021   Moderna Sars-Covid-2 Vaccination 08/01/2019, 08/29/2019, 04/11/2020   PNEUMOCOCCAL CONJUGATE-20 04/24/2022   Pfizer Covid-19 Vaccine Bivalent Booster 54yrs & up 02/19/2023   Pneumococcal Conjugate-13 01/25/2020   Pneumococcal Polysaccharide-23 04/06/2014   Td 05/28/2002   Tdap 11/09/2009   Zoster, Live 12/31/2013    Health Maintenance  Topic Date Due   COVID-19 Vaccine (8 - 2025-26 season) 05/20/2024 (Originally 02/17/2024)   Lung Cancer Screening  05/04/2025 (Originally 12/07/2019)   DTaP/Tdap/Td (3 - Td or Tdap) 05/04/2025 (Originally 11/10/2019)   Mammogram  05/04/2025 (Originally 09/06/2018)   Colonoscopy  05/04/2025 (Originally 08/22/2022)   Medicare Annual Wellness (AWV)  03/10/2025   Pneumococcal Vaccine: 50+ Years  Completed   Influenza Vaccine  Completed   DEXA SCAN  Completed   Hepatitis C Screening  Completed   Zoster Vaccines- Shingrix  Completed   Meningococcal B Vaccine  Aged Out    Discussed health benefits of physical activity, and encouraged her to engage in regular exercise appropriate for her age and condition.   Annual physical exam  Primary osteoarthritis of knee, unspecified laterality -     Ibuprofen ; Take 1 tablet (800 mg total) by mouth at bedtime as needed.  Dispense: 30 tablet; Refill: 0  Hypercholesterolemia -     Atorvastatin  Calcium ; Take 1 tablet (20 mg total) by mouth daily.  Dispense: 90 tablet; Refill: 3 -     Lipid panel  Seasonal allergic rhinitis due to pollen -     Montelukast  Sodium; Take 1 tablet (10 mg total) by mouth daily.  Dispense: 90 tablet; Refill: 3  Morbid obesity with BMI of 40.0-44.9, adult (HCC) -     Topiramate; Week 1: Take 1 tablet daily.  Week 2: Take 2 tablets daily.  Week 3: Take 3 tablets daily.  Week 4: Take 4 tablets daily.  If unable to tolerate higher dose, stay at maximum tolerated dose.  Dispense: 120 tablet; Refill: 1  Weight  loss counseling, encounter for -     Topiramate; Week 1: Take 1 tablet daily.  Week 2: Take 2 tablets daily.  Week 3: Take 3 tablets daily.  Week 4: Take 4 tablets daily.  If unable to tolerate higher dose, stay at maximum tolerated dose.  Dispense: 120 tablet; Refill: 1  Chronic kidney disease, stage 2, mildly decreased GFR -  Microalbumin / creatinine urine ratio -     Comprehensive metabolic panel with GFR      Annual physical exam Wellness visit with no acute concerns. Physical exam overall unremarkable except as noted above. Routine lab work ordered as noted. Discussed alcohol consumption and recommended limiting intake. Declined mammogram, new shingles vaccine, and lung cancer screening. Received flu shot, pneumonia shot, and shingles vaccine. Declined colonoscopy due to preparation difficulty. - Ordered blood work. - Requested [place] Prior to send vaccine records. - Advised to limit alcohol intake to one or two drinks per day.  Morbid obesity with BMI 40.0-44.9; weight loss counseling, encounter for Ongoing weight management with exercise and dietary changes. Previous nutritionist consultation not beneficial. Discussed topiramate for appetite suppression, advised morning intake. Naltrexone not recommended due to alcohol use. - Prescribed topiramate for appetite suppression. - Advised to take topiramate in the morning. - Encouraged reduction of caloric intake and continued physical activity.  Hypercholesterolemia Previously on atorvastatin , unintentionally stopped. No current cholesterol medication. - Prescribed atorvastatin .  Chronic kidney disease, stage 2, mildly decreased GFR Noted.  Check uACR and CMP today.  Continue to optimize risk factors.  Primary osteoarthritis of knee, unspecified laterality Intermittent knee pain managed with ibuprofen  as needed. - Prescribed ibuprofen  for knee pain as needed.    Return in about 3 months (around 08/04/2024) for Weight,  Chronic f/u.     I discussed the assessment and treatment plan with the patient  The patient was provided an opportunity to ask questions and all were answered. The patient agreed with the plan and demonstrated an understanding of the instructions.   The patient was advised to call back or seek an in-person evaluation if the symptoms worsen or if the condition fails to improve as anticipated.    LAURAINE LOISE BUOY, DO  [facility] [phone] (phone) [phone] (fax)  [facility]

## 2024-05-25 ENCOUNTER — Ambulatory Visit: Admitting: Podiatry

## 2024-05-25 ENCOUNTER — Encounter: Payer: Self-pay | Admitting: Podiatry

## 2024-05-25 DIAGNOSIS — M79674 Pain in right toe(s): Secondary | ICD-10-CM | POA: Diagnosis not present

## 2024-05-25 DIAGNOSIS — M79675 Pain in left toe(s): Secondary | ICD-10-CM | POA: Diagnosis not present

## 2024-05-25 DIAGNOSIS — B351 Tinea unguium: Secondary | ICD-10-CM | POA: Diagnosis not present

## 2024-05-25 NOTE — Progress Notes (Signed)
  Subjective:  Patient ID: Cathy Townsend, female    DOB: [birth year],  MRN: [id number]  68 y.o. female presents to clinic with  preventative diabetic foot care for painful thick toenails that are difficult to trim. Pain interferes with ambulation. Aggravating factors include wearing enclosed shoe gear. Pain is relieved with periodic professional debridement.  Chief Complaint  Patient presents with   [facility]    Rm3 Routine foot care/ Dr. Lauraine Buoy last visit May 04, 2024     New problem(s): None   PCP is Buoy Lauraine SAILOR, DO.  Allergies  Allergen Reactions   Oxycodone -Acetaminophen  Itching   Tramadol Itching   Meloxicam Rash    Review of Systems: Negative except as noted in the HPI.   Objective:  Cathy Townsend is a pleasant 68 y.o. female obese in NAD. AAO x 3.  Vascular Examination: Vascular status intact b/l with palpable pedal pulses. Pedal hair sparse. CFT immediate b/l. No edema. No pain with calf compression b/l. Skin temperature gradient WNL b/l.   Neurological Examination: Sensation grossly intact b/l with 10 gram monofilament. Vibratory sensation intact b/l.   Dermatological Examination: Pedal skin with normal turgor, texture and tone b/l. Toenails 2-5 b/l thick, discolored, elongated with subungual debris and pain on dorsal palpation. No hyperkeratotic lesions noted b/l. Anonychia noted bilateral great toes. Nailbed(s) epithelialized.   Musculoskeletal Examination: Muscle strength 5/5 to b/l LE. No pain, crepitus or joint limitation noted with ROM bilateral LE. No gross bony deformities bilaterally.  Radiographs: None   Assessment:   1. Pain due to onychomycosis of toenails of both feet    Plan:  -Consent given for treatment as described below: -Examined patient. -Patient to continue soft, supportive shoe gear daily. -Mycotic toenails 2-5 bilaterally were debrided in length and girth with sterile nail nippers and dremel without iatrogenic  bleeding. -Patient/POA to call should there be question/concern in the interim.  Return in about 3 months (around 08/23/2024).  Cathy Townsend, DPM      [place] LOCATION: [address] [place] [address]                   Office [phone]   [facility] LOCATION: [address] [place], [place] [address] Office [phone]

## 2024-06-08 NOTE — Progress Notes (Signed)
 Cathy Townsend                                          MRN: [id number]   06/08/2024   The [facility] Quality Team Specialist reviewed this patient medical record for the purposes of chart review for care gap closure. The following were reviewed: chart review for care gap closure-breast cancer screening.    [facility] Quality Team

## 2024-07-01 ENCOUNTER — Other Ambulatory Visit: Payer: Self-pay | Admitting: Family Medicine

## 2024-07-01 DIAGNOSIS — Z713 Dietary counseling and surveillance: Secondary | ICD-10-CM

## 2024-07-01 NOTE — Telephone Encounter (Signed)
 Copied from CRM #[id number]. Topic: Clinical - Medication Refill >> Jul 01, 2024  9:36 AM Winona R wrote: Medication:  topiramate  (TOPAMAX ) 25 MG tablet   Has the patient contacted their pharmacy? No (Agent: If no, request that the patient contact the pharmacy for the refill. If patient does not wish to contact the pharmacy document the reason why and proceed with request.) (Agent: If yes, when and what did the pharmacy advise?)  This is the patient's preferred pharmacy:  [id number] - [place], [place] - [address] [place] [address] Phone: [phone] Fax: [phone]  Is this the correct pharmacy for this prescription? Yes If no, delete pharmacy and type the correct one.   Has the prescription been filled recently? Yes  Is the patient out of the medication? No pt has enough to last her through the week   Has the patient been seen for an appointment in the last year OR does the patient have an upcoming appointment? Yes  Can we respond through MyChart? No, phone   Agent: Please be advised that Rx refills may take up to 3 business days. We ask that you follow-up with your pharmacy.

## 2024-07-02 NOTE — Telephone Encounter (Signed)
 LOV- 05/04/2024 NOV- None LRF- 05/04/2024 Outpatient Medication Detail   Disp Refills Start End   topiramate  (TOPAMAX ) 25 MG tablet 120 tablet 1 05/04/2024 --   Sig: Week 1: Take 1 tablet daily.  Week 2: Take 2 tablets daily.  Week 3: Take 3 tablets daily.  Week 4: Take 4 tablets daily.  If unable to tolerate higher dose, stay at maximum tolerated dose.   Sent to pharmacy as: topiramate  (TOPAMAX ) 25 MG tablet   E-Prescribing Status: Receipt confirmed by pharmacy (05/04/2024  9:48 AM EST)

## 2024-07-02 NOTE — Telephone Encounter (Signed)
 Requested medication (s) are due for refill today -yes  Requested medication (s) are on the active medication list -yes  Future visit scheduled -no  Last refill: 05/04/24 #120 1RF  Notes to clinic: fails lab protocol- over 1 year-04/29/23  Requested Prescriptions  Pending Prescriptions Disp Refills   topiramate  (TOPAMAX ) 25 MG tablet 120 tablet 1    Sig: Week 1: Take 1 tablet daily.  Week 2: Take 2 tablets daily.  Week 3: Take 3 tablets daily.  Week 4: Take 4 tablets daily.  If unable to tolerate higher dose, stay at maximum tolerated dose.     Neurology: Anticonvulsants - topiramate  & zonisamide Failed - 07/02/2024 12:10 PM      Failed - Cr in normal range and within 360 days    Creatinine, Ser  Date Value Ref Range Status  04/29/2023 0.73 0.57 - 1.00 mg/dL Final         Failed - CO2 in normal range and within 360 days    CO2  Date Value Ref Range Status  04/29/2023 25 20 - 29 mmol/L Final         Failed - ALT in normal range and within 360 days    ALT  Date Value Ref Range Status  04/29/2023 14 0 - 32 IU/L Final         Failed - AST in normal range and within 360 days    AST  Date Value Ref Range Status  04/29/2023 20 0 - 40 IU/L Final         Passed - Completed PHQ-2 or PHQ-9 in the last 360 days      Passed - Valid encounter within last 12 months    Recent Outpatient Visits           1 month ago Annual physical exam   [facility] Donzella Lauraine SAILOR, DO   7 months ago Vaginal itching   [place] [facility] [place], [place], PA-C                 Requested Prescriptions  Pending Prescriptions Disp Refills   topiramate  (TOPAMAX ) 25 MG tablet 120 tablet 1    Sig: Week 1: Take 1 tablet daily.  Week 2: Take 2 tablets daily.  Week 3: Take 3 tablets daily.  Week 4: Take 4 tablets daily.  If unable to tolerate higher dose, stay at maximum tolerated dose.     Neurology: Anticonvulsants - topiramate  & zonisamide Failed -  07/02/2024 12:10 PM      Failed - Cr in normal range and within 360 days    Creatinine, Ser  Date Value Ref Range Status  04/29/2023 0.73 0.57 - 1.00 mg/dL Final         Failed - CO2 in normal range and within 360 days    CO2  Date Value Ref Range Status  04/29/2023 25 20 - 29 mmol/L Final         Failed - ALT in normal range and within 360 days    ALT  Date Value Ref Range Status  04/29/2023 14 0 - 32 IU/L Final         Failed - AST in normal range and within 360 days    AST  Date Value Ref Range Status  04/29/2023 20 0 - 40 IU/L Final         Passed - Completed PHQ-2 or PHQ-9 in the last 360 days      Passed -  Valid encounter within last 12 months    Recent Outpatient Visits           1 month ago Annual physical exam   [facility] [place], Lauraine SAILOR, DO   7 months ago Vaginal itching   [place] [facility] [place], Janna, PA-C

## 2024-07-06 ENCOUNTER — Ambulatory Visit: Admitting: Physician Assistant

## 2024-07-06 MED ORDER — TOPIRAMATE 25 MG PO TABS: ORAL_TABLET | ORAL | 1 refills | Status: AC

## 2024-07-07 ENCOUNTER — Ambulatory Visit: Admitting: Family Medicine

## 2024-07-07 ENCOUNTER — Encounter: Payer: Self-pay | Admitting: Family Medicine

## 2024-07-07 VITALS — BP 104/58 | HR 85 | Temp 98.4°F | Ht 62.0 in | Wt 210.8 lb

## 2024-07-07 DIAGNOSIS — R6889 Other general symptoms and signs: Secondary | ICD-10-CM | POA: Diagnosis not present

## 2024-07-07 MED ORDER — BENZONATATE 200 MG PO CAPS: 200.0000 mg | ORAL_CAPSULE | Freq: Two times a day (BID) | ORAL | 0 refills | Status: AC | PRN

## 2024-07-07 NOTE — Progress Notes (Signed)
 "     Acute visit   [name]   DOB: 02-Jul-1955   69 y.o. Female  MRN: [id number] PCP: Donzella Lauraine SAILOR, DO   Chief Complaint  Patient presents with   Acute Visit    Body aches, congestion, cough x 1 week with runny nose last 3 days worse. Otc: mucinex D   Subjective    Discussed the use of AI scribe software for clinical note transcription with the patient, who gave verbal consent to proceed.  History of Present Illness   Cathy Townsend is a 68 year old female who presents with upper respiratory symptoms including nasal congestion and cough.  Her symptoms started last week with profuse rhinorrhea, then myalgias, dysphonia, aural fullness, and worsening nasal congestion. She denies fever. She has taken Mucinex D since Friday without relief and recalls prior benefit from prescription cough medicine. Her cough is productive with chest pain and mild dyspnea that is worse when she coughs. She is not using other medications.        Review of Systems  Objective    BP (!) 104/58   Pulse 85   Temp 98.4 F (36.9 C) (Oral)   Ht 5' 2 (1.575 m)   Wt 210 lb 12.8 oz (95.6 kg)   SpO2 100%   BMI 38.56 kg/m  Physical Exam Vitals reviewed.  Constitutional:      General: She is not in acute distress.    Appearance: Normal appearance. She is well-developed. She is not diaphoretic.  HENT:     Head: Normocephalic and atraumatic.     Right Ear: Tympanic membrane, ear canal and external ear normal.     Left Ear: Tympanic membrane, ear canal and external ear normal.     Nose: Congestion present.     Mouth/Throat:     Mouth: Mucous membranes are moist.     Pharynx: Oropharynx is clear. No oropharyngeal exudate.  Eyes:     General: No scleral icterus.    Conjunctiva/sclera: Conjunctivae normal.     Pupils: Pupils are equal, round, and reactive to light.  Cardiovascular:     Rate and Rhythm: Normal rate and regular rhythm.     Heart sounds: Murmur heard.  Pulmonary:      Effort: Pulmonary effort is normal. No respiratory distress.     Breath sounds: Normal breath sounds. No wheezing or rales.  Musculoskeletal:     Cervical back: Neck supple.  Lymphadenopathy:     Cervical: No cervical adenopathy.  Skin:    General: Skin is warm and dry.  Neurological:     Mental Status: She is alert.       No results found for any visits on 07/07/24.  Assessment & Plan     Problem List Items Addressed This Visit   None Visit Diagnoses       Flu-like symptoms    -  Primary           Acute upper respiratory infection Symptoms consistent with an acute upper respiratory infection, including rhinorrhea, pharyngitis, myalgia, congestion, and productive cough. No fever reported. Symptoms have persisted for approximately one week. Differential diagnosis includes influenza due to the presence of myalgia and congestion. Lungs are clear on examination, indicating no lower respiratory involvement. The condition is likely viral, and treatment is symptomatic. - Prescribed Tessalon  Perles for cough, to be taken twice daily. - Continue Mucinex if it provides relief, otherwise discontinue. - Advised rest and hydration to support recovery. -  Expect symptom resolution in 7-10 days, with potential for lingering cough.       Meds ordered this encounter  Medications   benzonatate  (TESSALON ) 200 MG capsule    Sig: Take 1 capsule (200 mg total) by mouth 2 (two) times daily as needed for cough.    Dispense:  30 capsule    Refill:  0     Return if symptoms worsen or fail to improve.      Jon Eva, MD  [facility] [phone] (phone) [phone] (fax)  [facility]  "

## 2024-07-13 ENCOUNTER — Telehealth: Payer: Self-pay | Admitting: Family Medicine

## 2024-07-13 DIAGNOSIS — R051 Acute cough: Secondary | ICD-10-CM

## 2024-07-13 NOTE — Telephone Encounter (Unsigned)
 Copied from CRM #[id number]. Topic: Clinical - Medication Question >> Jul 13, 2024  8:27 AM Cathy Townsend wrote: Reason for CRM: Pt says she is missing cough syrup discussed during recent visit. Says she wants this called in by Dr. Donzella and NOT the provider she just saw. Says the pearls are not helping.   [facility] [id number] [place] Cathy Townsend, Cathy Townsend - [address] [place]. Cathy Townsend Cathy Townsend Phone: [phone] Fax: [phone]

## 2024-07-14 ENCOUNTER — Other Ambulatory Visit: Payer: Self-pay | Admitting: Family Medicine

## 2024-07-14 DIAGNOSIS — R051 Acute cough: Secondary | ICD-10-CM

## 2024-07-14 MED ORDER — HYDROCOD POLI-CHLORPHE POLI ER 10-8 MG/5ML PO SUER: 5.0000 mL | Freq: Two times a day (BID) | ORAL | 0 refills | Status: DC | PRN

## 2024-07-14 NOTE — Telephone Encounter (Signed)
 Copied from CRM #[id number]. Topic: Clinical - Medication Refill >> Jul 14, 2024 12:11 PM Zebedee SAUNDERS wrote: Medication: chlorpheniramine-HYDROcodone  (TUSSIONEX) 10-8 MG/5ML  Has the patient contacted their pharmacy? Yes (Agent: If no, request that the patient contact the pharmacy for the refill. If patient does not wish to contact the pharmacy document the reason why and proceed with request.) (Agent: If yes, when and what did the pharmacy advise?)Pharmacy need script  This is the patient's preferred pharmacy:   [facility] [id number] [place], [place] - [address] [place] [address] Phone: [phone] Fax: [phone]  Is this the correct pharmacy for this prescription? Yes If no, delete pharmacy and type the correct one.   Has the prescription been filled recently? Yes  Is the patient out of the medication? Yes  Has the patient been seen for an appointment in the last year OR does the patient have an upcoming appointment? Yes  Can we respond through MyChart? Yes  Agent: Please be advised that Rx refills may take up to 3 business days. We ask that you follow-up with your pharmacy.

## 2024-07-14 NOTE — Telephone Encounter (Signed)
 Advised

## 2024-07-15 NOTE — Telephone Encounter (Signed)
 This was filled on 07/14/2024 however I do not have authorization to refuse the medication.

## 2024-07-15 NOTE — Telephone Encounter (Signed)
 Requested medication (s) are due for refill today: needing sent to different pharmacy  Requested medication (s) are on the active medication list: yes  Last refill:    Future visit scheduled: yes  Notes to clinic:  Unable to refill per protocol, medication not assigned to the refill protocol.      Requested Prescriptions  Pending Prescriptions Disp Refills   chlorpheniramine-HYDROcodone  (TUSSIONEX) 10-8 MG/5ML 115 mL 0    Sig: Take 5 mLs by mouth every 12 (twelve) hours as needed for cough.     Off-Protocol Failed - 07/15/2024 12:14 PM      Failed - Medication not assigned to a protocol, review manually.      Passed - Valid encounter within last 12 months    Recent Outpatient Visits           1 week ago Flu-like symptoms   [facility] [place], Jon HERO, MD   2 months ago Annual physical exam   [facility] [place], Lauraine SAILOR, DO   7 months ago Vaginal itching   [facility] [place], Janna, PA-C

## 2024-07-16 ENCOUNTER — Other Ambulatory Visit: Payer: Self-pay | Admitting: Family Medicine

## 2024-07-16 DIAGNOSIS — Z0000 Encounter for general adult medical examination without abnormal findings: Secondary | ICD-10-CM

## 2024-07-17 MED ORDER — HYDROCOD POLI-CHLORPHE POLI ER 10-8 MG/5ML PO SUER: 5.0000 mL | Freq: Two times a day (BID) | ORAL | 0 refills | Status: AC | PRN

## 2024-08-24 ENCOUNTER — Ambulatory Visit: Admitting: Podiatry

## 2025-03-16 ENCOUNTER — Ambulatory Visit
